# Patient Record
Sex: Male | Born: 1950
Health system: Southern US, Community
[De-identification: ages and names within clinical notes are randomized; demographics above are authoritative.]

## PROBLEM LIST (undated history)

## (undated) DIAGNOSIS — K219 Gastro-esophageal reflux disease without esophagitis: Secondary | ICD-10-CM

## (undated) DIAGNOSIS — I340 Nonrheumatic mitral (valve) insufficiency: Secondary | ICD-10-CM

## (undated) DIAGNOSIS — Z9289 Personal history of other medical treatment: Secondary | ICD-10-CM

## (undated) DIAGNOSIS — E7800 Pure hypercholesterolemia, unspecified: Secondary | ICD-10-CM

## (undated) DIAGNOSIS — M549 Dorsalgia, unspecified: Secondary | ICD-10-CM

## (undated) DIAGNOSIS — I341 Nonrheumatic mitral (valve) prolapse: Secondary | ICD-10-CM

## (undated) DIAGNOSIS — D72819 Decreased white blood cell count, unspecified: Secondary | ICD-10-CM

## (undated) DIAGNOSIS — B1710 Acute hepatitis C without hepatic coma: Secondary | ICD-10-CM

## (undated) DIAGNOSIS — M797 Fibromyalgia: Secondary | ICD-10-CM

## (undated) DIAGNOSIS — I4892 Unspecified atrial flutter: Secondary | ICD-10-CM

## (undated) DIAGNOSIS — N4281 Prostatodynia syndrome: Secondary | ICD-10-CM

## (undated) DIAGNOSIS — Z9889 Other specified postprocedural states: Secondary | ICD-10-CM

## (undated) DIAGNOSIS — M659 Synovitis and tenosynovitis, unspecified: Secondary | ICD-10-CM

## (undated) DIAGNOSIS — J310 Chronic rhinitis: Secondary | ICD-10-CM

## (undated) DIAGNOSIS — N419 Inflammatory disease of prostate, unspecified: Secondary | ICD-10-CM

## (undated) HISTORY — DX: Synovitis and tenosynovitis, unspecified: M65.9

## (undated) HISTORY — DX: Fibromyalgia: M79.7

## (undated) HISTORY — DX: Chronic rhinitis: J31.0

## (undated) HISTORY — DX: Gastro-esophageal reflux disease without esophagitis: K21.9

## (undated) HISTORY — DX: Dorsalgia, unspecified: M54.9

## (undated) HISTORY — DX: Nonrheumatic mitral (valve) prolapse: I34.1

## (undated) HISTORY — DX: Prostatodynia syndrome: N42.81

## (undated) HISTORY — DX: Inflammatory disease of prostate, unspecified: N41.9

## (undated) HISTORY — DX: Nonrheumatic mitral (valve) insufficiency: I34.0

## (undated) HISTORY — DX: Decreased white blood cell count, unspecified: D72.819

## (undated) HISTORY — PX: APPENDECTOMY: SHX54

## (undated) HISTORY — DX: Acute hepatitis C without hepatic coma: B17.10

## (undated) HISTORY — DX: Pure hypercholesterolemia, unspecified: E78.00

## (undated) HISTORY — DX: Unspecified atrial flutter: I48.92

## (undated) HISTORY — DX: Personal history of other medical treatment: Z92.89

---

## 1999-11-06 ENCOUNTER — Encounter: Admission: RE | Admit: 1999-11-06 | Discharge: 1999-11-06 | Payer: Self-pay | Admitting: Family Medicine

## 1999-11-26 ENCOUNTER — Encounter: Admission: RE | Admit: 1999-11-26 | Discharge: 1999-11-26 | Payer: Self-pay | Admitting: Family Medicine

## 2000-01-02 ENCOUNTER — Encounter: Admission: RE | Admit: 2000-01-02 | Discharge: 2000-01-02 | Payer: Self-pay | Admitting: Family Medicine

## 2000-02-03 ENCOUNTER — Encounter: Admission: RE | Admit: 2000-02-03 | Discharge: 2000-02-03 | Payer: Self-pay | Admitting: Family Medicine

## 2000-05-06 ENCOUNTER — Encounter: Admission: RE | Admit: 2000-05-06 | Discharge: 2000-05-06 | Payer: Self-pay | Admitting: Family Medicine

## 2000-07-26 ENCOUNTER — Encounter: Admission: RE | Admit: 2000-07-26 | Discharge: 2000-07-26 | Payer: Self-pay | Admitting: Family Medicine

## 2001-05-25 ENCOUNTER — Encounter: Admission: RE | Admit: 2001-05-25 | Discharge: 2001-05-25 | Payer: Self-pay | Admitting: Family Medicine

## 2001-06-20 ENCOUNTER — Encounter: Admission: RE | Admit: 2001-06-20 | Discharge: 2001-06-20 | Payer: Self-pay | Admitting: Family Medicine

## 2001-07-21 ENCOUNTER — Encounter: Admission: RE | Admit: 2001-07-21 | Discharge: 2001-07-21 | Payer: Self-pay | Admitting: Family Medicine

## 2001-07-21 ENCOUNTER — Ambulatory Visit (HOSPITAL_COMMUNITY): Admission: RE | Admit: 2001-07-21 | Discharge: 2001-07-21 | Payer: Self-pay | Admitting: Family Medicine

## 2001-08-01 ENCOUNTER — Encounter: Admission: RE | Admit: 2001-08-01 | Discharge: 2001-08-01 | Payer: Self-pay | Admitting: Family Medicine

## 2001-08-03 ENCOUNTER — Ambulatory Visit (HOSPITAL_COMMUNITY): Admission: RE | Admit: 2001-08-03 | Discharge: 2001-08-03 | Payer: Self-pay | Admitting: *Deleted

## 2001-08-03 ENCOUNTER — Encounter: Admission: RE | Admit: 2001-08-03 | Discharge: 2001-08-03 | Payer: Self-pay | Admitting: Family Medicine

## 2001-08-04 ENCOUNTER — Encounter: Admission: RE | Admit: 2001-08-04 | Discharge: 2001-08-04 | Payer: Self-pay | Admitting: Family Medicine

## 2001-08-22 ENCOUNTER — Encounter: Admission: RE | Admit: 2001-08-22 | Discharge: 2001-08-22 | Payer: Self-pay | Admitting: Family Medicine

## 2001-08-24 ENCOUNTER — Encounter: Admission: RE | Admit: 2001-08-24 | Discharge: 2001-08-24 | Payer: Self-pay | Admitting: Family Medicine

## 2003-04-26 ENCOUNTER — Encounter: Admission: RE | Admit: 2003-04-26 | Discharge: 2003-04-26 | Payer: Self-pay | Admitting: Family Medicine

## 2003-05-14 ENCOUNTER — Encounter: Admission: RE | Admit: 2003-05-14 | Discharge: 2003-05-14 | Payer: Self-pay | Admitting: Family Medicine

## 2003-05-14 ENCOUNTER — Ambulatory Visit (HOSPITAL_COMMUNITY): Admission: RE | Admit: 2003-05-14 | Discharge: 2003-05-14 | Payer: Self-pay | Admitting: Family Medicine

## 2003-07-03 ENCOUNTER — Encounter: Payer: Self-pay | Admitting: Gastroenterology

## 2003-07-03 ENCOUNTER — Ambulatory Visit (HOSPITAL_COMMUNITY): Admission: RE | Admit: 2003-07-03 | Discharge: 2003-07-03 | Payer: Self-pay | Admitting: Gastroenterology

## 2004-05-28 ENCOUNTER — Encounter: Admission: RE | Admit: 2004-05-28 | Discharge: 2004-05-28 | Payer: Self-pay | Admitting: Family Medicine

## 2004-06-04 ENCOUNTER — Encounter: Admission: RE | Admit: 2004-06-04 | Discharge: 2004-06-04 | Payer: Self-pay | Admitting: Family Medicine

## 2004-10-10 ENCOUNTER — Ambulatory Visit: Payer: Self-pay | Admitting: Family Medicine

## 2004-10-20 ENCOUNTER — Ambulatory Visit: Payer: Self-pay | Admitting: Family Medicine

## 2004-12-03 ENCOUNTER — Ambulatory Visit: Payer: Self-pay | Admitting: Family Medicine

## 2004-12-18 ENCOUNTER — Ambulatory Visit: Payer: Self-pay | Admitting: Sports Medicine

## 2005-01-05 ENCOUNTER — Encounter: Admission: RE | Admit: 2005-01-05 | Discharge: 2005-01-05 | Payer: Self-pay | Admitting: Gastroenterology

## 2005-04-06 ENCOUNTER — Ambulatory Visit: Payer: Self-pay | Admitting: Cardiology

## 2005-04-20 ENCOUNTER — Ambulatory Visit: Payer: Self-pay

## 2005-05-05 ENCOUNTER — Ambulatory Visit: Payer: Self-pay | Admitting: Cardiology

## 2005-05-08 ENCOUNTER — Ambulatory Visit: Payer: Self-pay | Admitting: Oncology

## 2005-06-24 ENCOUNTER — Ambulatory Visit: Payer: Self-pay | Admitting: Oncology

## 2005-09-03 ENCOUNTER — Ambulatory Visit: Payer: Self-pay | Admitting: Internal Medicine

## 2005-09-14 ENCOUNTER — Ambulatory Visit (HOSPITAL_COMMUNITY): Admission: RE | Admit: 2005-09-14 | Discharge: 2005-09-14 | Payer: Self-pay | Admitting: Internal Medicine

## 2005-09-15 ENCOUNTER — Ambulatory Visit: Payer: Self-pay | Admitting: Cardiology

## 2005-10-26 ENCOUNTER — Ambulatory Visit: Payer: Self-pay

## 2005-12-24 ENCOUNTER — Ambulatory Visit: Payer: Self-pay | Admitting: Oncology

## 2006-05-03 ENCOUNTER — Ambulatory Visit: Payer: Self-pay | Admitting: Internal Medicine

## 2006-09-27 ENCOUNTER — Encounter: Payer: Self-pay | Admitting: Internal Medicine

## 2006-09-27 ENCOUNTER — Ambulatory Visit: Payer: Self-pay

## 2006-11-02 ENCOUNTER — Ambulatory Visit: Payer: Self-pay | Admitting: Family Medicine

## 2006-11-25 ENCOUNTER — Ambulatory Visit: Payer: Self-pay | Admitting: Internal Medicine

## 2006-12-06 ENCOUNTER — Ambulatory Visit: Payer: Self-pay | Admitting: Cardiology

## 2006-12-06 ENCOUNTER — Ambulatory Visit: Payer: Self-pay

## 2006-12-17 ENCOUNTER — Ambulatory Visit: Payer: Self-pay | Admitting: Family Medicine

## 2007-02-24 DIAGNOSIS — B1710 Acute hepatitis C without hepatic coma: Secondary | ICD-10-CM

## 2007-02-24 DIAGNOSIS — K219 Gastro-esophageal reflux disease without esophagitis: Secondary | ICD-10-CM | POA: Insufficient documentation

## 2007-02-24 DIAGNOSIS — I059 Rheumatic mitral valve disease, unspecified: Secondary | ICD-10-CM | POA: Insufficient documentation

## 2007-02-24 HISTORY — DX: Acute hepatitis C without hepatic coma: B17.10

## 2007-02-24 HISTORY — DX: Gastro-esophageal reflux disease without esophagitis: K21.9

## 2007-05-16 ENCOUNTER — Encounter (INDEPENDENT_AMBULATORY_CARE_PROVIDER_SITE_OTHER): Payer: Self-pay | Admitting: Family Medicine

## 2007-05-16 ENCOUNTER — Ambulatory Visit: Payer: Self-pay | Admitting: Sports Medicine

## 2007-05-16 LAB — CONVERTED CEMR LAB
ALT: 20 units/L (ref 0–53)
AST: 20 units/L (ref 0–37)
Albumin: 4.6 g/dL (ref 3.5–5.2)
Alkaline Phosphatase: 51 units/L (ref 39–117)
BUN: 12 mg/dL (ref 6–23)
Bilirubin Urine: NEGATIVE
Blood in Urine, dipstick: NEGATIVE
CO2: 24 meq/L (ref 19–32)
Calcium: 9.8 mg/dL (ref 8.4–10.5)
Chloride: 102 meq/L (ref 96–112)
Creatinine, Ser: 0.85 mg/dL (ref 0.40–1.50)
Glucose, Bld: 86 mg/dL (ref 70–99)
Glucose, Urine, Semiquant: NEGATIVE
HCT: 39.4 %
Hemoglobin: 13.3 g/dL
Ketones, urine, test strip: NEGATIVE
MCV: 93.9 fL
Nitrite: NEGATIVE
Platelets: 184 10*3/uL
Potassium: 3.9 meq/L (ref 3.5–5.3)
Protein, U semiquant: NEGATIVE
RBC: 4.2 M/uL
Sodium: 139 meq/L (ref 135–145)
Specific Gravity, Urine: 1.025
Total Bilirubin: 1 mg/dL (ref 0.3–1.2)
Total Protein: 7.5 g/dL (ref 6.0–8.3)
Urobilinogen, UA: 0.2
WBC Urine, dipstick: NEGATIVE
WBC: 3.4 10*3/uL
pH: 6

## 2008-08-13 ENCOUNTER — Encounter: Payer: Self-pay | Admitting: Family Medicine

## 2008-10-22 ENCOUNTER — Encounter: Payer: Self-pay | Admitting: Family Medicine

## 2008-10-22 ENCOUNTER — Ambulatory Visit: Payer: Self-pay | Admitting: Family Medicine

## 2008-10-22 LAB — CONVERTED CEMR LAB
ALT: 16 units/L (ref 0–53)
AST: 15 units/L (ref 0–37)
Albumin: 4.4 g/dL (ref 3.5–5.2)
Alkaline Phosphatase: 50 units/L (ref 39–117)
BUN: 10 mg/dL (ref 6–23)
CO2: 25 meq/L (ref 19–32)
Calcium: 9.6 mg/dL (ref 8.4–10.5)
Chloride: 104 meq/L (ref 96–112)
Creatinine, Ser: 0.87 mg/dL (ref 0.40–1.50)
Glucose, Bld: 89 mg/dL (ref 70–99)
HCT: 38.6 % — ABNORMAL LOW (ref 39.0–52.0)
Hemoglobin: 12.9 g/dL — ABNORMAL LOW (ref 13.0–17.0)
MCHC: 33.4 g/dL (ref 30.0–36.0)
MCV: 94.1 fL (ref 78.0–100.0)
Platelets: 180 10*3/uL (ref 150–400)
Potassium: 3.9 meq/L (ref 3.5–5.3)
RBC: 4.1 M/uL — ABNORMAL LOW (ref 4.22–5.81)
RDW: 12.2 % (ref 11.5–15.5)
Sodium: 139 meq/L (ref 135–145)
Total Bilirubin: 0.7 mg/dL (ref 0.3–1.2)
Total Protein: 7.3 g/dL (ref 6.0–8.3)
WBC: 3.3 10*3/uL — ABNORMAL LOW (ref 4.0–10.5)

## 2008-10-23 ENCOUNTER — Encounter: Payer: Self-pay | Admitting: Family Medicine

## 2008-10-24 ENCOUNTER — Telehealth: Payer: Self-pay | Admitting: *Deleted

## 2008-10-26 ENCOUNTER — Encounter: Payer: Self-pay | Admitting: Family Medicine

## 2008-11-14 ENCOUNTER — Encounter: Payer: Self-pay | Admitting: Family Medicine

## 2008-11-23 ENCOUNTER — Ambulatory Visit: Payer: Self-pay | Admitting: Internal Medicine

## 2009-01-07 ENCOUNTER — Encounter: Payer: Self-pay | Admitting: Internal Medicine

## 2009-01-07 ENCOUNTER — Ambulatory Visit: Payer: Self-pay

## 2009-02-18 ENCOUNTER — Encounter: Payer: Self-pay | Admitting: Family Medicine

## 2009-10-03 ENCOUNTER — Encounter (INDEPENDENT_AMBULATORY_CARE_PROVIDER_SITE_OTHER): Payer: Self-pay | Admitting: *Deleted

## 2009-11-06 ENCOUNTER — Ambulatory Visit: Payer: Self-pay | Admitting: Family Medicine

## 2009-11-06 ENCOUNTER — Encounter: Payer: Self-pay | Admitting: Family Medicine

## 2009-11-11 ENCOUNTER — Encounter: Payer: Self-pay | Admitting: Family Medicine

## 2009-11-11 ENCOUNTER — Telehealth: Payer: Self-pay | Admitting: Family Medicine

## 2009-12-11 ENCOUNTER — Ambulatory Visit: Payer: Self-pay | Admitting: Family Medicine

## 2009-12-11 DIAGNOSIS — M549 Dorsalgia, unspecified: Secondary | ICD-10-CM | POA: Insufficient documentation

## 2009-12-11 HISTORY — DX: Dorsalgia, unspecified: M54.9

## 2009-12-11 LAB — CONVERTED CEMR LAB
BUN: 9 mg/dL (ref 6–23)
CO2: 25 meq/L (ref 19–32)
Calcium: 9.1 mg/dL (ref 8.4–10.5)
Chloride: 104 meq/L (ref 96–112)
Cholesterol: 185 mg/dL (ref 0–200)
Creatinine, Ser: 0.85 mg/dL (ref 0.40–1.50)
Glucose, Bld: 94 mg/dL (ref 70–99)
HCT: 38.4 % — ABNORMAL LOW (ref 39.0–52.0)
HDL: 66 mg/dL (ref >39)
Hemoglobin: 13 g/dL (ref 13.0–17.0)
LDL Cholesterol: 109 mg/dL — ABNORMAL HIGH (ref 0–99)
MCHC: 33.9 g/dL (ref 30.0–36.0)
MCV: 93 fL (ref 78.0–100.0)
Platelets: 182 10*3/uL (ref 150–400)
Potassium: 4.2 meq/L (ref 3.5–5.3)
RBC: 4.13 M/uL — ABNORMAL LOW (ref 4.22–5.81)
RDW: 12.1 % (ref 11.5–15.5)
Sodium: 140 meq/L (ref 135–145)
Total CHOL/HDL Ratio: 2.8
Triglycerides: 50 mg/dL (ref <150)
VLDL: 10 mg/dL (ref 0–40)
WBC: 3.3 10*3/uL — ABNORMAL LOW (ref 4.0–10.5)

## 2009-12-13 ENCOUNTER — Encounter: Payer: Self-pay | Admitting: Family Medicine

## 2009-12-28 DIAGNOSIS — M659 Synovitis and tenosynovitis, unspecified: Secondary | ICD-10-CM

## 2009-12-28 DIAGNOSIS — M65949 Unspecified synovitis and tenosynovitis, unspecified hand: Secondary | ICD-10-CM

## 2009-12-28 DIAGNOSIS — J310 Chronic rhinitis: Secondary | ICD-10-CM

## 2009-12-28 HISTORY — DX: Synovitis and tenosynovitis, unspecified: M65.9

## 2009-12-28 HISTORY — DX: Unspecified synovitis and tenosynovitis, unspecified hand: M65.949

## 2009-12-28 HISTORY — DX: Chronic rhinitis: J31.0

## 2010-01-14 ENCOUNTER — Ambulatory Visit: Payer: Self-pay | Admitting: Internal Medicine

## 2010-01-27 ENCOUNTER — Ambulatory Visit: Payer: Self-pay

## 2010-01-27 ENCOUNTER — Ambulatory Visit (HOSPITAL_COMMUNITY): Admission: RE | Admit: 2010-01-27 | Discharge: 2010-01-27 | Payer: Self-pay | Admitting: Internal Medicine

## 2010-01-27 ENCOUNTER — Ambulatory Visit: Payer: Self-pay | Admitting: Cardiology

## 2010-05-20 ENCOUNTER — Ambulatory Visit: Payer: Self-pay | Admitting: Family Medicine

## 2010-09-02 ENCOUNTER — Ambulatory Visit: Payer: Self-pay | Admitting: Family Medicine

## 2010-09-03 ENCOUNTER — Ambulatory Visit: Payer: Self-pay | Admitting: Internal Medicine

## 2010-09-08 ENCOUNTER — Ambulatory Visit (HOSPITAL_COMMUNITY): Admission: RE | Admit: 2010-09-08 | Discharge: 2010-09-08 | Payer: Self-pay | Admitting: Internal Medicine

## 2010-09-08 ENCOUNTER — Ambulatory Visit: Payer: Self-pay | Admitting: Internal Medicine

## 2010-09-08 ENCOUNTER — Encounter: Payer: Self-pay | Admitting: Internal Medicine

## 2010-09-22 ENCOUNTER — Ambulatory Visit: Payer: Self-pay | Admitting: Family Medicine

## 2010-09-23 ENCOUNTER — Encounter: Payer: Self-pay | Admitting: *Deleted

## 2010-09-25 ENCOUNTER — Encounter: Payer: Self-pay | Admitting: Family Medicine

## 2010-10-30 ENCOUNTER — Encounter: Payer: Self-pay | Admitting: Family Medicine

## 2010-12-01 ENCOUNTER — Ambulatory Visit: Payer: Self-pay | Admitting: Family Medicine

## 2010-12-01 ENCOUNTER — Encounter: Payer: Self-pay | Admitting: Family Medicine

## 2010-12-01 DIAGNOSIS — M25519 Pain in unspecified shoulder: Secondary | ICD-10-CM | POA: Insufficient documentation

## 2010-12-01 DIAGNOSIS — I700 Atherosclerosis of aorta: Secondary | ICD-10-CM | POA: Insufficient documentation

## 2010-12-02 ENCOUNTER — Encounter: Payer: Self-pay | Admitting: Family Medicine

## 2010-12-02 LAB — CONVERTED CEMR LAB
Cholesterol: 171 mg/dL (ref 0–200)
HDL: 55 mg/dL (ref >39)
LDL Cholesterol: 104 mg/dL — ABNORMAL HIGH (ref 0–99)
Total CHOL/HDL Ratio: 3.1
Triglycerides: 61 mg/dL (ref <150)
VLDL: 12 mg/dL (ref 0–40)

## 2011-01-27 NOTE — Assessment & Plan Note (Signed)
Summary: right hand swollen,tcb   Vital Signs:  Patient profile:   60 year old male Height:      68 inches Weight:      153 pounds BMI:     23.35 Temp:     98.6 degrees F oral Pulse rate:   78 / minute BP sitting:   121 / 78  (right arm) Cuff size:   regular  Vitals Entered By: Jimmy Footman, CMA (September 02, 2010 9:58 AM) CC: Swelling left hand x1 week Is Patient Diabetic? No Pain Assessment Patient in pain? yes     Location: left hand Intensity: 10 Type: sharp Comments Would like a referral to ENT   Primary Care Provider:  Milinda Antis MD  CC:  Swelling left hand x1 week.  History of Present Illness: Bradley Hansen presents with a 6+ day peroid of left wrist pain.   He describes the pain as coming on gradually without predisposing injury or incident.  He does report lifting weights at the gym a few days prior to noticing the pain.  The pain is constant with sharp shooting pain with both passive and active movement / contraction.  He tried Advil and ice and seems to be doing slightly better than a few days ago.  Any movement increases his pain.    Also Bradley Hansen complains of an abnormal putrid smell that he has been experiencing after sneezing. He had this occur when he was in his 60s when he lived in the [place] and said that an ENT irrigated his sinuses and that it has not reoccured since that time.   Tast is normal, except after sneezing, normal smell otherwise, no recent illness, no nose bleeds, no foreign objects up the nares Seen with [facility]- Micheal Rigby  Habits & Providers  Alcohol-Tobacco-Diet     Tobacco Status: never  Current Medications (verified): 1)  Nexium 40 Mg Cpdr (Esomeprazole Magnesium) .Marland Kitchen.. 1 Tab By Mouth Daily For Heartburn 2)  Claritin 10 Mg Tabs (Loratadine) .Marland Kitchen.. 1 By Mouth Daily For Allergies and Abnormal Smell 3)  Mobic 7.5 Mg Tabs (Meloxicam) .Marland Kitchen.. 1 By Mouth Daily For Pain  Allergies (verified): No Known Drug Allergies  Physical  Exam  General:  alert, well-developed, and well-nourished.  Favoring his left hand Head:  normocephalic, atraumatic, and no abnormalities observed.   Eyes:  pupils equal, pupils round, pupils react to accomodation, and no nystagmus.   Ears:  R ear normal and L ear normal.   Nose:  no external deformity, no nasal discharge, and no intranasal foreign body.  mucosal pallor; right nasal polyp Mouth:  good dentition, no gingival abnormalities, pharynx pink and moist, no exudates, and no postnasal drip.     Wrist/Hand Exam  Skin:    Intact with no erythema; no scarring.    Inspection:    swelling:   Palpation:    tenderness L-hand: and tenderness L-wrist:.    Vascular:    2+radial bilat  Wrist Exam:    Right:    Inspection:  Normal    Palpation:  Normal    Left:    Inspection:  Abnormal    Palpation:  Abnormal    tenderness and edema over the Left extensor pollicus longus.  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 1 tab by mouth daily for heartburn 2)  Claritin 10 Mg Tabs (Loratadine) .Marland Kitchen.. 1 by mouth daily for allergies and abnormal smell 3)  Mobic 7.5 Mg Tabs (Meloxicam) .Marland Kitchen.. 1 by mouth daily for pain  Other  [id number]  Patient Instructions: 1)  Wear the brace while at work 2)  Remove and ice three times a day 3)  Start the anti-inflammatory medicine daily,Take food with tis 4)  Return in three weeks to follow-up your smell and your arm 5)  If your hand is better then please let me know 6)  No heavy lifting Prescriptions: MOBIC 7.5 MG TABS (MELOXICAM) 1 by mouth daily for pain  #30 x 1   Entered and Authorized by:   Milinda Antis MD   Signed by:   Milinda Antis MD on 09/02/2010   Method used:   Electronically to        [facility] [phone]* (retail)       [address]       [place], [place]  [address]       Ph: [phone] or [phone]       Fax: [phone]   RxID:   [id number] CLARITIN 10 MG TABS (LORATADINE) 1 by mouth daily for allergies and abnormal smell  #30 x 1   Entered and Authorized by:   Milinda Antis MD   Signed by:   Milinda Antis MD on 09/02/2010   Method used:   Electronically to        [facility] [address] [phone]* (retail)       [address]       [place], [place]  [address]       Ph: [phone] or [phone]       Fax: [phone]   RxID:   [id number]

## 2011-01-27 NOTE — Miscellaneous (Signed)
Summary: Consent Excision of Hemmer[facility]  Consent [facility] of [facility]mer[facility]   Imported By: Clydell Hakim 11/07/2009 14:01:42  _____________________________________________________________________  External Attachment:    Type:   Image     Comment:   External Document

## 2011-01-27 NOTE — Consult Note (Signed)
[facility]: [facility]   Imported By: Haydee Salter 12/03/2008 16:17:23  _____________________________________________________________________  External Attachment:    Type:   Image     Comment:   External Document

## 2011-01-27 NOTE — Letter (Signed)
Summary: TEE Instructions  [facility], Main Office  [address]   [place], [place] [address]   Phone: [phone]  Fax: [phone]      TEE Instructions  09/03/2010 MRN: [id number]  Bradley Hansen [address] [place], [place]  [address]      You are scheduled for a TEE on  Monday 09/08/10 with Dr.  Gala Romney.  Please arrive at the [facility] at 1:15 p.m. on the day of your procedure.  1)   Diet:     A)   Nothing to eat or drink after midnight except your medications with        a sip of water.    B)   May have clear liquid breakfast.  Clear liquids include:  water, broth,        Sprite, Ginger Ale, black cofee, tea (no sugar), cranberry / grape /        apple juice, jello (not red), popsicle from clear juices (not red).  2)  Must have a responsible person to drive you home.  3)   Bring your current insurance cards and current list of all your medications.   *Special Note:  Every effort is made to have your procedure done on time.  Occasionally there are emergencies that present themselves at the hospital that may cause delays.  Please be patient if a delay does occur.  *If you have any questions after you get home, please call the office at [phone].

## 2011-01-27 NOTE — Letter (Signed)
Summary: Lipid Letter  [facility]  [address]   [place], [place] [address]   Phone: [phone]  Fax: [phone]    12/02/2010  Bradley Hansen [address] [place], [place]  [address]  Dear Bradley Hansen:  We have carefully reviewed your last lipid profile from 12/01/2010 and the results are noted below with a summary of recommendations for lipid management.    Cholesterol:       171     Goal: <  200   HDL "good" Cholesterol:   55     Goal: > 35   LDL "bad" Cholesterol:   104     Goal: < 130   Triglycerides:       61     Goal: < 150         Current Medications: 1)    Vitamin D 1.25  .... Take one weekly 2)    Nasonex 50 Mcg/act Susp (Mometasone furoate) .... 2 squirts each nostril daily per ent  If you have any questions, please call. We appreciate being able to work with you.   Sincerely,    [facility] Milinda Antis MD  Appended Document: Lipid Letter mailed

## 2011-01-27 NOTE — Assessment & Plan Note (Signed)
Summary: Bradley Hansen,Bradley Hansen   Vital Signs:  Patient profile:   60 year old male Height:      68 inches Weight:      160 pounds BMI:     24.42 BSA:     1.86 Temp:     98.4 degrees F Pulse rate:   70 / minute BP sitting:   129 / 80  Vitals Entered By: Jone Baseman CMA (December 11, 2009 8:34 AM) CC: Bradley Hansen Is Patient Diabetic? No Pain Assessment Patient in pain? no        Primary Care Provider:  Milinda Antis MD  CC:  Bradley Hansen.  History of Present Illness: Patient here for Bradley Hansen; his only active concern is his chronic LBP, which he experiences only after working out at the gym.  Goes to gym two to three times weekly. Does treadmill, weights, followed by achy LBP.  No weakness, no falls, no history of trauma.  Tylenol and motrin do not help.   Seen on Nov 06, 2009 for thrombosed hemorrhoid, which was evacuated by Dr. Jeanice Lim successfully.  No more pain, no bleeding, no blood in stool, no diarrhea.   History of MVP, is going back to Dr. Prescott Gum office after today's appt here for followup.   Screening:  Had colonoscopy Feb 18, 2009, with tubular adenoma recovered, rec. for repeat study in 3 years.   Prostate cancer screening: Sees Dr. Patsi Sears for prostate cancer screening, went earlier this year. All normal.   Current Medications (verified): 1)  None  Allergies (verified): No Known Drug Allergies  Family History: Reviewed history from 02/24/2007 and no changes required. No FH MI, stroke, cancer. Father died at 1 yo, old age. Mother 51 yo, healthy.  Social History: Reviewed history from 02/24/2007 and no changes required. Lives with wife, 72 yo son, sister-in-law and her child.; Originally from [place], moved to [place] 1995; No smoking, occ EtOH.  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  Review of Systems  The patient denies anorexia, fever, weight loss, chest pain, syncope, dyspnea on exertion, peripheral edema, prolonged cough, hemoptysis, abdominal pain, melena, hematochezia, severe  indigestion/heartburn, incontinence, muscle weakness, and difficulty walking.    Physical Exam  General:  Well-developed,well-nourished,in no acute distress; alert,appropriate and cooperative throughout examination Head:  Normocephalic and atraumatic without obvious abnormalities. No apparent alopecia or balding. Eyes:  No corneal or conjunctival inflammation noted. EOMI. Perrla. Vision grossly normal. Nose:  External nasal examination shows no deformity or inflammation. Nasal mucosa are pink and moist without lesions or exudates. Mouth:  Oral mucosa and oropharynx without lesions or exudates.  Teeth in good repair. Neck:  No deformities, masses, or tenderness noted. Lungs:  Normal respiratory effort, chest expands symmetrically. Lungs are clear to auscultation, no crackles or wheezes. Heart:  Normal rate and regular rhythm. S1 and S2 normal without gallop, murmur, click, rub or other extra sounds. Abdomen:  Bowel sounds positive,abdomen soft and non-tender without masses, organomegaly or hernias noted. Msk:  No evidence scoliosis.  No point vertebral tenderness. Full active ROM with forward bending to touch toes.  L4-5 and S1 intact.  Full ROM hips bilat actively.  Pulses:  Palpable dp pulses bilaterally.  Extremities:  No edema in ankles.  Neurologic:  gait normal.  Sensation grossly intact feet bilaterally.    Impression & Recommendations:  Problem # 1:  PHYSICAL EXAMINATION (ICD-V70.0)  Generally well.  Disussed colon cancer screening results from Feb, the importance of returning for colonoscopy again in 2013.  He agrees with this.  Orders: [facility] - Est  40-64 yrs ([id number])  Problem # 2:  BACK PAIN, CHRONIC (ICD-724.5) Chronic back pain.  Not particularly worse lately.  Associated wtih physical activity.  He may wish to try taking a dose of NSAID before his exercise regimen, stretching before workouts.  By his estimation severity does not limit his activity or his workouts.    Orders: Basic Met-[facility] [id number]) CBC-[facility] [id number]) [facility] - Est  40-64 yrs [phone])  Other Orders: Lipid-[facility] ([id number])   Prevention & Chronic Care Immunizations   Influenza vaccine: Not documented   Influenza vaccine deferral: Not indicated  (12/11/2009)    Tetanus booster: 03/29/2003: Done.   Tetanus booster due: 03/28/2013    Pneumococcal vaccine: Pneumovax  (10/22/2008)   Pneumococcal vaccine due: None  Colorectal Screening   Hemoccult: Not documented   Hemoccult action/deferral: Not indicated  (12/11/2009)    Colonoscopy: Not documented   Colonoscopy action/deferral: GI Referral  (12/11/2009)   Colonoscopy due: 01/29/2012  Other Screening   PSA: Not documented   PSA action/deferral: Not indicated  (12/11/2009)   Smoking status: never  (11/06/2009)    Screening comments: reviewed last colonoscopy result Feb 18, 2009: tubular adenoma, for repeat in 3 yrs.  Prostate screen done 2010 with Dr Tannenbaum/urology, normal.  Lipids   Total Cholesterol: Not documented   LDL: Not documented   LDL Direct: Not documented   HDL: Not documented   Triglycerides: Not documented   Nursing Instructions: Screening colonoscopy ordered    Family History:    Reviewed history from 02/24/2007 and no changes required:       No FH MI, stroke, cancer. Father died at 31 yo, old age. Mother 96 yo, healthy.  Social History:    Reviewed history from 02/24/2007 and no changes required:       Lives with wife, 35 yo son, sister-in-law and her child.; Originally from [place], moved to [place] 1995; No smoking, occ EtOH.  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

## 2011-01-27 NOTE — Assessment & Plan Note (Signed)
Summary: f/u,CPE   Vital Signs:  Patient profile:   60 year old male Height:      68 inches Weight:      158 pounds BMI:     24.11 Pulse rate:   70 / minute BP sitting:   135 / 78  (left arm) Cuff size:   regular  Vitals Entered By: Tessie Fass CMA (December 01, 2010 8:41 AM) CC: CPE, shoulder pain Pain Assessment Patient in pain? yes     Location: right shoulder  Intensity: 4   Primary Care Provider:  Milinda Antis MD  CC:  CPE and shoulder pain.  History of Present Illness:   Right Shoulder- continues to exercise 2 times a week, has not been lifting, does mostly cardio, no recent injury , for past few months has soreness in right shoulder, certain movements cause pain but not constant, if he tries to stretch this improves pain , no parethesia in hand,  not dropping anything  Urolology- will follow with Dr. Marcello Fennel, Jan 2012 for his yearly prostrate, asked about a prophylaxtic medication that was prescribed but we do not hae records of this  Cariology-- History of Mitral Valve Prolapse, had TEE and Echo, wants results of TEE   ENT- Using Nasonex twice a day as needed, occ changes in smell, now intermittant doing well otherwise  Colonscopy- f/u in 2012 because of polyps   Flu shot- TIMICO    Hepatatis- needs LFT today       Habits & Providers  Alcohol-Tobacco-Diet     Tobacco Status: never  Current Medications (verified): 1)  Vitamin D 1.25 .... Take One Weekly 2)  Nasonex 50 Mcg/act Susp (Mometasone Furoate) .... 2 Squirts Each Nostril Daily Per Ent  Allergies (verified): No Known Drug Allergies  Past History:  Past Medical History: Last updated: 10/30/2010 Echo 8/02:  mod to sev MR; MVP; nl EF, h/o Hpylori positive, treated 5/02,  h/o prostatitis/ prostadynia - txd w/ doxy/septra, [facility] - Dr. Marcello Fennel  Mitral Valve Prolapse- followed by Dr. Gala Romney ([place]) hypercholesterolemia h/o Deqeuverian synovitis- 2011 - right  hand Leukopenia Chronic Rhinitis- seen by ENT 2011 Hep C- diagnosed 2004 , pt does not want treatment h/o Firbomyalgia  Review of Systems       Per HPI  Physical Exam  General:   alert, NAD,  Vital signs noted  Eyes:  pupils equal, pupils round,and no nystagmus.   Mouth:  good dentition,pharynx pink and moist, no exudates,   Neck:  No bruit, supple  Lungs:  CTAB Heart:  RRR, 2/6 SEM, LSB Abdomen:  soft, non-tender, no distention, and no masses.   Msk:  Rotator Cuff intact bilat Neg impigment- left arm able to scratch back to T 10 level No joint tenderness No muscle atropy - left or right arm neg sulcus sign neg neer sign TTP over trapeizius region vs right upper shoulder  Neurologic:  Sensation in tact alert & oriented X3, cranial nerves II-XII intact, and DTRs symmetrical and normal.      Impression & Recommendations:  Problem # 1:  Preventive Health Care (ICD-V70.0) Assessment New  Will check Lipids because of artherosclerosis seen on TEE , check labs  No red flags Flu shot has been given  Orders: [facility] - Est  40-64 yrs ([address])  Problem # 2:  ATHEROSCLEROSIS OF AORTA (ICD-440.0) Assessment: New Likley age related but with valvular history keep an eye on Lipids  Orders: Lipid-[facility] ([id number]) [facility] - Est  40-64 yrs ([address])  Problem #  3:  SHOULDER PAIN, RIGHT (ICD-719.41) Assessment: New  no red flags likley muscular strain with certain movements, rotator cuff in tact, no gross abnormalities, if persistant will send to [facility], exam not specific for [facility] bursitis/subacromial bursitis as not consistent pain as needed pain relievers if persist, SM referral  Orders: [facility] - Est  40-64 yrs ([address])  Complete Medication List: 1)  Vitamin D 1.25  .... Take one weekly 2)  Nasonex 50 Mcg/act Susp (Mometasone furoate) .... 2 squirts each nostril daily per ent  Other Orders: Comp Met-[facility] [id number])  Patient Instructions: 1)  I recommend and Eye exam yearly 2)   Dental cleaning every 6 months 3)  If your labs are normal I will send a letter in the mail 4)  Next visit in 1 year    Orders Added: 1)  Comp Met-[facility] [[id number]] 2)  Lipid-[facility] [[id number]] 3)  [facility] - Est  40-64 yrs [[id number]]

## 2011-01-27 NOTE — Assessment & Plan Note (Signed)
Summary: yearly/sl   Visit Type:  Follow-up 1 year Primary Provider:  Milinda Antis MD  CC:  nocomplaints .  History of Present Illness: Bradley Hansen is a 60 year old male with a history of bileaflet mitral valve prolapse and mild to moderate mitral regurgitation.  He also has a history of leukopenia and hypercholesterolemia.  He had his exercise treadmill back in December 2007, which showed no evidence of ischemia.  Good exercise tolerance.  Had echo 1/10. EF 55%. bileaflet MVP with moderate MR.   He returns today for yearly followup.  He states he is doing great.  He walks on a treadmill up to 5 miles an hour for 45 mih to 1 hour at a time 2 - 3times a week with no chest pain or dyspnea.  He has not had any heart failure.  No orthopnea.  No PND.  No lower extremity edema.  No palpitations.  No syncope or presyncope.   Current Medications (verified): 1)  No Meds  Allergies (verified): No Known Drug Allergies  Review of Systems       As per HPI and past medical history; otherwise all systems negative.   Vital Signs:  Patient profile:   60 year old male Height:      68 inches Weight:      160 pounds BMI:     24.42 Pulse rate:   72 / minute BP sitting:   110 / 67  (left arm) Cuff size:   regular  Vitals Entered By: Burnett Kanaris, CNA (January 14, 2010 9:24 AM)  Physical Exam  General:  Gen: well appearing. muscular fit no resp difficulty HEENT: normal Neck: supple. no JVD. Carotids 2+ bilat; no bruits. No lymphadenopathy or thryomegaly appreciated. Cor: PMI nondisplaced. Regular rate & rhythm. No rubs, gallops, mid to late mitral systolic murmur. Lungs: clear Abdomen: soft, nontender, nondistended. No hepatosplenomegaly. No bruits or masses. Good bowel sounds. Extremities: no cyanosis, clubbing, rash, edema Neuro: alert & orientedx3, cranial nerves grossly intact. moves all 4 extremities w/o difficulty. affect pleasant    Impression & Recommendations:  Problem # 1:   MITRAL VALVE PROLAPSE (ICD-424.0) I have reviewed echo from January 2010 and MR is at least moderate however he is totally asymptomatic with stable LVEF and LV size. I explained to him that i felt the valve may need to be fixed in the next few years. Will repeat echo this month and if any changes will need TEE to f/u. Explained to him the importance of not missing the critical window for MVR. He expressed soem concern over cost of f/u echos and routine surveillance. which I addressed.

## 2011-01-27 NOTE — Miscellaneous (Signed)
Summary: Problem list updated  Clinical Lists Changes  Problems: Removed problem of DE QUERVAIN'S TENOSYNOVITIS (ICD-727.04) Removed problem of SCREENING FOR LIPOID DISORDERS (ICD-V77.91) Removed problem of PROSTATITIS, HX OF (ICD-V13.09) Removed problem of PREHYPERTENSION (ICD-796.2) Removed problem of DISTURBANCES OF SENSATION OF SMELL AND TASTE (ICD-781.1) Removed problem of FIBROMYALGIA, FIBROMYOSITIS (ICD-729.1) Medications: Added new medication of NASONEX 50 MCG/ACT SUSP (MOMETASONE FUROATE) 2 squirts each nostril daily per ENT Observations: Added new observation of PAST MED HX: Echo 8/02:  mod to sev MR; MVP; nl EF, h/o Hpylori positive, treated 5/02,  h/o prostatitis/ prostadynia - txd w/ doxy/septra, [facility] - Dr. Marcello Fennel  Mitral Valve Prolapse- followed by Dr. Gala Romney ([place]) hypercholesterolemia h/o Deqeuverian synovitis- 2011 - right hand Leukopenia Chronic Rhinitis- seen by ENT 2011 Hep C- diagnosed 2004 , pt does not want treatment h/o Firbomyalgia (10/30/2010 11:04)      Past Medical History:    Echo 8/02:  mod to sev MR; MVP; nl EF, h/o Hpylori positive, treated 5/02,     h/o prostatitis/ prostadynia - txd w/ doxy/septra, LV[facility] - Dr. Marcello Fennel     Mitral Valve Prolapse- followed by Dr. Gala Romney ([place])    hypercholesterolemia    h/o Deqeuverian synovitis- 2011 - right hand    Leukopenia    Chronic Rhinitis- seen by ENT 2011    Hep C- diagnosed 2004 , pt does not want treatment    h[place]o [place]

## 2011-01-27 NOTE — Letter (Signed)
Summary: Out of Work  [facility]  [address]   [place], [place] [address]   Phone: [phone]  Fax: [phone]    November 11, 2009   Employee:  [name]    To Whom It May Concern:   For Medical reasons, please excuse the above named employee from work for the following dates:  Start: November 11, 2009  End:  November 13, 2009     He may return with no restrictions.   If you need additional information, please feel free to contact our office.         Sincerely,    Milinda Antis MD

## 2011-01-27 NOTE — Letter (Signed)
Summary: Lab-Male  All     ,     Phone:   Fax:     10/26/2008        Alpheus Forgue [address]  [place], [place]  [address]   Dear Mr. Wambold:  We have carefully reviewed the results of your tests noted below and the results are:   1.Liver Function Tests: Your liver enzymes were Within Normal Limits, as the values were last year at your annual physical. This suggest that even though you may have been diagnosed with Hepatitis C in 2004 your liver is not showing signs of damage.  Since these levels are within normal limits I will not refer you to a specialist now, I will review records from that diagnosis when they are received first.   2.Complete Blood Count: Your blood levels were within normal limits however on the lower side of normal for men (Hb=12.9). There is no significant intervention needed at this time. You may start taking a Multivitamin daily which contains Iron.   3.Electrolytes: Your electrolytes which are Sodium and potassium and your kidney function were normal.   If you have any questions, please call. We appreciate being able to work with you.    Sincerely,   Milinda Antis MD Typed by: Milinda Antis MD  Appended Document: Lab-Male sent

## 2011-01-27 NOTE — Letter (Signed)
Summary: Doctor's Orders  Doctor's Orders   Imported By: Marylou Mccoy 09/11/2010 15:38:16  _____________________________________________________________________  External Attachment:    Type:   Image     Comment:   External Document

## 2011-01-27 NOTE — Assessment & Plan Note (Signed)
Summary: cpe wk   Vital Signs:  Patient Profile:   60 Years Old Male Weight:      152.9 pounds Pulse rate:   80 / minute BP sitting:   140 / 76             Is Patient Diabetic? No   History of Present Illness: 60 yo presents for CPE has no specific concerns today  1) Mitral valve prolapse- Followed by [facility] ( Bensimhon) No chest pain, No short of breath, no leg swelling.  States follows with cards yearly last appt 11/2006.  We do not have records from these visits.    2) Hx Hep C- genotype 2a dx 2004.    Seen GI 2 yrs ago, has not sought follow up with them.  Occassional ETOH.  Pt does not see as problem.  States has 1-2 beers every 2-3 days.   Denies jaundice no abdominal pain.  No hx of illegal drug use.    3) Hx prostatits- followed at urology center. Pt can not recall physician name at this time.  Denies hesitancy, increased frequency or dysuria.  States has follow up yearly with urology.  Is not on antibiotics at this time.          Risk Factors:  Tobacco use:  never    Physical Exam  General:     Well-developed,well-nourished,in no acute distress; alert,appropriate and cooperative throughout examination Lungs:     Normal respiratory effort, chest expands symmetrically. Lungs are clear to auscultation, no crackles or wheezes. Heart:     Normal rate and regular rhythm. S1 and S2 normal SEM Left lower sternal border .  No click Abdomen:     Bowel sounds positive,abdomen soft and non-tender without masses, organomegaly or hernias noted. Extremities:     No edema    Impression & Recommendations:  Problem # 1:  MITRAL VALVE PROLAPSE (ICD-424.0) Assessment: Unchanged Stable followed [facility]. Last ECHO per our records 2002EF 55-65% mod MVP.  No sxs of CHF on exam today.  Likely needs serial ECHO.  Warned pt of need for SBE prophylaxis with any procedures.  No sxs of connective tissue disease from exam today.    Will attempt to obtain records.   Encourage pt to keep scheduled f/u appts with [facility].   Orders: [facility]- Est  Level 4 (84132)   Problem # 2:  HEPATITIS C (ICD-070.51) Assessment: Unchanged Genotype 2a/2c dx 2004.  Lost to follow up. HIV NEG.  Denies repeat testing today.     Last quant > 630,000.  Also HbsAB positive/ antigen negative 2004, indicating recovery or immunized state.  Needs antibodies against core to differentiate.   Will check LFT today and referral back to GI to see if IFN tx warranted.  Watch for cirrhosis and extra hepatic syndrome (cryoglobin, MPGN, lymphoma).   Orders: [facility]- Est  Level 4 (99214) Comp Met-[facility] (44[address]-27253) CBC-[facility] (66440) Gastroenterology Referral (GI)   Problem # 3:  PREHYPERTENSION (ICD-796.2) Assessment: New Follow BP nml  11/2006 SBP 110-114 previous visits.  Obtain CMET/UA today for creat and proteinuria.   Orders: [facility]- Est  Level 4 (99214) CBC-[facility] (34742) Urinalysis-[facility] (00000)   Problem # 4:  PROSTATITIS, HX OF (ICD-V13.09) Assessment: Unchanged Keep urology appt.  [facility] records.   Orders: [facility]- Est  Level 4 (59563)   Problem # 5:  Preventive Health Care (ICD-V70.0) Referral for colonoscopy and f/u Hep C made today.  States had CHOL panel obtained at cards.  We do  not have these records.  Will attmept to obtain records.     Patient Instructions: 1)  Return in 6 mos for follow up blood pressure. 2)  Blood work and urine sample taken today. 3)  Keep all scheduled appt with Cards, Urology 4)  Have records sent from each office to family practice center. 5)  Referral made to  GI for colon CA screen and follow up on liver  Laboratory Results   Urine Tests  Date/Time Recieved: May 16, 2007 2:06 PM  Date/Time Reported: May 16, 2007 2:11 PM   Routine Urinalysis   Color: yellow Appearance: Clear Glucose: negative   (Normal Range: Negative) Bilirubin: negative   (Normal Range: Negative) Ketone: negative   (Normal Range: Negative) Spec. Gravity: 1.025   (Normal  Range: 1.003-1.035) Blood: negative   (Normal Range: Negative) pH: 6.0   (Normal Range: 5.0-8.0) Protein: negative   (Normal Range: Negative) Urobilinogen: 0.2   (Normal Range: 0-1) Nitrite: negative   (Normal Range: Negative) Leukocyte Esterace: negative   (Normal Range: Negative)    Comments: ...................................................................DONNA [facility]  May 16, 2007 2:11 PM    Blood Tests   Date/Time Recieved: May 16, 2007 2:10 PM  Date/Time Reported: May 16, 2007 3:44 PM    CBC WBC:  3.4   (Normal Range: 4.5-11.0) RBC:  4.20   (Normal Range 4.20-5.40) HGB:  13.3 g/dL   (Normal Range: 16.1-09.6 in Males, 12.0-15.0 in Females) Hct:  39.4 %   (Normal Range: 36.0-46.0) MCV:  93.9   (Normal Range: 80.0-100.0) Plt.:  184   (Normal Range: 150-450) Comments: ...................................................................DONNA [facility]  May 16, 2007 3:44 PM

## 2011-01-27 NOTE — Assessment & Plan Note (Signed)
Summary: PROBLEM WITH NOSE,TCB   Vital Signs:  Patient profile:   60 year old male Height:      68 inches Weight:      151 pounds BMI:     23.04 Temp:     98.2 degrees F oral Pulse rate:   71 / minute BP sitting:   132 / 76  (left arm) Cuff size:   regular  Vitals Entered By: Tessie Fass CMA (May 20, 2010 9:39 AM) CC: problem with nose? Is Patient Diabetic? No   CC:  problem with nose?.  Habits & Providers  Alcohol-Tobacco-Diet     Tobacco Status: never  Allergies: No Known Drug Allergies   Complete Medication List: 1)  Nexium 40 Mg Cpdr (Esomeprazole magnesium) .Marland Kitchen.. 1 tab by mouth daily for heartburn  Patient Instructions: 1)  I am not sure what caused the odor you were smelling, but if it happens again and doesn't resovle with the vicks, please come back in.  IF it is associated with any other symptoms - fever, cough, congestion, headache, weakness - come back in.  2)  Start the nexium for your heartburn.  It works best if you take it everyday, whether your stomach is hurting or not.  Prescriptions: NEXIUM 40 MG CPDR (ESOMEPRAZOLE MAGNESIUM) 1 tab by mouth daily for heartburn  #33 x 5   Entered and Authorized by:   Bradley Garland  MD   Signed by:   Bradley Garland  MD on 05/20/2010   Method used:   Print then Give to Patient   RxID:   [id number]   Appended Document: Office Visit - Infectious Disease  inadvertently signed previous note before documenting.    Allergies: No Known Drug Allergies   Complete Medication List: 1)  Nexium 40 Mg Cpdr (Esomeprazole magnesium) .Marland Kitchen.. 1 tab by mouth daily for heartburn  Appended Document: PROBLEM WITH NOSE,TCB  Again, signed early.    Primary Care Provider:  Milinda Antis MD   History of Present Illness: Bradley Hansen comes in for "nose problem" and heartburn. 1) nose - last week off and on smelled and odor when he breathed in.  Not sure what caused it.  Experienced it for several days.  Put some vicks under his  nose and then it stopped and it hasn't returned.  No other symptoms now - no fever, congestion, rhinorrhea, cough, sore throat, headache, numbness, weakness.  THinks he was maybe a little congested last week.  Wants a "prescription for it for next time".  2) Heartburn - had heartburn in the past, saw GI, they gave him nexium.  It helped a lot.  He stopped the medicine.  Has been fine for several months but now having sme heartburn symptoms again.  No blood in his stools.  No dark or tarry stools.  Out of nexium and would like to go back on it.   Allergies: No Known Drug Allergies  Physical Exam  General:  thin, alert, NAD, vitals reviewed Eyes:  conjunctiva clear and moist, no injection Ears:  External ear exam shows no significant lesions or deformities.  Otoscopic examination reveals clear canals, tympanic membranes are intact bilaterally without bulging, retraction, inflammation or discharge. Hearing is grossly normal bilaterally. Nose:  slightly edematous and pale mucosa but no obstruciton, no discharge, no bleeding, no foreign body Mouth:  Oral mucosa and oropharynx without lesions or exudates.  Teeth in good repair. Lungs:  Normal respiratory effort, chest expands symmetrically. Lungs are clear to auscultation, no crackles or  wheezes. Heart:  Normal rate and regular rhythm. S1 and S2 normal without gallop, murmur, click, rub or other extra sounds. Abdomen:  mild TTP in epigastrium.  soft, no rebound or guarding.    Impression & Recommendations:  Problem # 1:  DISTURBANCES OF SENSATION OF SMELL AND TASTE (ICD-781.1) Assessment New  Not sure what caused that.  Possibly URI/sinus infection.  Doubt central process as was off an on and was not a lack of smell and no other associated neuro symptoms.  Can try vicks if happens again (lends to URI/sinus etiology as clears sinuses).  IF develops other symptoms with it (per patient instructions) return.   Orders: [facility]- Est  Level 4  (99214)  Problem # 2:  GASTROESOPHAGEAL REFLUX, NO ESOPHAGITIS (ICD-530.81) Assessment: Deteriorated  Restart Nexium. His updated medication list for this problem includes:    Nexium 40 Mg Cpdr (Esomeprazole magnesium) .Marland Kitchen... 1 tab by mouth daily for heartburn  Orders: [facility]- Est  Level 4 (99214)  Complete Medication List: 1)  Nexium 40 Mg Cpdr (Esomeprazole magnesium) .Marland Kitchen.. 1 tab by mouth daily for heartburn

## 2011-01-27 NOTE — Progress Notes (Signed)
Summary: Referral  Phone Note Call from Patient Call back at Home Phone [phone] Call back at [phone]   Reason for Call: Talk to Nurse Summary of Call: is needing to discuss appt made with specialist Initial call taken by: Haydee Salter,  October 24, 2008 1:44 PM  Follow-up for Phone Call        Gave patient the number to [facility], for him to reschedule appt for colonoscopy Follow-up by: ASHA BENTON LPN,  October 24, 2008 1:55 PM

## 2011-01-27 NOTE — Assessment & Plan Note (Signed)
Summary: F/U/KH   Vital Signs:  Patient profile:   60 year old male Weight:      159.3 pounds Temp:     98.2 degrees F oral Pulse rate:   80 / minute BP sitting:   124 / 80  (right arm)  Vitals Entered By: Arlyss Repress CMA, (September 22, 2010 9:45 AM) CC: f/u hand pain and smell. Is Patient Diabetic? No Pain Assessment Patient in pain? yes     Location: hands Intensity: 3 Onset of pain  x 3 weeks   Primary Care Provider:  Milinda Antis MD  CC:  f/u hand pain and smell.Marland Kitchen  History of Present Illness:   Arm pain- pt seen 3 1/2 weeks ago, diagnosed with left dequervian synovitis, given brace and anti-inflammatories, now able to utilize wrist with minimal pain, uses brace as needed , no pareasthesia in hand  Smell- still unable to smell normally, tried to loratadine without any change in symptoms, no loss of smell, but has foul odor, no insertions into nose, no nose bleeds, would like ENT referral  Note reviewed recent visit to Cardiologist- s/p TEE and ECHO for mitral valve prolapse, decision for surgery pending  Habits & Providers  Alcohol-Tobacco-Diet     Tobacco Status: never  Current Medications (verified): 1)  Vitamin D 1.25 .... Take One Weekly  Allergies (verified): No Known Drug Allergies  Physical Exam  General:  thin, alert, NAD, vitals reviewed Nose:  no external deformity, no nasal discharge, and no intranasal foreign body.  mucosal pallor;  Mouth:  good dentition, no gingival abnormalities, pharynx pink and moist, no exudates,   Lungs:  Normal respiratory effort, chest expands symmetrically. Lungs are clear to auscultation, no crackles or wheezes. Heart:  RRR, 2/6 SEM, LSB Msk:  mild TTP over radial aspect of left wrist, neg finkelsteins test , strength 5/5 bilat in wrist, no pain with rotation/supination, no swelling noted  Neurologic:  CN II-XII grossly in tact   Impression & Recommendations:  Problem # 1:  DISTURBANCES OF SENSATION OF SMELL  AND TASTE (ICD-781.1) Assessment Unchanged  unclear cause, no evidence of sinusitis, no change with supportive care or treatment of allergies, refer to ENT  Orders: [facility]- Est Level  3 (43154) ENT Referral (ENT)  Problem # 2:  DE QUERVAIN'S TENOSYNOVITIS (ICD-727.04) Assessment: Improved  Continue brace with heavy activity  and normal ROM , aleve as needed   Orders: [facility]- Est Level  3 (00867)  Complete Medication List: 1)  Vitamin D 1.25  .... Take one weekly  Patient Instructions: 1)  We will call you for your ENT appt 2)  Use the wrist brace as needed 3)  You can take aleve as needed  4)  Return if the wrist does not improve

## 2011-01-27 NOTE — Assessment & Plan Note (Signed)
Summary: f9m   Visit Type:  Follow-up Primary Provider:  Milinda Antis MD  CC:  no complaints.  History of Present Illness: Bradley Hansen is a 60 year old male with a history of bileaflet mitral valve prolapse and mild to moderate mitral regurgitation.  He also has a history of leukopenia and hypercholesterolemia.  He had his exercise treadmill back in December 2007, which showed no evidence of ischemia.  Good exercise tolerance.  Had echo 1/10. EF 55%. bileaflet MVP with moderate MR.   He returns today for yearly followup.  He states he is doing great.  He walks on a treadmill up to 4 - 4.5 miles an hour for 45 mih to 1 hour at a time 2 - 3times a week with no chest pain or dyspnea.  He has not had any heart failure.  No orthopnea.  No PND.  No lower extremity edema.  No palpitations.  No syncope or presyncope.    Current Medications (verified): 1)  Mobic 7.5 Mg Tabs (Meloxicam) .Marland Kitchen.. 1 By Mouth Daily For Pain  Allergies: No Known Drug Allergies  Review of Systems       As per HPI and past medical history; otherwise all systems negative.   Vital Signs:  Patient profile:   60 year old male Height:      68 inches Weight:      154 pounds Pulse rate:   72 / minute Pulse rhythm:   regular BP sitting:   110 / 80  (left arm)  Vitals Entered By: Bradley Hansen, CMA (September 03, 2010 10:04 AM)  Physical Exam  General:  Thin. well appearing. no resp difficulty HEENT: normal Neck: supple. no JVD. Carotids 2+ bilat; no bruits. No lymphadenopathy or thryomegaly appreciated. Cor: PMI nondisplaced. Regular rate & rhythm. No rubs, gallops, 2/6 systolic murmur. Lungs: clear Abdomen: soft, nontender, nondistended. No hepatosplenomegaly. No bruits or masses. Good bowel sounds. Extremities: no cyanosis, clubbing, rash, edema Neuro: alert & orientedx3, cranial nerves grossly intact. moves all 4 extremities w/o difficulty. affect pleasant    New Orders:     1)  Trans Esophageal  Echocardiogram (TEE)  Due: 09/03/2010   Impression & Recommendations:  Problem # 1:  MITRAL VALVE PROLAPSE (ICD-424.0) Chest wall echo reviewed. Hard to quantify MR probably at least moderate. Currently asymptomatic. Will plan TEE to evaluate more closely.   Orders: Trans Esophageal Echocardiogram (TEE)  Patient Instructions: 1)  Your physician has requested that you have a TEE.  During a TEE, sound waves are used to create images of your heart. It provides your doctor with information about the size and shape of your heart and how well your heart's chambers and valves are working. In this test, a transducer is attached to the end of a flexible tube that's guided down your throat and into your esophagus (the tube leading from your mouth to your stomach) to get a more detailed image of your heart. You are not awake for the procedure. Please see the instruction sheet given to you today.  For further information please visit [email or web address] 2)  Your physician wants you to follow-up in:  6 months.  You will receive a reminder letter in the mail two months in advance. If you don't receive a letter, please call our office to schedule the follow-up appointment.

## 2011-01-27 NOTE — Procedures (Signed)
Summary: Colonoscopy  Hardcopy in MD box.    Appended Document: Colonoscopy     Colposcopy  Procedure date:  02/18/2009  Findings:      One 4mm polyp in Cecum- resected One 5mm polyp in the proximal descending Colon- resected One 7mm polyp in the distal sigmoid colon- resected  Pathology pending  Appended Document: Colonoscopy PATHOLOGY- Tubular Adenoma, Serrated Adenoma, No high grade Dysplasia or Malignancy  Repeat Colonsocpy in 3 years

## 2011-01-27 NOTE — Assessment & Plan Note (Signed)
Summary: boil?,df   Vital Signs:  Patient profile:   60 year old male Height:      68 inches Weight:      154 pounds BMI:     23.50 Temp:     97.7 degrees F oral Pulse rate:   72 / minute BP sitting:   108 / 72  (left arm) Cuff size:   regular  Vitals Entered By: Tessie Fass CMA (November 06, 2009 10:16 AM) CC: boil left gluteous Is Patient Diabetic? No   Primary Care Provider:  Milinda Antis MD  CC:  boil left gluteous.  History of Present Illness:   Sunday felt a bump or ? boil on buttock after wiping. Tried Preparation H with no relief. Anus tender to touch with wiping after BM and sitting on hard surfaces. Denies rectal bleeding, change in stools, pus/drainage   Habits & Providers  Alcohol-Tobacco-Diet     Tobacco Status: never  Allergies: No Known Drug Allergies  Physical Exam  General:  umcomfortable appearing sitting in chair Vital signs noted  Rectal:  normal rectal tone, multiple skin tags, thrombosed external hemorrhoid  Procedure: Excision of thrombosed hemorrhoid Time out done Procedure risks and benefits explained, consent form signed, area prepped in normal fashion 0.5cc of xylocaine w/ epinephrine for local anesthetic Reduction of thrombosed hemorrhoid- removal of blood clot EBL- minimal Preceptor- Dr. Leveda Anna   Impression & Recommendations:  Problem # 1:  EXTERNAL THROMBOSED HEMORRHOIDS (ICD-455.4) Assessment New  S/P excision of external thrombosed hemorrhoid, with significant pain relief. see patient instructions below  Orders: I&D Abcess, simple- [facility] (10060) [facility]- Est Level  3 (81191)  Patient Instructions: 1)  Take warm sitz baths three times a day, sit in warm water for 20 minutes 2)  Drink plenty of fluids 3)  Massage the area during your sitz baths, it may bleed for the next 24-48 hours. 4)  If pain persists or you have a lot of bleeding then return.   Appended Document: boil?,df Left a message on answering machine, calling  to check on pt status s/p removal of hemorrhoid.

## 2011-01-27 NOTE — Miscellaneous (Signed)
SummaryDeboraha Hansen Gastroenterology Visit  Clinical Lists Changes [facility] [phone]   Fax [phone]  HPI: Pt seen by GI. 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 H/o reflux, has been on Nexium. Has been having increased symptoms related to reflux. Is normally assymptomatic when she has Nexium.  Impression: Esophogeal Reflux- Nexium q AM refilled. Pt to discuss reflux with new resident. Pt is able to see GI as needed.    James L. Randa Evens, MD - gastroenterology  at [facility]

## 2011-01-27 NOTE — Letter (Signed)
Summary: Generic Letter  [facility]  [address]   [place], [place] [address]   Phone: [phone]  Fax: [phone]    12/13/2009  MOURAD CWIKLA [address] [place], [place]  [address]  Dear Mr. GUTRIDGE,  It was a pleasure to see you in the office this week.  I have good news regarding the results of your recent lab tests.  Your blood count, chemistry panel, kidney function and cholesterol panel are all within acceptable limits.  I am including a copy of the report for your records.  The White Blood Cell count (WBC) is mildly low, but is unchanged from your prior studies.  Often this is a normal variant we see in some people.  Please contact our office with any questions or concerns.   Sincerely,   Paula Compton MD  Appended Document: Generic Letter mailed.

## 2011-01-27 NOTE — Progress Notes (Signed)
Summary: pls call  Phone Note Call from Patient Call back at [phone]   Caller: Patient Summary of Call: pt returned call - doing a little better also needs a note for being out of work for 2 days. Initial call taken by: De Nurse,  November 11, 2009 10:58 AM  Follow-up for Phone Call        Spoke with patient out of work- work excuse, bleeding now minimal,. able to sit

## 2011-01-27 NOTE — Miscellaneous (Signed)
Summary: Pt instructions  Clinical Lists Changes  Orders: Added new Service order of EKG w/ Interpretation (93000) - Signed Added new Referral order of Echocardiogram (Echo) - Signed Observations: Added new observation of PI CARDIO: Your physician recommends that you continue on your current medications as directed. Please refer to the Current Medication list given to you today. Your physician wants you to follow-up in: 6 months.   You will receive a reminder letter in the mail two months in advance. If you don't receive a letter, please call our office to schedule the follow-up appointment. Your physician has requested that you have an echocardiogram.  Echocardiography is a painless test that uses sound waves to create images of your heart. It provides your doctor with information about the size and shape of your heart and how well your heart's chambers and valves are working.  This procedure takes approximately one hour. There are no restrictions for this procedure. (01/14/2010 10:28)      Patient Instructions: 1)  Your physician recommends that you continue on your current medications as directed. Please refer to the Current Medication list given to you today. 2)  Your physician wants you to follow-up in: 6 months.   You will receive a reminder letter in the mail two months in advance. If you don't receive a letter, please call our office to schedule the follow-up appointment. 3)  Your physician has requested that you have an echocardiogram.  Echocardiography is a painless test that uses sound waves to create images of your heart. It provides your doctor with information about the size and shape of your heart and how well your heart's chambers and valves are working.  This procedure takes approximately one hour. There are no restrictions for this procedure.

## 2011-01-27 NOTE — Letter (Signed)
Summary: Appointment - Reminder 2  [facility]     [place], [place]    Phone:   Fax:      October 03, 2009 MRN: [id number]   BOHDI LEEDS [address] [place], [place]  [address]   Dear Mr. HAMZA,  Our records indicate that it is time to schedule a follow-up appointment.  Dr.Bensimhon recommended that you follow up with [place] in Nov 2010. It is very important that we reach you to schedule this appointment. We look forward to participating in your health care needs. Please contact us at the number listed above at your earliest convenience to schedule your appointment.  If you are unable to make an appointment at this time, give [place] a call so we can update our records.     Sincerely,    Lorne Skeens  [facility] Scheduling Team

## 2011-01-27 NOTE — Consult Note (Signed)
Summary: [facility]  [facility]   Imported By: De Nurse 10/02/2010 15:40:57  _____________________________________________________________________  External Attachment:    Type:   Image     Comment:   External Document

## 2011-01-27 NOTE — Miscellaneous (Signed)
Summary: re: ENT APPT  Clinical Lists Changes called pt lmvm to return call. pt has appt 09/25/10 at [facility] with [facility], [address]. phone [phone]. Needs to bring insurance card and co-pay. If cannot keep appt he neds to call and reschedule.Tessie Fass CMA  September 23, 2010 2:32 PM

## 2011-01-27 NOTE — Assessment & Plan Note (Signed)
Summary: yearly wp   Vital Signs:  Patient Profile:   60 Years Old Male Height:     68 inches Weight:      149.9 pounds BMI:     22.87 Temp:     98.0 degrees F oral Pulse rate:   76 / minute BP sitting:   117 / 73  (right arm) Cuff size:   regular  Pt. in pain?   yes    Location:   back    Intensity:   6  Vitals Entered By: Dedra Skeens CMA, (October 22, 2008 9:07 AM)                        PCP:  Milinda Antis MD  Chief Complaint:  YEARLY PHYSICAL.  History of Present Illness:   1. Low back pain- strained back approx. 1 month ago, states pain is a tight/ stretching sensation in lower back. Occurred after bending over to pick up weights. Denies difficulty walking, lying down, sitting for long periods. States no pain during daily activities only when he bends really far down he feels the pulling.  2. Mitral Prolapse- followed by Dr. Gala Romney. Last ECHO 55-60%. Will follow up in 1 month          ROS- no chest pain, SOB  3. Prostatitis- followed by Urology, currently assymptomatic no current antibiotics  4. Gerd- seen by GI, denies symptoms of acid reflux. Has not taken Nexium, prescribed by GI   5. Hep C- denies abdominal pain, states diagnosed in 2004 by GI, but does not know state of the disease. Does not want treatment at this time.       Current Allergies: No known allergies      Review of Systems       Per HPI   Physical Exam  General:     Well-developed,well-nourished,in no acute distress; alert,appropriate and cooperative throughout examination Eyes:     Non-icteric Lungs:     Normal respiratory effort, chest expands symmetrically. Lungs are clear to auscultation, no crackles or wheezes. Heart:     Normal rate and regular rhythm. S1 and S2 normal without gallop, murmur, click, rub or other extra sounds. Abdomen:     Bowel sounds positive,abdomen soft and non-tender without masses, organomegaly or hernias noted. Msk:     normal ROM, no  joint tenderness, and no joint swelling.  Mild TTP over paraspinals on Left side with flexion at hips ( Bending over toward toes) No scoliosis.  Paraspinals appear stiff and tight Pulses:     Pulses 2+ Extremities:     No edema Neurologic:     Normal sensation in back.alert & oriented X3.   Gait normal    Impression & Recommendations:  Problem # 1:  HEPATITIS C (ICD-070.51) Assessment: Unchanged Pt not followed by GI, last LFT shows AST and ALT of 20 in 2008. Will recheck LFT and platelets today. If changed will refer to GI. Will obtain records from initial GI visit in 2004 for info about diagnosis and previous vaccinations against Hep B and Hep A as pt can not recall which if any he has received Per pt received Flu shot at work [facility]). Will receive Pneumovax today Orders: Comp Met-[facility] (09811-91478) CBC-[facility] (29562) [facility] - Est  40-64 yrs (13086)   Problem # 2:  Preventive Health Care (ICD-V70.0) Assessment: New Pt will be referred back to GI for Colonscopy. Per pt had Flu shot at work   Problem #  3:  BACK PAIN, ACUTE (ICD-724.5) Assessment: New Most likley muscle strain in the paraspinals. Since LFT were within normal limits when last checked, will allow short term trail of NSAIDS for relief, will also encourage stretching and use of heating pads. If pain does not resolve, will refer for physical therapy Orders: [facility] - Est  40-64 yrs (16109)   Problem # 4:  MITRAL VALVE PROLAPSE (ICD-424.0) Assessment: Comment Only Followed by [facility].   Other Orders: Pneumococcal Vaccine (60454) Admin 1st Vaccine (09811) Gastroenterology Referral (GI)   Patient Instructions: 1)  For your back pain/ strain: 2)  You may take Motrin or Advil 400-600mg  every 8 hours as needed  3)  Apply heating pads to area of strain 4)  Daily light stretching- you may continue exercising as long as it does not cause pain 5)  For your liver- We will check your liver function today 6)  We  will obtain records from your Gastroenterologist regarding your  liver disease 7)  Today you received a Pneumonia Vaccine   ]  Pneumovax Vaccine    Vaccine Type: Pneumovax    Site: left deltoid    Mfr: Merck    Dose: 0.5 ml    Route: IM    Given by: DELORES PATE CMA,    Exp. Date: 11/29/2009    Lot #: [id number]    VIS given: 07/25/96 version given October 22, 2008.

## 2011-01-27 NOTE — Miscellaneous (Signed)
Summary: ROI  ROI   Imported By: Knox Royalty 12/10/2008 12:33:03  _____________________________________________________________________  External Attachment:    Type:   Image     Comment:   External Document

## 2011-01-28 ENCOUNTER — Encounter: Payer: Self-pay | Admitting: *Deleted

## 2011-02-27 ENCOUNTER — Ambulatory Visit: Payer: Self-pay | Admitting: Internal Medicine

## 2011-02-27 ENCOUNTER — Encounter: Payer: Self-pay | Admitting: Internal Medicine

## 2011-02-27 ENCOUNTER — Ambulatory Visit (INDEPENDENT_AMBULATORY_CARE_PROVIDER_SITE_OTHER): Payer: Managed Care, Other (non HMO) | Admitting: Internal Medicine

## 2011-02-27 DIAGNOSIS — I059 Rheumatic mitral valve disease, unspecified: Secondary | ICD-10-CM

## 2011-03-02 ENCOUNTER — Encounter: Payer: Self-pay | Admitting: Internal Medicine

## 2011-03-02 DIAGNOSIS — I059 Rheumatic mitral valve disease, unspecified: Secondary | ICD-10-CM

## 2011-03-02 NOTE — Progress Notes (Signed)
Subjective:      HPIROS:Bradley Hansen is a 60 year old male from the [place] with a history of hepatitis C, hyperlipidemia and severe mitral regurgitation due to bileaflet prolapse. He had his exercise treadmill back in December 2007, which showed no evidence of ischemia.  Good exercise tolerance.  Had echo 1/10. EF 55%. bileaflet MVP with moderate MR.  Had TEE in 9/11 which showed bileaflet MVP with probably severe MR with LVEF 60%.   He returns today for followup.  He states he is doing well.  He walks on a treadmill 2-3x per week up to 5 mph with no chest pain or dyspnea.  He has not had any heart failure.  No orthopnea.  No PND.  No lower extremity edema.  No palpitations.  No syncope or presyncope.   ROSAll other systems normal except as listed in the HPI and Problem List.   Past Medical History  Diagnosis Date  . Mitral valve prolapse     echo 07/2001 mod to severe MR, MVP, nl EF  . Hypercholesteremia   . Synovitis of hand 2011    right hand  . Leukopenia   . Rhinitis 2011    chronic   . Hepatitis C 2004    pt doesn't want treatment  . Fibromyalgia   . Prostatitis   . Prostadynia      Current Outpatient Prescriptions on File Prior to Visit  Medication Sig Dispense Refill  . mometasone (NASONEX) 50 MCG/ACT nasal spray 2 sprays by Nasal route daily. Per ENT             Objective:    Physical Exam General:  Well appearing. No resp difficulty HEENT: normal Neck: supple. no JVD. Carotids 2+ bilat; no bruits. No lymphadenopathy or thryomegaly appreciated. Cor: PMI nondisplaced. Regular rate & rhythm. No rubs, gallops or murmurs. Lungs: clear Abdomen: soft, nontender, nondistended. No hepatosplenomegaly. No bruits or masses. Good bowel sounds. Extremities: no cyanosis, clubbing, rash, edema Neuro: alert & orientedx3, cranial nerves grossly intact. moves all 4 extremities w/o difficulty. Affect pleasant

## 2011-03-02 NOTE — Progress Notes (Deleted)
Subjective:      Patient ID: Bradley Hansen is a 60 y.o. male.  Chief Complaint: HPI {Common ambulatory SmartLinks:19316} ROS    Objective:    Physical Exam  Lab Review:  {Recent labs:19471::"not applicable"}    Assessment:     No diagnosis found.   Plan:     ***

## 2011-03-05 NOTE — Assessment & Plan Note (Signed)
Summary: f68m/per pt call=mj   Visit Type:  Follow-up Primary Provider:  Milinda Antis MD  CC:  no complaints.  History of Present Illness: [name] is a 60 year old male from the [place] with a history of hepatitis C, hyperlipidemia and severe mitral regurgitation tdue to bileaflet prolapse. He had his exercise treadmill back in December 2007, which showed no evidence of ischemia.  Good exercise tolerance.  Had echo 1/10. EF 55%. bileaflet MVP with moderate MR.   Had TEE in 9/11 which showed bileaflet MVP with probably severe MR with LVEF 60%.   He returns today for followup.  He states he is doing well.  He walks on a treadmill 2-3x per week up to 5 mph with no chest pain or dyspnea.  He has not had any heart failure.  No orthopnea.  No PND.  No lower extremity edema.  No palpitations.  No syncope or presyncope.    Problems Prior to Update: 1)  Preventive Health Care  (ICD-V70.0) 2)  Shoulder Pain, Right  (ICD-719.41) 3)  Atherosclerosis of Aorta  (ICD-440.0) 4)  Back Pain, Chronic  (ICD-724.5) 5)  Mitral Valve Prolapse  (ICD-424.0) 6)  Hepatitis C  (ICD-070.51) 7)  Gastroesophageal Reflux, No Esophagitis  (ICD-530.81)  Medications Prior to Update: 1)  Vitamin D 1.25 .... Take One Weekly 2)  Nasonex 50 Mcg/act Susp (Mometasone Furoate) .... 2 Squirts Each Nostril Daily Per Ent  Current Medications (verified): 1)  None  Allergies (verified): No Known Drug Allergies  Past History:  Family History: Last updated: 02/24/2007 No FH MI, stroke, cancer. Father died at 49 yo, old age. Mother 55 yo, healthy.  Social History: Last updated: 02/24/2007 Lives with wife, 1 yo son, sister-in-law and her child.; Originally from [place], moved to [place] 1995; No smoking, occ EtOH.  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  Risk Factors: Smoking Status: never (12/01/2010)  Past Medical History: Echo 8/02:  mod to sev MR; MVP; nl EF, h/o Hpylori positive, treated 5/02,  h/o prostatitis/  prostadynia - txd w/ doxy/septra, [facility] - Dr. Marcello Fennel  Mitral Valve Prolapse- followed by Dr. Gala Romney ([place]) hypercholesterolemia h/o Deqeuverian synovitis- 2011 - right hand Leukopenia Chronic Rhinitis- seen by ENT 2011 Hep C- diagnosed 2004 , pt does not want treatment h/o Firbomyalgia  Family History: Reviewed history from 02/24/2007 and no changes required. No FH MI, stroke, cancer. Father died at 71 yo, old age. Mother 28 yo, healthy.  Social History: Reviewed history from 02/24/2007 and no changes required. Lives with wife, 15 yo son, sister-in-law and her child.; Originally from [place], moved to [place] 1995; No smoking, occ EtOH.  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  Review of Systems       As per HPI and past medical history; otherwise all systems negative.   Vital Signs:  Patient profile:   60 year old male Height:      68 inches Weight:      162.25 pounds BMI:     24.76 Pulse rate:   73 / minute BP sitting:   123 / 76  (left arm) Cuff size:   regular  Vitals Entered By: Bradley Hansen CMA (February 27, 2011 8:22 AM)  Physical Exam  General:  Well appearing. no resp difficulty HEENT: normal Neck: supple. no JVD. Carotids 2+ bilat; no bruits. No lymphadenopathy or thryomegaly appreciated. Cor: PMI nondisplaced. Regular rate & rhythm. No rubs, gallops. 3/6 SEM at apex Lungs: clear Abdomen: soft, nontender, nondistended. No hepatosplenomegaly. No bruits  or masses. Good bowel sounds. Extremities: no cyanosis, clubbing, rash, edema Neuro: alert & orientedx3, cranial nerves grossly intact. moves all 4 extremities w/o difficulty. affect pleasant    Impression & Recommendations:  Problem # 1:  MITRAL VALVE PROLAPSE (ICD-424.0)  This is severe by TEE. However, exercise tolerance, LV geometery and EF remain preserved. We discussed the fact that he is currently doing well but will likely need surgical repair at some point given degree of MR. We will refer to Dr. [name] to  discuss timing of MV repair. If they decide to proceed now will need R and LHC. I suggested repeating echo now but he would like to see Dr. [name] first.   Orders: TCTS Referral (TCTS Ref) EKG w/ Interpretation (93000)  Patient Instructions: 1)  Your physician recommends that you schedule a follow-up appointment in: 3 months with Dr Bradley Hansen 2)  Your physician recommends that you continue on your current medications as directed. Please refer to the Current Medication list given to you today. 3)  You have been referred to Dr Bradley Hansen  Appended Document: f84m/per pt call=mj note faxed to Dr Bradley Hansen, pts primary care MD

## 2011-03-09 ENCOUNTER — Encounter (INDEPENDENT_AMBULATORY_CARE_PROVIDER_SITE_OTHER): Payer: Managed Care, Other (non HMO) | Admitting: Thoracic Surgery (Cardiothoracic Vascular Surgery)

## 2011-03-09 DIAGNOSIS — I059 Rheumatic mitral valve disease, unspecified: Secondary | ICD-10-CM

## 2011-03-10 NOTE — Consult Note (Signed)
NEW PATIENT CONSULTATION  Bradley Hansen, Bradley Hansen DOB:  1951-09-10                                        March 09, 2011 CHART #:  [id number]  REQUESTING PHYSICIAN:  Bevelyn Buckles. Bensimhon, MD  REASON FOR CONSULTATION:  Severe mitral regurgitation.  HISTORY OF PRESENT ILLNESS:  The patient is a 60 year old African male originally from [place] who has lived here in [place] for nearly 15 years.  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  Several years ago, he was found to have a heart murmur on routine physical exam, and for the last several years, he has been followed by Dr. Gala Romney at the [facility] here in [place].  Echocardiograms have revealed mitral valve prolapse with mitral regurgitation.  Exercise treadmill performed in December 2007 reportedly demonstrated normal left ventricular function with no evidence for ischemia.  Transesophageal echocardiogram was performed in September 2011.  This confirmed the presence of bileaflet mitral valve prolapse with severe mitral regurgitation.  Left ventricular size was normal as was left ventricular systolic function.  Ejection fraction was estimated at 60%.  No other abnormalities were noted.  The patient was seen recently in followup by Dr. Gala Romney, and discussions regarding the possibility of early surgical intervention were entertained.  He was referred for formal surgical consultation at this time.  REVIEW OF SYSTEMS:  GENERAL:  The patient reports normal appetite.  He has not been gaining nor losing weight recently.  He is 5 feet 7 inches tall, weighs 160 pounds. CARDIAC:  The patient specifically denies any chest pain, chest pressure, chest tightness either with activity or at rest.  The patient specifically denies any shortness of breath either with activity or at rest.  He exercises regularly and has reportedly good exercise tolerance.  He denies PND, orthopnea, or lower extremity  edema.  He does admit to occasional palpitation. RESPIRATORY:  Negative.  The patient denies productive cough, hemoptysis, or wheezing. GASTROINTESTINAL:  Negative.  The patient has had symptoms of reflux in the past but none recently.  He has no difficulty swallowing.  He reports normal bowel function.  He denies hematochezia, hematemesis, or melena. MUSCULOSKELETAL:  Essentially negative.  The patient does have mild arthritis and arthralgias afflicting both wrists.  This does not seem to limit him much and seems to be sporadic at most. NEUROLOGIC:  Negative. PSYCHIATRIC:  Negative. HEENT:  Negative.  The patient wears glasses.  He sees his dentist on a regular basis and his dentist is aware of history of heart murmur. HEMATOLOGIC:  Negative.  PAST MEDICAL HISTORY: 1. Mitral valve prolapse with mitral regurgitation. 2. Hepatitis C. 3. GE reflux disease.  CURRENT MEDICATIONS: 1. Vitamin D. 2. Nasonex spray as needed.  DRUG ALLERGIES:  None known.  PHYSICAL EXAMINATION:  GENERAL:  The patient is a well-appearing male who appears his stated age in no acute distress.  VITAL SIGNS;  Blood pressure 130/80, pulse 78, and oxygen saturation 97% on room air. HEENT:  Unrevealing.  NECK:  Supple.  There is no cervical nor supraclavicular lymphadenopathy.  There is no jugular venous distention. No carotid bruits are noted.  CHEST:  Auscultation of the chest is notable for clear breath sounds which are symmetrical bilaterally.  No wheezes, rales, or rhonchi noted.  CARDIOVASCULAR:  Regular rate and rhythm.  There is a grade 2-3/6 systolic  murmur that seems to be primarily late systolic and located best at the apex with radiation towards the axilla.  No diastolic murmurs are noted.  ABDOMEN:  Soft, nondistended, and nontender.  Bowel sounds are present.  EXTREMITIES: Femoral pulses are palpable.  Distal pulses are palpable.  There is no lower extremity edema.  SKIN:  Clean, dry,  healthy-appearing throughout. RECTAL AND GU:  Both deferred.  NEUROLOGIC:  Grossly nonfocal and symmetrical throughout.  DIAGNOSTIC TEST:  Transesophageal echocardiogram performed on September 08, 2010, by Dr. Gala Romney at [facility] is reviewed. 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  The jet of mitral regurgitation is eccentric and appears to be emanating from excessive prolapse of the close to the anterior commissure (A1/P1), although there is bileaflet prolapse involving multiple segments.  The jet of regurgitation is directed eccentrically and more posteriorly rather than anteriorly.  Left ventricular size is normal.  Left ventricular systolic function appears normal.  The tricuspid valve is normal with trivial regurgitation.  The aortic valve is normal.  No other significant abnormalities are noted.  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  The patient has at least moderate-to-severe mitral regurgitation.  Left ventricular size is normal.  Left ventricular systolic function is normal.  He remains completely asymptomatic with good exercise tolerance.  Options include continued close observation versus elective surgical intervention.  Although he has not yet been cathed, he would likely be a good candidate for minimally invasive approach for treatment of his mitral valve repair.  PLAN:  I have discussed options at length with the patient here in the office today.  The rationale for early surgical intervention has been discussed and risks of surgery have been compared and contrasted with continued close observation and careful follow up.  All of his questions have been addressed.  He desires to  hold off for at least another year or so for variety of reasons.  He will return in one years' time for further followup.  In the meanwhile, he will continue to follow up with Dr. Gala Romney on a regular basis.  All of his questions have been addressed.  Bradley Hansen, M.D. Electronically Signed  CHO/MEDQ  D:  03/09/2011  T:  03/10/2011  Job:  [id number]  cc:   Bevelyn Buckles. Bensimhon, MD Milinda Antis, MD

## 2011-03-13 ENCOUNTER — Encounter: Payer: Self-pay | Admitting: Internal Medicine

## 2011-03-13 NOTE — Assessment & Plan Note (Signed)
Appears severe. Will check f/u echo and possibly refer for surgical eval.

## 2011-05-12 NOTE — Assessment & Plan Note (Signed)
[facility]                            CARDIOLOGY OFFICE NOTE   BHARAT, ANTILLON                           MRN:          [id number]  DATE:11/23/2008                            DOB:          December 19, 1951    INTERVAL HISTORY:  Mr. Buddenhagen is a 60 year old male with a history of  bileaflet mitral valve prolapse and mild to moderate mitral  regurgitation.  He also has a history of leukopenia and  hypercholesterolemia.  He had his exercise treadmill back in December  2007, which showed no evidence of ischemia.  Good exercise tolerance.   He returns today for routine followup.  He states he is doing great.  He  walks on a treadmill up to 5 miles an hour for an hour at a time with 3  times a week with no chest pain or dyspnea.  He has not had any heart  failure.  No orthopnea.  No PND.  No lower extremity edema.  No  palpitations.  No syncope or presyncope.   REVIEW OF SYSTEMS:  Remainder of review of systems is negative for HPI  and problem list.   CURRENT MEDICATIONS:  None.   PHYSICAL EXAMINATION:  GENERAL:  He is in no acute distress.  He  ambulates around the clinic without any respiratory difficulty.  VITAL SIGNS:  Blood pressure is 108/60, heart rate is 68, and weight is  151.  HEENT:  Normal.  NECK:  Supple.  There is no JVD.  Carotids are 2+ bilaterally without  any bruits.  There is no lymphadenopathy or thyromegaly.  CARDIAC:  PMI  is nondisplaced.  He is regular with a 2/6 systolic ejection murmur at  the apex.  LUNGS:  Clear.  ABDOMEN:  Soft, nontender, and nondistended.  No hepatosplenomegaly.  No  bruits.  No masses.  Good bowel sounds.  EXTREMITIES:  Warm with no  cyanosis, clubbing, or edema.  No rash.  NEURO:  Alert and oriented x3.  Cranial nerves II-XII intact.  Moves all  4 extremities without difficulty.  Affect is pleasant.   EKG shows a sinus rhythm at a rate of 68.  No ST-T wave abnormalities.   ASSESSMENT AND PLAN:  Mitral  regurgitation secondary to mitral valve  prolapse.  He is somewhat resistant to getting a followup  echocardiogram, but I said it is important to make sure this is not  getting worse.  We need to have a routine surveillance.  We will go  ahead and get an echocardiogram in January.  I also discussed with him  once again the possibility of adding an ACE inhibitor for afterload  reduction and he is adamant that he does not want to take any  medications at this point.   DISPOSITION:  We will get the echocardiogram in January.  We will see  him back in 1 year for followup.     Bevelyn Buckles. Bensimhon, MD  Electronically Signed    DRB/MedQ  DD: 11/23/2008  DT: 11/23/2008  Job #: [id number]

## 2011-05-15 NOTE — Assessment & Plan Note (Signed)
[facility]                            CARDIOLOGY OFFICE NOTE   LEONARD, FEIGEL                           MRN:          [id number]  DATE:11/25/2006                            DOB:          07/24/1951    PATIENT IDENTIFICATION:  Bradley Hansen is a 60 year old male who returns for  routine followup.   PROBLEM LIST:  1. Mitral valve prolapse with associated mild mitral regurgitation.      Most recent echocardiogram October 2007 EF of 55% with bileaflet      mild mitral valve prolapse.  There was mild to moderate mitral      regurgitation visually.  The volume of mitral regurgitation by PISA      was only 8 mL.  Left ventricular dimensions have been stable with      an internal dimension in systole of 35 mm.  2. Chest pain.  Exercise Cardiolite August 2004 EF of 50% and no      ischemia, mildly dilated ventricle.  3. Hypercholesterolemia.  4. Leukopenia, previously evaluated by Dr. Truett Perna and thought to be      benign.   CURRENT MEDICATIONS:  None.   INTERVAL HISTORY:  Mr. Rosiak returns today for routine followup.  He is  doing well.  He is exercising about once a week with no chest pain or  shortness of breath.  He has not had any palpitations.  No lower  extremity edema, orthopnea, or paroxysmal nocturnal dyspnea.   PHYSICAL EXAMINATION:  GENERAL:  He is well-appearing, no acute  distress.  He ambulates around the clinic without any difficulty.  Respirations are unlabored.  VITAL SIGNS:  Blood pressure is 105/60, heart rate is 64.  HEENT:  Sclerae anicteric, EOMI.  There are no xanthelasmas.  Mucous  membranes are moist.  NECK:  Supple.  There is no JVD.  Carotids are 2+ bilaterally without  any bruits.  There is no lymphadenopathy or thyromegaly.  CARDIAC:  He is regular rate and rhythm with a 2/6 systolic ejection  murmur at the apex.  LUNGS:  Clear.  ABDOMEN:  Soft, nontender, nondistended.  No hepatosplenomegaly, no  bruits, no masses.  EXTREMITIES:  Warm with no cyanosis, clubbing or edema.  Good distal  pulses.  NEUROLOGIC:  He is alert and oriented x3.  Cranial nerves II-XII are  intact.  He moves all four extremities without difficulty.   ASSESSMENT AND PLAN:  1. Mitral valve prolapse with mild to moderate mitral regurgitation.      He is quite stable.  He is asymptomatic and his ventricular      dimensions are stable.  I did suggest considering ACE inhibitor for      after-load reduction for theoretic benefit, although there is no      good data for this.  He continues to refuse taking any medications.  2. Cardiovascular risk screening.  We will check his lipids again      today.  I will also proceed with a routine stress test.  I have      asked him to take  a baby aspirin 81 mg a day.   DISPOSITION:  We will see him on a yearly basis with echocardiograms.  I  have asked him to contact me immediately if he should develop any  exertional dyspnea or other signs of heart failure.     Bevelyn Buckles. Bensimhon, MD  Electronically Signed    DRB/MedQ  DD: 11/25/2006  DT: 11/25/2006  Job #: [id number]

## 2011-05-15 NOTE — Procedures (Signed)
[facility]                              EXERCISE TREADMILL   NAME:Bradley Hansen, Bradley Hansen                           MRN:          [id number]  DATE:12/06/2006                            DOB:          1951-12-13    CARDIOLOGIST:  Bevelyn Buckles. Bensimhon, MD   HISTORY:  Bradley Hansen is a 60 year old male followed by Dr. Gala Romney with  a history of mitral valve prolapse associated with mitral regurgitation  who is set up for routine exercise treadmill test today for  cardiovascular screening.  He denies any chest pain or shortness of  breath.   EXERCISE TREADMILL TEST:  The patient exercised according to Bruce  protocol for 12 minutes, achieving a work level of 13.4 mets.  His  resting heart rate rose from 93 beats per minute to a maximal heart rate  of 162 beats per minute.  This represented 97% of his maximal age-  predicted heart rate.  His blood pressure at rest was 139/82.  This rose  to a maximum of 204/85.  The test was stopped secondary to leg pain.  He  denied any chest pain or significant shortness of breath.   ELECTROCARDIOGRAM:  At baseline shows sinus rhythm with a heart rate of  89.  At peak exercise, there were no ST-T wave changes to suggest  ischemia or injury.  He did have frequent PACs and occasional PVCs  throughout.   IMPRESSION:  Clinically and electrically negative routine exercise  treadmill test.   DISPOSITION:  Patient will follow up with Dr. Gala Romney as scheduled.      [name], PA-C  Electronically Signed      Madolyn Frieze. Jens Som, MD, [facility]  Electronically Signed   SW/MedQ  DD: 12/06/2006  DT: 12/06/2006  Job #: [id number]

## 2011-11-16 ENCOUNTER — Telehealth (HOSPITAL_COMMUNITY): Payer: Self-pay | Admitting: *Deleted

## 2011-11-17 NOTE — Telephone Encounter (Signed)
Opened in error

## 2012-02-01 ENCOUNTER — Ambulatory Visit (INDEPENDENT_AMBULATORY_CARE_PROVIDER_SITE_OTHER): Payer: Managed Care, Other (non HMO) | Admitting: Family Medicine

## 2012-02-01 ENCOUNTER — Encounter: Payer: Self-pay | Admitting: Family Medicine

## 2012-02-01 VITALS — BP 128/75 | HR 80 | Ht 67.0 in | Wt 154.0 lb

## 2012-02-01 DIAGNOSIS — Z0000 Encounter for general adult medical examination without abnormal findings: Secondary | ICD-10-CM

## 2012-02-01 DIAGNOSIS — K219 Gastro-esophageal reflux disease without esophagitis: Secondary | ICD-10-CM

## 2012-02-01 DIAGNOSIS — I059 Rheumatic mitral valve disease, unspecified: Secondary | ICD-10-CM

## 2012-02-01 DIAGNOSIS — K759 Inflammatory liver disease, unspecified: Secondary | ICD-10-CM

## 2012-02-01 LAB — COMPREHENSIVE METABOLIC PANEL
ALT: 25 U/L (ref 0–53)
AST: 28 U/L (ref 0–37)
Albumin: 4.7 g/dL (ref 3.5–5.2)
Alkaline Phosphatase: 54 U/L (ref 39–117)
BUN: 11 mg/dL (ref 6–23)
CO2: 26 mEq/L (ref 19–32)
Calcium: 9.8 mg/dL (ref 8.4–10.5)
Chloride: 104 mEq/L (ref 96–112)
Creat: 0.85 mg/dL (ref 0.50–1.35)
Glucose, Bld: 76 mg/dL (ref 70–99)
Potassium: 4.3 mEq/L (ref 3.5–5.3)
Sodium: 141 mEq/L (ref 135–145)
Total Bilirubin: 0.7 mg/dL (ref 0.3–1.2)
Total Protein: 7.6 g/dL (ref 6.0–8.3)

## 2012-02-01 LAB — LIPID PANEL
Cholesterol: 179 mg/dL (ref 0–200)
HDL: 58 mg/dL (ref >39)
LDL Cholesterol: 109 mg/dL — ABNORMAL HIGH (ref 0–99)
Total CHOL/HDL Ratio: 3.1 Ratio
Triglycerides: 58 mg/dL (ref <150)
VLDL: 12 mg/dL (ref 0–40)

## 2012-02-01 LAB — CBC
HCT: 40 % (ref 39.0–52.0)
Hemoglobin: 12.7 g/dL — ABNORMAL LOW (ref 13.0–17.0)
MCH: 30.2 pg (ref 26.0–34.0)
MCHC: 31.8 g/dL (ref 30.0–36.0)
MCV: 95.2 fL (ref 78.0–100.0)
Platelets: 181 10*3/uL (ref 150–400)
RBC: 4.2 MIL/uL — ABNORMAL LOW (ref 4.22–5.81)
RDW: 12 % (ref 11.5–15.5)
WBC: 2.9 10*3/uL — ABNORMAL LOW (ref 4.0–10.5)

## 2012-02-01 MED ORDER — ESOMEPRAZOLE MAGNESIUM 40 MG PO CPDR: 40.0000 mg | DELAYED_RELEASE_CAPSULE | Freq: Every day | ORAL | Status: DC

## 2012-02-01 NOTE — Patient Instructions (Signed)

## 2012-02-01 NOTE — Assessment & Plan Note (Addendum)
Clinically asymptomatic. Diagnostically severe per cardiology records. Pt states that he has followed up to discuss surgical options in the last 6 months and has deferred surgical intervention. Pending cardiology follow up. Discussed cardiovascular red flags at length including chest pain and dyspnea.

## 2012-02-01 NOTE — Progress Notes (Signed)
  Subjective:    Patient ID: Bradley Hansen, male    DOB: 1951/08/29, 61 y.o.   MRN: [id number]  HPI  Patient is here for a new doctor visit. Patient says be a previous patient of Dr. Jeanice Lim. No acute complaints or issues today.  Mitral valve prolapse: Patient is noted to have bileaflet mitral valve prolapse that he is seen by Dr. Gala Romney for. Patient had a TEE in September 2001 which showed severe mitral regurgitation with LV ejection fraction of 60%.  Patient states that he has been chronically asymptomatic. Surgical options have been discussed in the past. However, patient feels this is not necessary as he is currently asymptomatic and is concerned about overall cost of surgery. Patient states that he walks and runs on the treadmill on a 3-4 times a weekly basis with no reported chest pain shortness of breath or presyncope. Patient is followup with Dr. Teena Dunk on an approximately 1-2 months.  Hepatitis: Patient reports he that he does have a history of hepatitis in the past. Patient states he has received treatment for this in the past. However, patient states he is not receive treatments overuse because he feels he does not need it. No reported scleral icterus left her upper quadrant pain nausea or vomiting.   Review of Systems See HPI, otherwise ROS negative     Objective:   Physical Exam Gen: up in chair, NAD HEENT: NCAT, EOMI, TMs clear bilaterally CV: RRR, I-II systolic murmur PULM: CTAB, no wheezes, rales, rhoncii ABD: S/NT/+ bowel sounds  EXT: 2+ peripheral pulses         Assessment & Plan:

## 2012-02-01 NOTE — Assessment & Plan Note (Signed)
Nexium refilled today. Pt currently uses on an intermittent (once or twice per week) basis.

## 2012-02-01 NOTE — Assessment & Plan Note (Signed)
Will check lipids and LFTs in setting of hx/o hepatitis.

## 2012-03-01 ENCOUNTER — Encounter: Payer: Self-pay | Admitting: Internal Medicine

## 2012-03-01 ENCOUNTER — Ambulatory Visit (INDEPENDENT_AMBULATORY_CARE_PROVIDER_SITE_OTHER): Payer: Managed Care, Other (non HMO) | Admitting: Internal Medicine

## 2012-03-01 VITALS — BP 122/78 | HR 66 | Ht 67.0 in | Wt 157.8 lb

## 2012-03-01 DIAGNOSIS — I059 Rheumatic mitral valve disease, unspecified: Secondary | ICD-10-CM

## 2012-03-01 NOTE — Assessment & Plan Note (Signed)
We had a long discussion about the situation and his financial restrictions. He has agreed to repeat the echocardiogram if MR still severe, I will d/w Dr. Cornelius Moras and financial team about what is the best way to proceed.   Total MD time spent = 25 mins with over 70% of that time dedicated to counseling and discussions described above.

## 2012-03-01 NOTE — Patient Instructions (Signed)

## 2012-03-01 NOTE — Progress Notes (Signed)
Subjective:    [name] is a 61 year old male from the [place] with a history of hepatitis C, hyperlipidemia and severe mitral regurgitation due to bileaflet prolapse. He had his exercise treadmill back in December 2007, which showed no evidence of ischemia.  Good exercise tolerance.  Had echo 1/10. EF 55%. bileaflet MVP with moderate MR.  Had TEE in 9/11 which showed bileaflet MVP with probably severe MR with LVEF 60%. LV geometry was stable.  Given severity of MR, I referred him to Dr. Cornelius Moras in 3/12 for evaluation of MV repair.   He returns today for yearly followup. Says he went to see Dr. Cornelius Moras and they discussed MVR but he did not pursue it because he said insurance company told him he needed to have $6,000 in account before they would pay the rest.  He states he is doing well.  He walks on a treadmill 2-3x per week for up 1 hour and lifts weights with no chest pain or dyspnea.  He has not had any heart failure.  No orthopnea.  No PND.  No lower extremity edema.  No palpitations.  No syncope or presyncope.   ROS: All other systems normal except as listed in the HPI and Problem List.   Past Medical History  Diagnosis Date  . Mitral valve prolapse     TEE  08/2010 severe MR, bileaflet MVP, EF 60%  . Hypercholesteremia   . Synovitis of hand 2011    right hand  . Leukopenia   . Rhinitis 2011    chronic   . Hepatitis C 2004    pt doesn't want treatment  . Fibromyalgia   . Prostatitis   . Prostadynia      Current Outpatient Prescriptions on File Prior to Visit  Medication Sig Dispense Refill  . esomeprazole (NEXIUM) 40 MG capsule Take 1 capsule (40 mg total) by mouth daily.  30 capsule  3        Objective:    Physical Exam Filed Vitals:   03/01/12 1109  BP: 122/78  Pulse: 66  Height: 5\' 7"  (1.702 m)  Weight: 157 lb 12.8 oz (71.578 kg)    General:  Well appearing. No resp difficulty HEENT: normal Neck: supple. no JVD. Carotids 2+ bilat; no bruits. No  lymphadenopathy or thryomegaly appreciated. Cor: PMI nondisplaced. Regular rate & rhythm. No rubs, gallops. 2/6 midsystolic murmur at apex. Lungs: clear Abdomen: soft, nontender, nondistended. No hepatosplenomegaly. No bruits or masses. Good bowel sounds. Extremities: no cyanosis, clubbing, rash, edema Neuro: alert & orientedx3, cranial nerves grossly intact. moves all 4 extremities w/o difficulty. Affect pleasant   ECG: NSR 66 +borderline LVH. No ST-T wave abnormalities.

## 2012-03-07 ENCOUNTER — Encounter: Payer: Managed Care, Other (non HMO) | Admitting: Thoracic Surgery (Cardiothoracic Vascular Surgery)

## 2012-03-14 ENCOUNTER — Ambulatory Visit (HOSPITAL_COMMUNITY): Payer: Managed Care, Other (non HMO) | Attending: Cardiology

## 2012-03-14 ENCOUNTER — Other Ambulatory Visit: Payer: Self-pay

## 2012-03-14 DIAGNOSIS — I379 Nonrheumatic pulmonary valve disorder, unspecified: Secondary | ICD-10-CM | POA: Insufficient documentation

## 2012-03-14 DIAGNOSIS — I059 Rheumatic mitral valve disease, unspecified: Secondary | ICD-10-CM | POA: Insufficient documentation

## 2012-03-14 DIAGNOSIS — R011 Cardiac murmur, unspecified: Secondary | ICD-10-CM | POA: Insufficient documentation

## 2012-03-14 DIAGNOSIS — I079 Rheumatic tricuspid valve disease, unspecified: Secondary | ICD-10-CM | POA: Insufficient documentation

## 2012-03-14 DIAGNOSIS — E785 Hyperlipidemia, unspecified: Secondary | ICD-10-CM | POA: Insufficient documentation

## 2012-03-21 ENCOUNTER — Encounter: Payer: Managed Care, Other (non HMO) | Admitting: Thoracic Surgery (Cardiothoracic Vascular Surgery)

## 2012-04-04 ENCOUNTER — Encounter: Payer: Managed Care, Other (non HMO) | Admitting: Thoracic Surgery (Cardiothoracic Vascular Surgery)

## 2012-09-28 ENCOUNTER — Emergency Department (HOSPITAL_BASED_OUTPATIENT_CLINIC_OR_DEPARTMENT_OTHER)
Admission: EM | Admit: 2012-09-28 | Discharge: 2012-09-28 | Disposition: A | Discharge status: Home / Self Care | Payer: Managed Care, Other (non HMO) | Attending: Emergency Medicine | Admitting: Emergency Medicine

## 2012-09-28 ENCOUNTER — Encounter (HOSPITAL_BASED_OUTPATIENT_CLINIC_OR_DEPARTMENT_OTHER): Payer: Self-pay | Admitting: *Deleted

## 2012-09-28 ENCOUNTER — Emergency Department (HOSPITAL_BASED_OUTPATIENT_CLINIC_OR_DEPARTMENT_OTHER): Payer: Managed Care, Other (non HMO)

## 2012-09-28 DIAGNOSIS — R071 Chest pain on breathing: Secondary | ICD-10-CM | POA: Insufficient documentation

## 2012-09-28 DIAGNOSIS — E785 Hyperlipidemia, unspecified: Secondary | ICD-10-CM | POA: Insufficient documentation

## 2012-09-28 DIAGNOSIS — Z79899 Other long term (current) drug therapy: Secondary | ICD-10-CM | POA: Insufficient documentation

## 2012-09-28 DIAGNOSIS — R0789 Other chest pain: Secondary | ICD-10-CM

## 2012-09-28 LAB — CBC WITH DIFFERENTIAL/PLATELET
Basophils Absolute: 0 10*3/uL (ref 0.0–0.1)
Basophils Relative: 0 % (ref 0–1)
Eosinophils Absolute: 0.1 10*3/uL (ref 0.0–0.7)
Eosinophils Relative: 2 % (ref 0–5)
HCT: 35.2 % — ABNORMAL LOW (ref 39.0–52.0)
Hemoglobin: 11.9 g/dL — ABNORMAL LOW (ref 13.0–17.0)
Lymphocytes Relative: 33 % (ref 12–46)
Lymphs Abs: 1.3 10*3/uL (ref 0.7–4.0)
MCH: 31.5 pg (ref 26.0–34.0)
MCHC: 33.8 g/dL (ref 30.0–36.0)
MCV: 93.1 fL (ref 78.0–100.0)
Monocytes Absolute: 0.5 10*3/uL (ref 0.1–1.0)
Monocytes Relative: 14 % — ABNORMAL HIGH (ref 3–12)
Neutro Abs: 1.9 10*3/uL (ref 1.7–7.7)
Neutrophils Relative %: 51 % (ref 43–77)
Platelets: 163 10*3/uL (ref 150–400)
RBC: 3.78 MIL/uL — ABNORMAL LOW (ref 4.22–5.81)
RDW: 11.1 % — ABNORMAL LOW (ref 11.5–15.5)
WBC: 3.8 10*3/uL — ABNORMAL LOW (ref 4.0–10.5)

## 2012-09-28 LAB — COMPREHENSIVE METABOLIC PANEL
ALT: 26 U/L (ref 0–53)
AST: 22 U/L (ref 0–37)
Albumin: 4.2 g/dL (ref 3.5–5.2)
Alkaline Phosphatase: 57 U/L (ref 39–117)
BUN: 13 mg/dL (ref 6–23)
CO2: 28 mEq/L (ref 19–32)
Calcium: 9.5 mg/dL (ref 8.4–10.5)
Chloride: 102 mEq/L (ref 96–112)
Creatinine, Ser: 0.8 mg/dL (ref 0.50–1.35)
GFR calc Af Amer: >90 mL/min (ref >90)
GFR calc non Af Amer: >90 mL/min (ref >90)
Glucose, Bld: 99 mg/dL (ref 70–99)
Potassium: 3.7 mEq/L (ref 3.5–5.1)
Sodium: 138 mEq/L (ref 135–145)
Total Bilirubin: 0.4 mg/dL (ref 0.3–1.2)
Total Protein: 7.6 g/dL (ref 6.0–8.3)

## 2012-09-28 LAB — TROPONIN I
Troponin I: <0.3 ng/mL (ref <0.30)
Troponin I: <0.3 ng/mL (ref <0.30)

## 2012-09-28 MED ORDER — IBUPROFEN 400 MG PO TABS: 400.0000 mg | ORAL_TABLET | Freq: Four times a day (QID) | ORAL | Status: DC | PRN

## 2012-09-28 MED ORDER — KETOROLAC TROMETHAMINE 30 MG/ML IJ SOLN
30.0000 mg | Freq: Once | INTRAMUSCULAR | Status: AC
  Administered 2012-09-28: 30 mg via INTRAVENOUS
  Filled 2012-09-28: qty 1

## 2012-09-28 NOTE — ED Notes (Signed)
Patient states he developed right chest pain yesterday morning.  States pain has progressively gotten worse.  Describes pain as intermittent and feels like someone is sticking a knife in his chest.

## 2012-09-28 NOTE — ED Provider Notes (Signed)
History     CSN: [id number]  Arrival date & time 09/28/12  1753   First MD Initiated Contact with Patient 09/28/12 1805      Chief Complaint  Patient presents with  . Chest Pain    right     (Consider location/radiation/quality/duration/timing/severity/associated sxs/prior treatment) HPIKatie Hansen is a 61 y.o. male previously from the [place] with a medical history is significant for hepatitis C, hyperlipidemia and severe mitral regurgitation due to bileaflet prolapse. Patient presents today with chest pain it started yesterday morning. He says the pain has gotten progressively worse, it is intermittent, it is sharp and stabbing, it is severe, nonradiating and is not associated with exertion, shortness of breath, nausea vomiting or diaphoresis. Patient does not have a known history of coronary artery disease. No dizziness, lightheadedness, recent illness, sick contacts.  Past Medical History  Diagnosis Date  . Mitral valve prolapse     TEE  08/2010 severe MR, bileaflet MVP, EF 60%  . Hypercholesteremia   . Synovitis of hand 2011    right hand  . Leukopenia   . Rhinitis 2011    chronic   . Hepatitis C 2004    pt doesn't want treatment  . Fibromyalgia   . Prostatitis   . Prostadynia     History reviewed. No pertinent past surgical history.  Family History  Problem Relation Age of Onset  . Heart attack Neg Hx   . Cancer Neg Hx   . Stroke Neg Hx     History  Substance Use Topics  . Smoking status: Never Smoker   . Smokeless tobacco: Never Used  . Alcohol Use: Yes    Review of Systems At least 10pt or greater review of systems completed and are negative except where specified in the HPI.  Allergies  Review of patient's allergies indicates no known allergies.  Home Medications   Current Outpatient Rx  Name Route Sig Dispense Refill  . ESOMEPRAZOLE MAGNESIUM 40 MG PO CPDR Oral Take 1 capsule (40 mg total) by mouth daily. 30 capsule 3    BP 151/73  Pulse 77   Temp 98.7 F (37.1 C) (Oral)  Resp 20  Ht 5\' 6"  (1.676 m)  Wt 160 lb (72.576 kg)  BMI 25.82 kg/m2  SpO2 100%  Physical Exam  Nursing notes reviewed.  Electronic medical record reviewed. VITAL SIGNS:   Filed Vitals:   09/28/12 1805  BP: 151/73  Pulse: 77  Temp: 98.7 F (37.1 C)  TempSrc: Oral  Resp: 20  Height: 5\' 6"  (1.676 m)  Weight: 160 lb (72.576 kg)  SpO2: 100%   CONSTITUTIONAL: Awake, oriented, appears non-toxic HENT: Atraumatic, normocephalic, oral mucosa pink and moist, airway patent. Nares patent without drainage. External ears normal. EYES: Conjunctiva clear, EOMI, PERRLA NECK: Trachea midline, non-tender, supple CARDIOVASCULAR: Normal heart rate, Normal rhythm, 2/6 systolic murmur at the apex PULMONARY/CHEST: Clear to auscultation, no rhonchi, wheezes, or rales. Symmetrical breath sounds. Non-tender. ABDOMINAL: Non-distended, soft, non-tender - no rebound or guarding.  BS normal. NEUROLOGIC: Non-focal, moving all four extremities, no gross sensory or motor deficits. EXTREMITIES: No clubbing, cyanosis, or edema SKIN: Warm, Dry, No erythema, No rash  ED Course  Procedures (including critical care time)  Date: 10/05/2012 : Time 18:02:53  Rate: 70  Rhythm: normal sinus rhythm  QRS Axis: normal  Intervals: normal  ST/T Wave abnormalities: normal  Conduction Disutrbances: none  Narrative Interpretation: Left ventricular hypertrophy - nonischemic EKG    Date: 10/05/2012 time 20:32:43  Rate:  65  Rhythm: normal sinus rhythm  QRS Axis: normal  Intervals: normal  ST/T Wave abnormalities: normal  Conduction Disutrbances: none  Narrative Interpretation: Left ventricular hypertrophy nonischemic EKG   Labs Reviewed  CBC WITH DIFFERENTIAL - Abnormal; Notable for the following:    WBC 3.8 (*)     RBC 3.78 (*)     Hemoglobin 11.9 (*)     HCT 35.2 (*)     RDW 11.1 (*)     Monocytes Relative 14 (*)     All other components within normal limits  TROPONIN I    COMPREHENSIVE METABOLIC PANEL   Dg Chest 2 View  09/28/2012  *RADIOLOGY REPORT*  Clinical Data: Chest pain  CHEST - 2 VIEW  Comparison: 12/24/2008 chest radiograph  Findings: Mild cardiomegaly noted. The lungs are clear. There is no evidence of focal airspace disease, pulmonary edema, suspicious pulmonary nodule/mass, pleural effusion, or pneumothorax. No acute bony abnormalities are identified.  IMPRESSION: Cardiomegaly without evidence of active cardiopulmonary disease.   Original Report Authenticated By: Rosendo Gros, M.D.    No diagnosis found.   Medications  ibuprofen (ADVIL,MOTRIN) 400 MG tablet (not administered)  ketorolac (TORADOL) 30 MG/ML injection 30 mg (30 mg Intravenous Given 09/28/12 1833)     MDM  [name] is a 61 y.o. male presenting with chest pain. Is a 61 year old gentleman with hypercholesterolemia but no known coronary artery disease. Patient did have an exercise treadmill test done back in 2007 showing no ischemia and a good exercise tolerance. Echo was performed in January 2010 showing inadequate ejection fraction of 55% with the bileaflet MVP and moderate MR. He had a followup TEE in 08/2010 showing the same mitral regurgitation with left ventricular ejection fraction of 60% without any left ventricular hypertrophy. Patient has been referred to thoracic surgery last year for evaluation of mitral valve repair which she has not done quite yet for financial purposes-he says he is supposed to have $6000 of his own money prior to getting the surgery done.  Patient's chest pain seems atypical at this time, he does have a history of mitral valve prolapse, I do not think that's the cause of his pain-I think it's likely chest wall pain versus gastrointestinal.  We'll however perform serial cardiac biomarkers and a chest x-ray as well as an EKG looking for any troponin leak.  Patient's labs are unremarkable, his delta to hour troponin is negative, and his EKGs done serially  showed no change suggestive of ischemia or infarction. Patient did get relief with nonsteroidal anti-inflammatories suggesting that he has some chest wall pain at this time. Patient has followup with his cardiologist and to follow up with him in the next 2 days.   I do not think his chest pain has an emergent cause - the differential diagnosis of chest pain includes: Acute coronary syndrome, pericarditis, aortic dissection, pulmonary embolism, tension pneumothorax, and esophageal rupture. At this time, troponins serially have effectively ruled out acute coronary syndrome, pericarditis, myocarditis. In addition patient's pain has gotten better there is no evidence on chest x-ray physical exam for pneumothorax, aortic dissection he has not been vomiting making esophageal rupture but much less likely in addition, patient is completely nontoxic and comfortable at this time.  Patient is PERC negative - effectively making him low risk for pulmonary embolism.   I explained the diagnosis and have given explicit precautions to return to the ER including consistent chest pain, change in chest pain or any other new or worsening symptoms.  The patient understands and accepts the medical plan as it's been dictated and I have answered their questions. Discharge instructions concerning home care and prescriptions have been given.  The patient is STABLE and is discharged to home in good condition.        Jones Skene, MD 10/05/12 1420

## 2013-03-06 ENCOUNTER — Ambulatory Visit (INDEPENDENT_AMBULATORY_CARE_PROVIDER_SITE_OTHER): Payer: Managed Care, Other (non HMO) | Admitting: Cardiology

## 2013-03-06 ENCOUNTER — Encounter: Payer: Self-pay | Admitting: Cardiology

## 2013-03-06 VITALS — BP 120/80 | HR 72 | Ht 67.0 in | Wt 157.0 lb

## 2013-03-06 DIAGNOSIS — I700 Atherosclerosis of aorta: Secondary | ICD-10-CM

## 2013-03-06 DIAGNOSIS — I059 Rheumatic mitral valve disease, unspecified: Secondary | ICD-10-CM

## 2013-03-06 NOTE — Progress Notes (Signed)
HPI The patient presents for followup of mitral valve prolapse. He has had this for some time with bileaflet prolapse with severe regurgitation described on a transesophageal echo in 2011.  He did see Dr. Cornelius Moras in 2012 with consideration given to repair versus close followup as he was asymptomatic with well-preserved ejection fraction. He did see Dr. Gala Romney last year and has a mention that he agreed to echocardiography. However, he did not have this. The issue has been financial in that he apparently has a large deductible with his insurance and he says that he would not be able to pay this.    The patient works full time. He denies any chest pressure, neck or arm discomfort. He might get some dyspnea if he does vigorous activity but he is not described resting shortness of breath, PND or orthopnea. He occasionally has some palpitations but no presyncope or syncope. He has had no weight gain or edema.   No Known Allergies  Current Outpatient Prescriptions  Medication Sig Dispense Refill  . aspirin 81 MG tablet Take 81 mg by mouth daily.      Marland Kitchen esomeprazole (NEXIUM) 40 MG capsule Take 40 mg by mouth as needed.      Marland Kitchen ibuprofen (ADVIL,MOTRIN) 400 MG tablet Take 1 tablet (400 mg total) by mouth every 6 (six) hours as needed for pain.  30 tablet  0   No current facility-administered medications for this visit.    Past Medical History  Diagnosis Date  . Mitral valve prolapse     TEE  08/2010 severe MR, bileaflet MVP, EF 60%  . Hypercholesteremia   . Synovitis of hand 2011    right hand  . Leukopenia   . Rhinitis 2011    chronic   . Hepatitis C 2004    pt doesn't want treatment  . Fibromyalgia   . Prostatitis   . Prostadynia     No past surgical history on file.  ROS:   As stated in the HPI and negative for all other systems.  PHYSICAL EXAM BP 120/80  Pulse 72  Ht 5\' 7"  (1.702 m)  Wt 157 lb (71.215 kg)  BMI 24.58 kg/m2 GENERAL:  Well appearing HEENT:  Pupils equal round and  reactive, fundi not visualized, oral mucosa unremarkable NECK:  No jugular venous distention, waveform within normal limits, carotid upstroke brisk and symmetric, no bruits, no thyromegaly LYMPHATICS:  No cervical, inguinal adenopathy LUNGS:  Clear to auscultation bilaterally BACK:  No CVA tenderness CHEST:  Unremarkable HEART:  PMI not displaced or sustained,S1 and S2 within normal limits, no S3, no S4, positive click, no rubs, harsh holosystolic murmur heard at the apex  ABD:  Flat, positive bowel sounds normal in frequency in pitch, no bruits, no rebound, no guarding, no midline pulsatile mass, no hepatomegaly, no splenomegaly EXT:  2 plus pulses throughout, no edema, no cyanosis no clubbing SKIN:  No rashes no nodules NEURO:  Cranial nerves II through XII grossly intact, motor grossly intact throughout PSYCH:  Cognitively intact, oriented to person place and time   EKG:  Sinus rhythm, axis within normal limits, rate 72, left ventricular hypertrophy, no acute ST-T wave changes.  03/06/2013  ASSESSMENT AND PLAN  MVP with severe MR:  The patient has had severe mitral regurgitation documented for the last 3 years. He certainly would have an indication for valve repair. Understanding that we would prefer to do this before there are resultant arrhythmias such as atrial fibrillation or left ventricular dysfunction  he would consent to having this done. He understands endocarditis prophylaxis. I will review this case again with Dr.Owen and suggest followup TEE with probable cardiac catheterization to follow followed by valve repair.

## 2013-03-06 NOTE — Patient Instructions (Addendum)
The current medical regimen is effective;  continue present plan and medications.  Dr Antoine Poche will be in touch with you after discussing your case with Dr Cornelius Moras.

## 2013-03-08 ENCOUNTER — Telehealth: Payer: Self-pay | Admitting: Cardiology

## 2013-03-08 NOTE — Telephone Encounter (Signed)
New Problem:    Patient called in wanting to know the status of Dr. Jenene Slicker consult with Dr. Cornelius Moras.  Please call back.

## 2013-03-08 NOTE — Telephone Encounter (Signed)
Attempted to call pt, number is not working.

## 2013-03-15 ENCOUNTER — Encounter: Payer: Self-pay | Admitting: *Deleted

## 2013-03-16 ENCOUNTER — Telehealth: Payer: Self-pay | Admitting: *Deleted

## 2013-03-16 NOTE — Telephone Encounter (Signed)
Spoke with pt who is aware of TEE scheduled for Monday 3/24 - pt will call back today with a fax number to send instructions to.

## 2013-03-17 NOTE — Telephone Encounter (Signed)
Spoke with pt and instructions will be faxed to him at [phone].  Reviewed them with him verbally as well

## 2013-03-20 ENCOUNTER — Ambulatory Visit (HOSPITAL_COMMUNITY)
Admission: RE | Admit: 2013-03-20 | Discharge: 2013-03-20 | Disposition: A | Discharge status: Home Health | Payer: Managed Care, Other (non HMO) | Source: Ambulatory Visit | Attending: Cardiovascular Disease | Admitting: Cardiovascular Disease

## 2013-03-20 ENCOUNTER — Encounter (HOSPITAL_COMMUNITY): Payer: Self-pay | Admitting: Gastroenterology

## 2013-03-20 ENCOUNTER — Encounter (HOSPITAL_COMMUNITY)
Admission: RE | Disposition: A | Discharge status: Home Health | Payer: Self-pay | Source: Ambulatory Visit | Attending: Cardiovascular Disease

## 2013-03-20 DIAGNOSIS — I059 Rheumatic mitral valve disease, unspecified: Secondary | ICD-10-CM | POA: Insufficient documentation

## 2013-03-20 DIAGNOSIS — J310 Chronic rhinitis: Secondary | ICD-10-CM | POA: Insufficient documentation

## 2013-03-20 DIAGNOSIS — Z79899 Other long term (current) drug therapy: Secondary | ICD-10-CM | POA: Insufficient documentation

## 2013-03-20 DIAGNOSIS — Z7982 Long term (current) use of aspirin: Secondary | ICD-10-CM | POA: Insufficient documentation

## 2013-03-20 DIAGNOSIS — B1920 Unspecified viral hepatitis C without hepatic coma: Secondary | ICD-10-CM | POA: Insufficient documentation

## 2013-03-20 DIAGNOSIS — IMO0001 Reserved for inherently not codable concepts without codable children: Secondary | ICD-10-CM | POA: Insufficient documentation

## 2013-03-20 DIAGNOSIS — E7800 Pure hypercholesterolemia, unspecified: Secondary | ICD-10-CM | POA: Insufficient documentation

## 2013-03-20 HISTORY — PX: TEE WITHOUT CARDIOVERSION: SHX5443

## 2013-03-20 SURGERY — ECHOCARDIOGRAM, TRANSESOPHAGEAL
Anesthesia: Moderate Sedation

## 2013-03-20 MED ORDER — FENTANYL CITRATE 0.05 MG/ML IJ SOLN
INTRAMUSCULAR | Status: AC
  Filled 2013-03-20: qty 2

## 2013-03-20 MED ORDER — SODIUM CHLORIDE 0.9 % IV SOLN
INTRAVENOUS | Status: DC
  Administered 2013-03-20: 14:00:00 via INTRAVENOUS

## 2013-03-20 MED ORDER — MIDAZOLAM HCL 5 MG/ML IJ SOLN
INTRAMUSCULAR | Status: AC
  Filled 2013-03-20: qty 2

## 2013-03-20 MED ORDER — BUTAMBEN-TETRACAINE-BENZOCAINE 2-2-14 % EX AERO
INHALATION_SPRAY | CUTANEOUS | Status: DC | PRN
  Administered 2013-03-20: 2 via TOPICAL

## 2013-03-20 MED ORDER — MIDAZOLAM HCL 10 MG/2ML IJ SOLN
INTRAMUSCULAR | Status: DC | PRN
  Administered 2013-03-20: 2 mg via INTRAVENOUS
  Administered 2013-03-20: 1 mg via INTRAVENOUS
  Administered 2013-03-20: 2 mg via INTRAVENOUS
  Administered 2013-03-20: 1 mg via INTRAVENOUS

## 2013-03-20 MED ORDER — FENTANYL CITRATE 0.05 MG/ML IJ SOLN
INTRAMUSCULAR | Status: DC | PRN
  Administered 2013-03-20: 50 ug via INTRAVENOUS
  Administered 2013-03-20 (×2): 25 ug via INTRAVENOUS

## 2013-03-20 NOTE — H&P (View-Only) (Signed)
HPI The patient presents for followup of mitral valve prolapse. He has had this for some time with bileaflet prolapse with severe regurgitation described on a transesophageal echo in 2011.  He did see Dr. Cornelius Moras in 2012 with consideration given to repair versus close followup as he was asymptomatic with well-preserved ejection fraction. He did see Dr. Gala Romney last year and has a mention that he agreed to echocardiography. However, he did not have this. The issue has been financial in that he apparently has a large deductible with his insurance and he says that he would not be able to pay this.    The patient works full time. He denies any chest pressure, neck or arm discomfort. He might get some dyspnea if he does vigorous activity but he is not described resting shortness of breath, PND or orthopnea. He occasionally has some palpitations but no presyncope or syncope. He has had no weight gain or edema.   No Known Allergies  Current Outpatient Prescriptions  Medication Sig Dispense Refill  . aspirin 81 MG tablet Take 81 mg by mouth daily.      Marland Kitchen esomeprazole (NEXIUM) 40 MG capsule Take 40 mg by mouth as needed.      Marland Kitchen ibuprofen (ADVIL,MOTRIN) 400 MG tablet Take 1 tablet (400 mg total) by mouth every 6 (six) hours as needed for pain.  30 tablet  0   No current facility-administered medications for this visit.    Past Medical History  Diagnosis Date  . Mitral valve prolapse     TEE  08/2010 severe MR, bileaflet MVP, EF 60%  . Hypercholesteremia   . Synovitis of hand 2011    right hand  . Leukopenia   . Rhinitis 2011    chronic   . Hepatitis C 2004    pt doesn't want treatment  . Fibromyalgia   . Prostatitis   . Prostadynia     No past surgical history on file.  ROS:   As stated in the HPI and negative for all other systems.  PHYSICAL EXAM BP 120/80  Pulse 72  Ht 5\' 7"  (1.702 m)  Wt 157 lb (71.215 kg)  BMI 24.58 kg/m2 GENERAL:  Well appearing HEENT:  Pupils equal round and  reactive, fundi not visualized, oral mucosa unremarkable NECK:  No jugular venous distention, waveform within normal limits, carotid upstroke brisk and symmetric, no bruits, no thyromegaly LYMPHATICS:  No cervical, inguinal adenopathy LUNGS:  Clear to auscultation bilaterally BACK:  No CVA tenderness CHEST:  Unremarkable HEART:  PMI not displaced or sustained,S1 and S2 within normal limits, no S3, no S4, positive click, no rubs, harsh holosystolic murmur heard at the apex  ABD:  Flat, positive bowel sounds normal in frequency in pitch, no bruits, no rebound, no guarding, no midline pulsatile mass, no hepatomegaly, no splenomegaly EXT:  2 plus pulses throughout, no edema, no cyanosis no clubbing SKIN:  No rashes no nodules NEURO:  Cranial nerves II through XII grossly intact, motor grossly intact throughout PSYCH:  Cognitively intact, oriented to person place and time   EKG:  Sinus rhythm, axis within normal limits, rate 72, left ventricular hypertrophy, no acute ST-T wave changes.  03/06/2013  ASSESSMENT AND PLAN  MVP with severe MR:  The patient has had severe mitral regurgitation documented for the last 3 years. He certainly would have an indication for valve repair. Understanding that we would prefer to do this before there are resultant arrhythmias such as atrial fibrillation or left ventricular dysfunction  he would consent to having this done. He understands endocarditis prophylaxis. I will review this case again with Dr.Owen and suggest followup TEE with probable cardiac catheterization to follow followed by valve repair.

## 2013-03-20 NOTE — Interval H&P Note (Signed)
History and Physical Interval Note:  03/20/2013 3:11 PM  Bradley Hansen  has presented today for surgery, with the diagnosis of mitro vibe  The various methods of treatment have been discussed with the patient and family. After consideration of risks, benefits and other options for treatment, the patient has consented to  Procedure(s): TRANSESOPHAGEAL ECHOCARDIOGRAM (TEE) (N/A) as a surgical intervention .  The patient's history has been reviewed, patient examined, no change in status, stable for surgery.  I have reviewed the patient's chart and labs.  Questions were answered to the patient's satisfaction.     Elyn Aquas.

## 2013-03-20 NOTE — Progress Notes (Signed)
  Echocardiogram Echocardiogram Transesophageal has been performed.  Jorje Guild 03/20/2013, 4:33 PM

## 2013-03-20 NOTE — CV Procedure (Signed)
    Transesophageal Echocardiogram Note  Bradley Hansen [id number] 06-18-1951  Procedure: Transesophageal Echocardiogram Indications: mitral valve prolapse with MR  Procedure Details Consent: Obtained Time Out: Verified patient identification, verified procedure, site/side was marked, verified correct patient position, special equipment/implants available, Radiology Safety Procedures followed,  medications/allergies/relevent history reviewed, required imaging and test results available.  Performed  Medications: Fentanyl: 100 mcg IV Versed: 6 mg   Left Ventrical:  Normal LV function   Mitral Valve: prolapse of both the anterior and posterior leaflets.  Multiple scalloped areas. Moderate - severe MR  Aortic Valve: normal , no AS or AI  Tricuspid Valve: trace TR  Pulmonic Valve: trace PI  Left Atrium/ Left atrial appendage: no thrombus  Atrial septum: normal   Aorta: normal    Complications: No apparent complications Patient did tolerate procedure well.   Bradley Hansen, Bradley Hansen., MD, [facility] 03/20/2013, 3:35 PM

## 2013-03-21 ENCOUNTER — Encounter (HOSPITAL_COMMUNITY): Payer: Self-pay | Admitting: Cardiovascular Disease

## 2013-09-25 ENCOUNTER — Ambulatory Visit (INDEPENDENT_AMBULATORY_CARE_PROVIDER_SITE_OTHER): Payer: Managed Care, Other (non HMO) | Admitting: Emergency Medicine

## 2013-09-25 VITALS — BP 112/68 | HR 79 | Temp 98.6°F | Resp 16 | Ht 68.0 in | Wt 154.0 lb

## 2013-09-25 DIAGNOSIS — Z0000 Encounter for general adult medical examination without abnormal findings: Secondary | ICD-10-CM

## 2013-09-25 NOTE — Progress Notes (Signed)
[facility] [address], [place] [address] [phone]  Date:  09/25/2013   Name:  Bradley Hansen   DOB:  [birth year]   MRN:  [id number]  PCP:  [name], MD    Chief Complaint: Annual Exam   History of Present Illness:  Bradley Hansen is a 62 y.o. very pleasant male patient who presents with the following:  Has history of low back pain and hepatitis C. On no regular medication.  No allergies.  Current on immunizations.  No improvement with over the counter medications or other home remedies. Denies other complaint or health concern today.   Patient Active Problem List   Diagnosis Date Noted  . Annual physical exam 02/01/2012  . ATHEROSCLEROSIS OF AORTA 12/01/2010  . SHOULDER PAIN, RIGHT 12/01/2010  . BACK PAIN, CHRONIC 12/11/2009  . HEPATITIS C 02/24/2007  . MITRAL VALVE PROLAPSE 02/24/2007  . GASTROESOPHAGEAL REFLUX, NO ESOPHAGITIS 02/24/2007    Past Medical History  Diagnosis Date  . Mitral valve prolapse     TEE  08/2010 severe MR, bileaflet MVP, EF 60%  . Hypercholesteremia   . Synovitis of hand 2011    right hand  . Leukopenia   . Rhinitis 2011    chronic   . Hepatitis C 2004    pt doesn't want treatment  . Fibromyalgia   . Prostatitis   . Prostadynia     Past Surgical History  Procedure Laterality Date  . Appendectomy    . Tee without cardioversion N/A 03/20/2013    Procedure: TRANSESOPHAGEAL ECHOCARDIOGRAM (TEE);  Surgeon: [name], MD;  Location: [facility] ENDOSCOPY;  Service: Cardiovascular;  Laterality: N/A;    History  Substance Use Topics  . Smoking status: Never Smoker   . Smokeless tobacco: Never Used  . Alcohol Use: Yes     Comment: week    Family History  Problem Relation Age of Onset  . Heart attack Neg Hx   . Cancer Neg Hx   . Stroke Neg Hx     No Known Allergies  Medication list has been reviewed and updated.  Current Outpatient Prescriptions on File Prior to Visit  Medication Sig Dispense Refill  .  aspirin 81 MG tablet Take 81 mg by mouth daily.      Marland Kitchen esomeprazole (NEXIUM) 40 MG capsule Take 40 mg by mouth as needed.      Marland Kitchen ibuprofen (ADVIL,MOTRIN) 400 MG tablet Take 1 tablet (400 mg total) by mouth every 6 (six) hours as needed for pain.  30 tablet  0   No current facility-administered medications on file prior to visit.    Review of Systems:  As per HPI, otherwise negative.    Physical Examination: Filed Vitals:   09/25/13 1038  BP: 112/68  Pulse: 79  Temp: 98.6 F (37 C)  Resp: 16   Filed Vitals:   09/25/13 1038  Height: 5\' 8"  (1.727 m)  Weight: 154 lb (69.854 kg)   Body mass index is 23.42 kg/(m^2). Ideal Body Weight: Weight in (lb) to have BMI = 25: 164.1  GEN: WDWN, NAD, Non-toxic, A & O x 3 HEENT: Atraumatic, Normocephalic. Neck supple. No masses, No LAD. Ears and Nose: No external deformity. CV: RRR, No/G/R. No JVD. No thrill. No extra heart sounds.  Has mitral murmur PULM: CTA B, no wheezes, crackles, rhonchi. No retractions. No resp. distress. No accessory muscle use. ABD: S, NT, ND, +BS. No rebound. No HSM. EXTR: No c/c/e NEURO Normal gait.  PSYCH: Normally interactive. Conversant. Not depressed or anxious appearing.  Calm demeanor.    Assessment and Plan: Wellness exam Current on labs and immunizations  Signed,  Phillips Odor, MD

## 2014-01-08 ENCOUNTER — Ambulatory Visit (INDEPENDENT_AMBULATORY_CARE_PROVIDER_SITE_OTHER): Payer: BC Managed Care – PPO | Admitting: Cardiovascular Disease

## 2014-01-08 ENCOUNTER — Encounter: Payer: Self-pay | Admitting: Cardiovascular Disease

## 2014-01-08 VITALS — BP 128/80 | HR 75 | Ht 68.0 in | Wt 159.0 lb

## 2014-01-08 DIAGNOSIS — I341 Nonrheumatic mitral (valve) prolapse: Secondary | ICD-10-CM

## 2014-01-08 DIAGNOSIS — I059 Rheumatic mitral valve disease, unspecified: Secondary | ICD-10-CM

## 2014-01-08 DIAGNOSIS — K219 Gastro-esophageal reflux disease without esophagitis: Secondary | ICD-10-CM

## 2014-01-08 DIAGNOSIS — B1710 Acute hepatitis C without hepatic coma: Secondary | ICD-10-CM

## 2014-01-08 NOTE — Assessment & Plan Note (Signed)
Difficult for me to get a handle on this as the patient is new to me.  Needs f/u echo and to see Dr Antoine Poche and Cornelius Moras My review would indicate that despite bileaflet prolapse and a morphologically myxomatious valve his MR is only moderate Has been described as severe as far back as 2009.  He certainly is at risk fo PAF with dilated LE and increased LA pressure Will make sure patient has strain analysis with echo to get some handle on intrinsic contractility.  If f/u echo shows any worsening of  MR or LVE would have low threshold to perform right and left cath.  Will forward note to Dr Cornelius Moras and [facility]

## 2014-01-08 NOTE — Patient Instructions (Signed)
Your physician recommends that you schedule a follow-up appointment in: AFTER  ECHO   NEEDS TO  SEE DR [facility]   ALSO  NEEDS TO  SEE DR  Cornelius Moras Your physician recommends that you continue on your current medications as directed. Please refer to the Current Medication list given to you today.  Your physician has requested that you have an echocardiogram. Echocardiography is a painless test that uses sound waves to create images of your heart. It provides your doctor with information about the size and shape of your heart and how well your heart's chambers and valves are working. This procedure takes approximately one hour. There are no restrictions for this procedure.

## 2014-01-08 NOTE — Assessment & Plan Note (Signed)
Continue proton pump inhibitor

## 2014-01-08 NOTE — Assessment & Plan Note (Signed)
LFT;s normal in 2013  May need to be looked at further if he needs open heart surgery

## 2014-01-08 NOTE — Progress Notes (Signed)
Patient ID: Bradley Hansen, male   DOB: 1951-06-08, 63 y.o.   MRN: [id number] Bradley Hansen is a 63 yo patient of Dr Antoine Poche  He has had a complex course in 2012- 2013 and I am not sure how he ended up on my schedule for f/u.   He has known mitral valve prolapse.  In 2012 he had seen Dr Bradley Hansen for consideration of repair.  I cant find this note in EPIC but patient indicates that Since he was asymptomatic with normal EF that Dr Bradley Hansen did not think surgery was needed.  Saw Dr Antoine Poche 3/14 and he arranged TTE and TEE  Reviewed both these studies Myxomatous valve with bileaflet prolapse  Moderate Bradley to my view E/E'  Over 12 indicating high LA pressure Moderate LAE only measures 40 with increased volume S' preserved.    No strain imaging done   Patient working full time at [employer]  No chest pain dyspnea palpitations or syncope.  Says his teeth are in good shape goes to dentist ? In past if there were insurance issues and high deductables that kept him form f/u and proceeding with tests.        ROS: Denies fever, malais, weight loss, blurry vision, decreased visual acuity, cough, sputum, SOB, hemoptysis, pleuritic pain, palpitaitons, heartburn, abdominal pain, melena, lower extremity edema, claudication, or rash.  All other systems reviewed and negative  General: Affect appropriate Healthy:  appears stated age black male from [place]  HEENT: normal Neck supple with no adenopathy JVP normal no bruits no thyromegaly Lungs clear with no wheezing and good diaphragmatic motion Heart:  S1/S2MR murmur, no rub, gallop or click PMI normal Abdomen: benighn, BS positve, no tenderness, no AAA no bruit.  No HSM or HJR Distal pulses intact with no bruits No edema Neuro non-focal Skin warm and dry No muscular weakness   Current Outpatient Prescriptions  Medication Sig Dispense Refill  . esomeprazole (NEXIUM) 40 MG capsule Take 40 mg by mouth as needed.      Marland Kitchen ibuprofen (ADVIL,MOTRIN) 400 MG tablet Take 1  tablet (400 mg total) by mouth every 6 (six) hours as needed for pain.  30 tablet  0  . esomeprazole (NEXIUM) 40 MG capsule Take 40 mg by mouth as needed.       No current facility-administered medications for this visit.    Allergies  Review of patient's allergies indicates no known allergies.  Electrocardiogram:  NSR  No LAE   LVH  Assessment and Plan

## 2014-01-15 ENCOUNTER — Ambulatory Visit: Payer: BC Managed Care – PPO | Admitting: Thoracic Surgery (Cardiothoracic Vascular Surgery)

## 2014-01-29 ENCOUNTER — Other Ambulatory Visit: Payer: Self-pay | Admitting: *Deleted

## 2014-01-29 ENCOUNTER — Encounter: Payer: Self-pay | Admitting: Cardiovascular Disease

## 2014-01-29 ENCOUNTER — Ambulatory Visit (HOSPITAL_COMMUNITY): Payer: BC Managed Care – PPO | Attending: Cardiovascular Disease | Admitting: Cardiology

## 2014-01-29 ENCOUNTER — Ambulatory Visit (INDEPENDENT_AMBULATORY_CARE_PROVIDER_SITE_OTHER): Payer: BC Managed Care – PPO | Admitting: Thoracic Surgery (Cardiothoracic Vascular Surgery)

## 2014-01-29 ENCOUNTER — Encounter: Payer: Self-pay | Admitting: Thoracic Surgery (Cardiothoracic Vascular Surgery)

## 2014-01-29 VITALS — BP 146/80 | HR 80 | Resp 20 | Ht 68.0 in | Wt 155.0 lb

## 2014-01-29 DIAGNOSIS — R0989 Other specified symptoms and signs involving the circulatory and respiratory systems: Secondary | ICD-10-CM | POA: Insufficient documentation

## 2014-01-29 DIAGNOSIS — R0609 Other forms of dyspnea: Secondary | ICD-10-CM | POA: Insufficient documentation

## 2014-01-29 DIAGNOSIS — I059 Rheumatic mitral valve disease, unspecified: Secondary | ICD-10-CM

## 2014-01-29 DIAGNOSIS — I341 Nonrheumatic mitral (valve) prolapse: Secondary | ICD-10-CM

## 2014-01-29 DIAGNOSIS — I379 Nonrheumatic pulmonary valve disorder, unspecified: Secondary | ICD-10-CM | POA: Insufficient documentation

## 2014-01-29 DIAGNOSIS — I340 Nonrheumatic mitral (valve) insufficiency: Secondary | ICD-10-CM

## 2014-01-29 NOTE — Progress Notes (Signed)
[address]       [name] [id number]             [phone]     CARDIOTHORACIC SURGERY CONSULTATION REPORT  Referring Provider is Bradley Hansen, Bradley Hansen, Hansen PCP is Bradley Hansen, [name], Hansen  Chief Complaint  Patient presents with  . 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  Several years ago he was noted to have a heart murmur on routine physical examination, and he has been followed intermittently by Dr. [name], Dr. [name], and more recently by Dr. Eden Hansen.  Echocardiograms have demonstrated bileaflet prolapse of the mitral valve with moderate to severe mitral regurgitation.  I saw him in consultation on 03/09/2011 (prior to EPIC - note in [facility]mal[facility]CHART). At that time he remained entirely asymptomatic and he was able to demonstrate normal exercise tolerance on exercise stress echocardiogram. He was disinclined to consider surgery at that time and plans were made for continued medical therapy with close followup. I have not personally seen him since then, but he has been followed regularly with intermittent echocardiograms. He was seen last week by Dr. Eden Hansen and a repeat transthoracic echocardiogram scheduled for today. Followup consultation visit was requested.  The patient continues to work at [employer] and lives locally and [place] with his 61 year old son and one cousin. The patient reports that he remains reasonably active physically without any significant limitations. However, the patient does note that over the past 6 months to a year he has developed shortness of breath with strenuous exertion. He states that this does not occur with normal day-to-day activities, and his job is not particularly strenuous  physically. However, if he pushes himself physically he notes that he would get short of breath with exertion. His breathing recovers fairly quickly when he stops to rest. He denies any resting shortness of breath, PND, orthopnea, or lower extremity edema. He has not had any palpitations.  Past Medical History  Diagnosis Date  . Mitral valve prolapse     TEE  08/2010 severe MR, bileaflet MVP, EF 60%  . Hypercholesteremia   . Synovitis of hand 2011    right hand  . Leukopenia   . Rhinitis 2011    chronic   . Hepatitis Hansen 2004    pt doesn't want treatment  . Fibromyalgia   . Prostatitis   . Prostadynia   . Mitral regurgitation   . MITRAL VALVE PROLAPSE 02/24/2007    Qualifier: Diagnosis of  By: Bradley Hansen, Bradley Hansen    . GASTROESOPHAGEAL REFLUX, NO ESOPHAGITIS 02/24/2007    Qualifier: Diagnosis of  By: Bradley Hansen, Bradley Hansen    . HEPATITIS Hansen 02/24/2007    Qualifier: Diagnosis of  By: Bradley Hansen, Bradley Hansen    . BACK PAIN, CHRONIC 12/11/2009    Qualifier: Diagnosis of  By: Bradley Hansen, Bradley Hansen      Past Surgical History  Procedure Laterality Date  . Appendectomy    . Tee without cardioversion N/A 03/20/2013    Procedure: TRANSESOPHAGEAL ECHOCARDIOGRAM (TEE);  Surgeon: Vesta MixerPhilip J Nahser, Hansen;  [facility];  Service: Cardiovascular;  Laterality: N/A;    Family History  Problem Relation Age of Onset  . Heart attack Neg Hx   .  Cancer Neg Hx   . Stroke Neg Hx     History   Social History  . Marital Status: Married    Spouse Name: N/A    Number of Children: N/A  . Years of Education: N/A   Occupational History  . Not on file.   Social History Main Topics  . Smoking status: Never Smoker   . Smokeless tobacco: Never Used  . Alcohol Use: Yes     Comment: week  . Drug Use: No  . Sexual Activity: Not on file   Other Topics Concern  . Not on file   Social History Narrative  . No narrative on file    Current Outpatient Prescriptions  Medication Sig Dispense Refill  . esomeprazole  (NEXIUM) 40 MG capsule Take 40 mg by mouth as needed.      Marland Kitchen ibuprofen (ADVIL,MOTRIN) 400 MG tablet Take 1 tablet (400 mg total) by mouth every 6 (six) hours as needed for pain.  30 tablet  0   No current facility-administered medications for this visit.    No Known Allergies    Review of Systems:   General:  normal appetite, normal energy, no weight gain, no weight loss, no fever  Cardiac:  no chest pain with exertion, no chest pain at rest, + SOB with strenuous exertion, no resting SOB, no PND, no orthopnea, no palpitations, no arrhythmia, no atrial fibrillation, no LE edema, no dizzy spells, no syncope  Respiratory:  + exertional shortness of breath, no home oxygen, no productive cough, occasional dry cough, no bronchitis, no wheezing, no hemoptysis, no asthma, no pain with inspiration or cough, no sleep apnea, no CPAP at night  GI:   occasional difficulty swallowing, + reflux, occasional heartburn, no hiatal hernia, no abdominal pain, no constipation, no diarrhea, no hematochezia, no hematemesis, no melena  GU:   no dysuria,  no frequency, no urinary tract infection, no hematuria, no enlarged prostate, no kidney stones, no kidney disease  Vascular:  no pain suggestive of claudication, no pain in feet, no leg cramps, no varicose veins, no DVT, no non-healing foot ulcer  Neuro:   no stroke, no TIA's, no seizures, no headaches, no temporary blindness one eye,  no slurred speech, no peripheral neuropathy, no chronic pain, no instability of gait, no memory/cognitive dysfunction  Musculoskeletal: no arthritis, no joint swelling, occasional myalgias, no difficulty walking, normal mobility   Skin:   no rash, no itching, no skin infections, no pressure sores or ulcerations  Psych:   no anxiety, no depression, no nervousness, no unusual recent stress  Eyes:   no blurry vision, no floaters, no recent vision changes, does not wear glasses or contacts  ENT:   no hearing loss, no loose or painful teeth,  no dentures, last saw dentist 1 1/2 year ago  Hematologic:  no easy bruising, no abnormal bleeding, no clotting disorder, no frequent epistaxis  Endocrine:  no diabetes, does not check CBG's at home     Physical Exam:   BP 146/80  Pulse 80  Resp 20  Ht 5\' 8"  (1.727 m)  Wt 155 lb (70.308 kg)  BMI 23.57 kg/m2  SpO2 98%  General:    well-appearing  HEENT:  Unremarkable   Neck:   no JVD, no bruits, no adenopathy   Chest:   clear to auscultation, symmetrical breath sounds, no wheezes, no rhonchi   CV:   RRR, grade IV/VI systolic murmur   Abdomen:  soft, non-tender, no masses   Extremities:  warm, well-perfused, pulses diminished but palpable, no LE edema  Rectal/GU  Deferred  Neuro:   Grossly non-focal and symmetrical throughout  Skin:   Clean and dry, no rashes, no breakdown   Diagnostic Tests:  Transthoracic Echocardiography  Patient: Bradley Hansen, Bradley Hansen MR #: [id number] Study Date: 01/29/2014 Gender: M Age: 41 Height: 172.7cm Weight: 72.1kg BSA: 1.9m^2 Pt. Status: Room:  Layla Maw REFERRING Charlton Haws ATTENDING [place], [place] SONOGRAPHER Samule Ohm PERFORMING [facility], Outpatient cc:  ------------------------------------------------------------ LV EF: 55% - 60%  ------------------------------------------------------------ Indications: 424.0 Mitral valve disease.  ------------------------------------------------------------ History: PMH: Acquired from the patient and from the patient's chart. Dyspnea. Mitral regurgitation. Mitral valve prolapse.  ------------------------------------------------------------ Study Conclusions  - Left ventricle: The cavity size was normal. Wall thickness was normal. Systolic function was normal. The estimated ejection fraction was in the range of 55% to 60%. Wall motion was normal; there were no regional wall motion abnormalities. - Mitral valve: Severe, holosystolicprolapse, involving the anterior leaflet.  Moderate to severe regurgitation directed posteriorly. - Left atrium: The atrium was moderately to severely dilated. - Atrial septum: No defect or patent foramen ovale was identified. - Pulmonary arteries: PA peak pressure: 31mm Hg (S). Transthoracic echocardiography. M-mode, complete 2D, spectral Doppler, and color Doppler. Height: Height: 172.7cm. Height: 68in. Weight: Weight: 72.1kg. Weight: 158.7lb. Body mass index: BMI: 24.2kg/m^2. Body surface area: BSA: 1.22m^2. Blood pressure: 128/80. Patient status: Outpatient. Location: [facility]  ------------------------------------------------------------  ------------------------------------------------------------ Left ventricle: The cavity size was normal. Wall thickness was normal. Systolic function was normal. The estimated ejection fraction was in the range of 55% to 60%. Wall motion was normal; there were no regional wall motion abnormalities.  ------------------------------------------------------------ Aortic valve: Structurally normal valve. Trileaflet; normal thickness leaflets. Cusp separation was normal. Mobility was not restricted. Doppler: Transvalvular velocity was within the normal range. There was no stenosis. No regurgitation.  ------------------------------------------------------------ Aorta: Aortic root: The aortic root was normal in size.  ------------------------------------------------------------ Mitral valve: Moderately thickened leaflets . Moderate myxomatous degeneration. Mobility was not restricted. Severe, holosystolicprolapse, involving the anterior leaflet. Doppler: Transvalvular velocity was within the normal range. There was no evidence for stenosis. Moderate to severe regurgitation directed posteriorly. Peak gradient: 8mm Hg (D).  ------------------------------------------------------------ Left atrium: The atrium was moderately to severely  dilated.  ------------------------------------------------------------ Atrial septum: No defect or patent foramen ovale was identified.  ------------------------------------------------------------ Right ventricle: The cavity size was normal. Wall thickness was normal. Systolic function was normal.  ------------------------------------------------------------ Pulmonic valve: Structurally normal valve. Cusp separation was normal. Doppler: Transvalvular velocity was within the normal range. There was no evidence for stenosis. Mild regurgitation.  ------------------------------------------------------------ Tricuspid valve: Structurally normal valve. Doppler: Transvalvular velocity was within the normal range. Mild regurgitation.  ------------------------------------------------------------ Pulmonary artery: The main pulmonary artery was normal-sized. Systolic pressure was within the normal range.  ------------------------------------------------------------ Right atrium: The atrium was normal in size.  ------------------------------------------------------------ Pericardium: There was no pericardial effusion.  ------------------------------------------------------------ Systemic veins: Inferior vena cava: The vessel was normal in size; the respirophasic diameter changes were in the normal range (= 50%); findings are consistent with normal central venous pressure.  ------------------------------------------------------------  2D measurements Normal Doppler measurements Normal Left ventricle Main pulmonary LVID ED, 47.5 mm 43-52 artery chord, Pressure, 31 mm Hg =30 PLAX S LVID ES, 33.6 mm 23-38 Left ventricle chord, Ea, lat 8.38 cm/s ------ PLAX ann, tiss FS, chord, 29 % >29 DP PLAX E/Ea, lat 17.06 ------ LVPW, ED 11.8 mm ------ ann, tiss IVS/LVPW 0.91 <1.3 DP ratio, ED Ea, med 6.24 cm/s ------  Ventricular septum ann, tiss IVS, ED 10.7 mm ------ DP LVOT E/Ea, med  22.92 ------ Diam, S 26 mm ------ ann, tiss Area 5.31 cm^2 ------ DP Diam 26 mm ------ LVOT Aorta Peak vel, 91.9 cm/s ------ Root diam, 35 mm ------ S ED VTI, S 15.5 cm ------ AAo AP 37 mm ------ Stroke vol 82.3 ml ------ diam, S Stroke 44.5 ml/m^ ------ Left atrium index 2 AP dim 45 mm ------ Mitral valve AP dim 2.43 cm/m^2 <2.2 Peak E vel 143 cm/s ------ index Peak A vel 151 cm/s ------ Decelerati 222 ms 150-23 on time 0 Peak 8 mm Hg ------ gradient, D Peak E/A 0.9 ------ ratio Tricuspid valve Regurg 255 cm/s ------ peak vel Peak RV-RA 26 mm Hg ------ gradient, S Max regurg 255 cm/s ------ vel Systemic veins Estimated 5 mm Hg ------ CVP Right ventricle Pressure, 31 mm Hg <30 S Sa vel, 13.6 cm/s ------ lat ann, tiss DP  ------------------------------------------------------------ Prepared and Electronically Authenticated by  [employer], Mihai 2015-02-02T15:01:[id number]     Transesophageal Echocardiography  Patient: Juddson, Cobern MR #: [id number] Study Date: 03/20/2013 Gender: M Age: 75 Height: 170.2cm Weight: 72.3kg BSA: 1.44m^2 Pt. Status: Room: [facility]  ADMITTING Charlton Haws ATTENDING Charlton Haws PERFORMING Nahser, Isa Rankin Barrett, Rhonda REFERRING Barrett, Rhonda SONOGRAPHER Dewitt Hoes, RDCS cc:  ------------------------------------------------------------  ------------------------------------------------------------ Indications: Mitral valve prolapse 424.0.  ------------------------------------------------------------ Study Conclusions  - Aortic valve: No evidence of vegetation. - Mitral valve: Moderate prolapse, involving the anterior leaflet and the posterior leaflet. Moderate to severe regurgitation. - Left atrium: No evidence of thrombus in the atrial cavity or appendage. Transesophageal echocardiography. 2D and color Doppler. Height: Height: 170.2cm. Height: 67in. Weight: Weight: 72.3kg. Weight: 159lb. Body mass index:  BMI: 25kg/m^2. Body surface area: BSA: 1.44m^2. Blood pressure: 133/78. Patient status: Outpatient. Location: Endoscopy.  ------------------------------------------------------------  ------------------------------------------------------------ Aortic valve: Structurally normal valve. Cusp separation was normal. No evidence of vegetation. Doppler: No regurgitation.  ------------------------------------------------------------ Aorta: The aorta was normal, not dilated, and non-diseased.  ------------------------------------------------------------ Mitral valve: There are multiple jets of mitral regurgitation Moderate prolapse, involving the anterior leaflet and the posterior leaflet. Doppler: Moderate to severe regurgitation.  ------------------------------------------------------------ Left atrium: No evidence of thrombus in the atrial cavity or appendage.  ------------------------------------------------------------ Tricuspid valve: Structurally normal valve. Doppler: Mild regurgitation.  ------------------------------------------------------------ Post procedure conclusions Ascending Aorta:  - The aorta was normal, not dilated, and non-diseased.  ------------------------------------------------------------ Prepared and Electronically Authenticated by  Kristeen Miss 2014-03-25T12:50:[id number]   Impression:  Patient has long-standing mitral valve prolapse with at least moderate if not severe mitral regurgitation. Previously the patient has remained asymptomatic, but he now reports the development of symptoms of exertional shortness of breath over the last 6 months or more. These symptoms only occur with strenuous activity, but the patient is clearly noticed a difference. I personally reviewed the patient's echocardiogram performed earlier today and compared it with previous transthoracic and transesophageal echocardiograms. There does not appear to be a significant change in  the severity of mitral regurgitation or the size of the left ventricle. Nevertheless, the patient is now clearly symptomatic.   Plan:  The rationale for elective mitral valve repair surgery has been explained, including a comparison between surgery and continued medical therapy with close follow-up.  The likelihood of successful and durable valve repair has been discussed with particular reference to the findings of their recent echocardiogram.   Based upon these findings and previous experience, I have quoted them a greater than 95 percent likelihood of successful valve repair.  At this point in time the patient is interested in pursuing elective mitral valve repair in the near future. He will need left and right heart diagnostic catheterization. We will obtain CT angiogram of the thoracic and abdominal aorta to evaluate options for cannulation as I expect that he will be a good candidate for minimally invasive approach for surgery in the absence of significant coronary artery disease. The patient will return in 2 weeks after catheterization and CT angiograms have both been completed to discuss matters further and make final plans for surgery.    Salvatore Decent. Cornelius Moras, Hansen 01/29/2014 3:47 PM

## 2014-01-29 NOTE — Patient Instructions (Signed)
Schedule CT angiograms and cardiac catheterization as soon as practical.

## 2014-01-29 NOTE — Progress Notes (Signed)
Echo performed. 

## 2014-02-02 ENCOUNTER — Telehealth: Payer: Self-pay | Admitting: *Deleted

## 2014-02-02 ENCOUNTER — Other Ambulatory Visit: Payer: Self-pay | Admitting: *Deleted

## 2014-02-02 DIAGNOSIS — Z01810 Encounter for preprocedural cardiovascular examination: Secondary | ICD-10-CM

## 2014-02-02 NOTE — Telephone Encounter (Signed)
Right and left cath for MR 2/10 or 2/16 ----- Message ----- From: Purcell Nails, MD Sent: 01/29/2014 4:06 PM To: Dolores Patty, MD, Rollene Rotunda, MD, * Can one of you guys schedule Mr Beisel for left and right heart cath, hopefully avoiding his right femoral artery? He is now symptomatic and interested in mitral repair surgery. Thanks. Cub  PT  CAN  ONLY  DO  MONDAYS    CATH  SCHEDULED  FOR   2.23.15 WITH  DR  VERANASI  PER  DR NISHAN./CY

## 2014-02-05 ENCOUNTER — Other Ambulatory Visit: Payer: Self-pay | Admitting: Cardiovascular Disease

## 2014-02-05 ENCOUNTER — Other Ambulatory Visit: Payer: Self-pay | Admitting: *Deleted

## 2014-02-05 ENCOUNTER — Encounter: Payer: Self-pay | Admitting: *Deleted

## 2014-02-05 ENCOUNTER — Ambulatory Visit
Admission: RE | Admit: 2014-02-05 | Discharge: 2014-02-05 | Disposition: A | Discharge status: Home / Self Care | Payer: BC Managed Care – PPO | Source: Ambulatory Visit | Attending: Thoracic Surgery (Cardiothoracic Vascular Surgery) | Admitting: Thoracic Surgery (Cardiothoracic Vascular Surgery)

## 2014-02-05 DIAGNOSIS — I059 Rheumatic mitral valve disease, unspecified: Secondary | ICD-10-CM

## 2014-02-05 DIAGNOSIS — I719 Aortic aneurysm of unspecified site, without rupture: Secondary | ICD-10-CM

## 2014-02-05 DIAGNOSIS — I341 Nonrheumatic mitral (valve) prolapse: Secondary | ICD-10-CM

## 2014-02-05 LAB — CREATININE, SERUM: Creat: 0.9 mg/dL (ref 0.50–1.35)

## 2014-02-05 LAB — BUN: BUN: 9 mg/dL (ref 6–23)

## 2014-02-05 MED ORDER — IOHEXOL 350 MG/ML SOLN
100.0000 mL | Freq: Once | INTRAVENOUS | Status: AC | PRN
  Administered 2014-02-05: 100 mL via INTRAVENOUS

## 2014-02-06 ENCOUNTER — Encounter (HOSPITAL_COMMUNITY): Payer: Self-pay | Admitting: Pharmacist

## 2014-02-06 ENCOUNTER — Telehealth: Payer: Self-pay | Admitting: *Deleted

## 2014-02-06 NOTE — Telephone Encounter (Signed)
Left message for pt - per Dr Antoine Poche - he doesn't need to see the patient prior to the cath.  He does need to come into the office for the blood work and to pick up his instructions.  I asked the pt to call back to discuss whether he wants to be seen by Dr Antoine Poche before his cath or not.  Lab orders are in - instruction sheet is in my white information book on my cart.

## 2014-02-09 NOTE — Telephone Encounter (Signed)
Pt has an appt Monday with Dr Antoine Poche

## 2014-02-12 ENCOUNTER — Encounter: Payer: Self-pay | Admitting: Cardiology

## 2014-02-12 ENCOUNTER — Other Ambulatory Visit: Payer: BC Managed Care – PPO

## 2014-02-12 ENCOUNTER — Ambulatory Visit: Payer: BC Managed Care – PPO | Admitting: Thoracic Surgery (Cardiothoracic Vascular Surgery)

## 2014-02-12 ENCOUNTER — Ambulatory Visit (INDEPENDENT_AMBULATORY_CARE_PROVIDER_SITE_OTHER): Payer: BC Managed Care – PPO | Admitting: Cardiology

## 2014-02-12 VITALS — BP 137/85 | HR 75 | Ht 68.0 in | Wt 153.4 lb

## 2014-02-12 DIAGNOSIS — Z01810 Encounter for preprocedural cardiovascular examination: Secondary | ICD-10-CM

## 2014-02-12 DIAGNOSIS — I340 Nonrheumatic mitral (valve) insufficiency: Secondary | ICD-10-CM

## 2014-02-12 DIAGNOSIS — I059 Rheumatic mitral valve disease, unspecified: Secondary | ICD-10-CM

## 2014-02-12 LAB — BASIC METABOLIC PANEL
BUN: 10 mg/dL (ref 6–23)
CO2: 29 mEq/L (ref 19–32)
Calcium: 9.4 mg/dL (ref 8.4–10.5)
Chloride: 103 mEq/L (ref 96–112)
Creatinine, Ser: 0.8 mg/dL (ref 0.4–1.5)
GFR: 128.24 mL/min (ref >60.00)
Glucose, Bld: 86 mg/dL (ref 70–99)
Potassium: 4.1 mEq/L (ref 3.5–5.1)
Sodium: 138 mEq/L (ref 135–145)

## 2014-02-12 LAB — CBC WITH DIFFERENTIAL/PLATELET
Basophils Absolute: 0 10*3/uL (ref 0.0–0.1)
Basophils Relative: 0.4 % (ref 0.0–3.0)
Eosinophils Absolute: 0.1 10*3/uL (ref 0.0–0.7)
Eosinophils Relative: 2 % (ref 0.0–5.0)
HCT: 41.9 % (ref 39.0–52.0)
Hemoglobin: 13.5 g/dL (ref 13.0–17.0)
Lymphocytes Relative: 32.5 % (ref 12.0–46.0)
Lymphs Abs: 1.2 10*3/uL (ref 0.7–4.0)
MCHC: 32.2 g/dL (ref 30.0–36.0)
MCV: 96.4 fl (ref 78.0–100.0)
Monocytes Absolute: 0.3 10*3/uL (ref 0.1–1.0)
Monocytes Relative: 8.9 % (ref 3.0–12.0)
Neutro Abs: 2.1 10*3/uL (ref 1.4–7.7)
Neutrophils Relative %: 56.2 % (ref 43.0–77.0)
Platelets: 164 10*3/uL (ref 150.0–400.0)
RBC: 4.35 Mil/uL (ref 4.22–5.81)
RDW: 12.5 % (ref 11.5–14.6)
WBC: 3.7 10*3/uL — ABNORMAL LOW (ref 4.5–10.5)

## 2014-02-12 LAB — PROTIME-INR
INR: 1.2 ratio — ABNORMAL HIGH (ref 0.8–1.0)
Prothrombin Time: 12.7 s — ABNORMAL HIGH (ref 10.2–12.4)

## 2014-02-12 NOTE — Progress Notes (Signed)
 HPI The patient presents for followup of mitral valve prolapse. He has had this for some time with bileaflet prolapse with severe regurgitation described on a transesophageal echo in 2011.  He did see Dr. Owen in 2012 with consideration given to repair versus close followup as he was asymptomatic with well-preserved ejection fraction. I saw him in 2014. He did not want to consider elective valve repair at that time and part of this was financial. More recently he saw Dr. Nishan. He was referred back to Dr. Owen. He now has symptoms. He describes dyspnea with moderate exertion. This is progressive from previous. Followup echo demonstrated severe mitral regurgitation. He still has a well preserved ejection fraction however. He is not having any chest pressure, neck or arm discomfort. He has not noted any palpitations, presyncope or syncope.   No Known Allergies  Current Outpatient Prescriptions  Medication Sig Dispense Refill  . esomeprazole (NEXIUM) 40 MG capsule Take 40 mg by mouth daily as needed (reflux).       . sulfamethoxazole-trimethoprim (BACTRIM DS) 800-160 MG per tablet Take 1 tablet by mouth 2 (two) times daily as needed (for urinary burning.). Take with 6-8 oz of water. Continue x 3 days only.       No current facility-administered medications for this visit.    Past Medical History  Diagnosis Date  . Mitral valve prolapse     TEE  08/2010 severe MR, bileaflet MVP, EF 60%  . Hypercholesteremia   . Synovitis of hand 2011    right hand  . Leukopenia   . Rhinitis 2011    chronic   . Hepatitis C 2004    pt doesn't want treatment  . Fibromyalgia   . Prostatitis   . Prostadynia   . Mitral regurgitation   . MITRAL VALVE PROLAPSE 02/24/2007    Qualifier: Diagnosis of  By: McGregor, [name]    . GASTROESOPHAGEAL REFLUX, NO ESOPHAGITIS 02/24/2007    Qualifier: Diagnosis of  By: McGregor, [name]    . HEPATITIS C 02/24/2007    Qualifier: Diagnosis of  By: McGregor, [name]    .  BACK PAIN, CHRONIC 12/11/2009    Qualifier: Diagnosis of  By: Breen MD, Kashden Deboy      Past Surgical History  Procedure Laterality Date  . Appendectomy    . Tee without cardioversion N/A 03/20/2013    Procedure: TRANSESOPHAGEAL ECHOCARDIOGRAM (TEE);  Surgeon: Philip J Nahser, MD;  Location: [facility] ENDOSCOPY;  Service: Cardiovascular;  Laterality: N/A;    ROS:   As stated in the HPI and negative for all other systems.  PHYSICAL EXAM BP 137/85  Pulse 75  Ht 5' 8" (1.727 m)  Wt 153 lb 6.4 oz (69.582 kg)  BMI 23.33 kg/m2 GENERAL:  Well appearing NECK:  No jugular venous distention, waveform within normal limits, carotid upstroke brisk and symmetric, no bruits, no thyromegaly LUNGS:  Clear to auscultation bilaterally BACK:  No CVA tenderness CHEST:  Unremarkable HEART:  PMI not displaced or sustained,S1 and S2 within normal limits, no S3, no S4, positive click, no rubs, harsh holosystolic murmur heard at the apex  ABD:  Flat, positive bowel sounds normal in frequency in pitch, no bruits, no rebound, no guarding, no midline pulsatile mass, no hepatomegaly, no splenomegaly EXT:  2 plus pulses throughout, no edema, no cyanosis no clubbing  ASSESSMENT AND PLAN  MVP with severe MR:  The patient has now had symptoms which have progressed since he was last seen. Given this he is now   being prepared for probable mitral valve repair. He is going to get a cardiac catheterization as the next step. The patient understands that risks included but are not limited to stroke (1 in 1000), death (1 in 1000), kidney failure [usually temporary] (1 in 500), bleeding (1 in 200), allergic reaction [possibly serious] (1 in 200).  The patient understands and agrees to proceed.   I will check labs today. All of his questions were answered.  Of note I reviewed the echo and look at the report of the CT angiography is chest. There were no significant abnormalities for this.

## 2014-02-12 NOTE — Patient Instructions (Signed)
Your physician has requested that you have a cardiac catheterization. Cardiac catheterization is used to diagnose and/or treat various heart conditions. Doctors may recommend this procedure for a number of different reasons. The most common reason is to evaluate chest pain. Chest pain can be a symptom of coronary artery disease (CAD), and cardiac catheterization can show whether plaque is narrowing or blocking your heart's arteries. This procedure is also used to evaluate the valves, as well as measure the blood flow and oxygen levels in different parts of your heart. For further information please visit [email or web address] Please follow instruction sheet, as given.  Your physician recommends that you have pre-cath labwork today-bmet, cbc with diff and pt/inr.

## 2014-02-19 ENCOUNTER — Encounter (HOSPITAL_COMMUNITY)
Admission: RE | Disposition: A | Discharge status: Home / Self Care | Payer: Self-pay | Source: Ambulatory Visit | Attending: Interventional Cardiology

## 2014-02-19 ENCOUNTER — Ambulatory Visit (HOSPITAL_COMMUNITY)
Admission: RE | Admit: 2014-02-19 | Discharge: 2014-02-19 | Disposition: A | Discharge status: Home / Self Care | Payer: BC Managed Care – PPO | Source: Ambulatory Visit | Attending: Interventional Cardiology | Admitting: Interventional Cardiology

## 2014-02-19 DIAGNOSIS — K219 Gastro-esophageal reflux disease without esophagitis: Secondary | ICD-10-CM | POA: Insufficient documentation

## 2014-02-19 DIAGNOSIS — B1920 Unspecified viral hepatitis C without hepatic coma: Secondary | ICD-10-CM | POA: Insufficient documentation

## 2014-02-19 DIAGNOSIS — I059 Rheumatic mitral valve disease, unspecified: Secondary | ICD-10-CM

## 2014-02-19 DIAGNOSIS — E7800 Pure hypercholesterolemia, unspecified: Secondary | ICD-10-CM | POA: Insufficient documentation

## 2014-02-19 HISTORY — PX: LEFT AND RIGHT HEART CATHETERIZATION WITH CORONARY ANGIOGRAM: SHX5449

## 2014-02-19 LAB — POCT I-STAT 3, ART BLOOD GAS (G3+)
Bicarbonate: 26 mEq/L — ABNORMAL HIGH (ref 20.0–24.0)
O2 Saturation: 94 %
TCO2: 27 mmol/L (ref 0–100)
pCO2 arterial: 46.8 mmHg — ABNORMAL HIGH (ref 35.0–45.0)
pH, Arterial: 7.352 (ref 7.350–7.450)
pO2, Arterial: 74 mmHg — ABNORMAL LOW (ref 80.0–100.0)

## 2014-02-19 LAB — POCT I-STAT 3, VENOUS BLOOD GAS (G3P V)
Acid-base deficit: 1 mmol/L (ref 0.0–2.0)
Bicarbonate: 25.7 mEq/L — ABNORMAL HIGH (ref 20.0–24.0)
O2 Saturation: 70 %
TCO2: 27 mmol/L (ref 0–100)
pCO2, Ven: 52.4 mmHg — ABNORMAL HIGH (ref 45.0–50.0)
pH, Ven: 7.298 (ref 7.250–7.300)
pO2, Ven: 41 mmHg (ref 30.0–45.0)

## 2014-02-19 SURGERY — LEFT AND RIGHT HEART CATHETERIZATION WITH CORONARY ANGIOGRAM
Anesthesia: LOCAL

## 2014-02-19 MED ORDER — ASPIRIN 81 MG PO CHEW
CHEWABLE_TABLET | ORAL | Status: AC
  Administered 2014-02-19: 81 mg via ORAL
  Filled 2014-02-19: qty 1

## 2014-02-19 MED ORDER — SODIUM CHLORIDE 0.9 % IV SOLN: 250.0000 mL | INTRAVENOUS | Status: DC | PRN

## 2014-02-19 MED ORDER — SODIUM CHLORIDE 0.9 % IV SOLN
INTRAVENOUS | Status: DC
  Administered 2014-02-19: 1000 mL via INTRAVENOUS

## 2014-02-19 MED ORDER — SODIUM CHLORIDE 0.9 % IJ SOLN: 3.0000 mL | Freq: Two times a day (BID) | INTRAMUSCULAR | Status: DC

## 2014-02-19 MED ORDER — FENTANYL CITRATE 0.05 MG/ML IJ SOLN
INTRAMUSCULAR | Status: AC
  Filled 2014-02-19: qty 2

## 2014-02-19 MED ORDER — MIDAZOLAM HCL 2 MG/2ML IJ SOLN
INTRAMUSCULAR | Status: AC
  Filled 2014-02-19: qty 2

## 2014-02-19 MED ORDER — HEPARIN (PORCINE) IN NACL 2-0.9 UNIT/ML-% IJ SOLN
INTRAMUSCULAR | Status: AC
  Filled 2014-02-19: qty 1000

## 2014-02-19 MED ORDER — LIDOCAINE HCL (PF) 1 % IJ SOLN
INTRAMUSCULAR | Status: AC
Start: 2014-02-19 — End: 2014-02-19
  Filled 2014-02-19: qty 30

## 2014-02-19 MED ORDER — ASPIRIN 81 MG PO CHEW
81.0000 mg | CHEWABLE_TABLET | ORAL | Status: AC
  Administered 2014-02-19: 81 mg via ORAL

## 2014-02-19 MED ORDER — NITROGLYCERIN 0.2 MG/ML ON CALL CATH LAB
INTRAVENOUS | Status: AC
Start: 2014-02-19 — End: 2014-02-19
  Filled 2014-02-19: qty 1

## 2014-02-19 MED ORDER — SODIUM CHLORIDE 0.9 % IJ SOLN: 3.0000 mL | INTRAMUSCULAR | Status: DC | PRN

## 2014-02-19 NOTE — CV Procedure (Signed)
    PROCEDURE:  Right heart catheterization, Left heart catheterization with selective coronary angiography, left ventriculogram.  INDICATIONS:  Preoperative evaluation for mitral valve surgery  The risks, benefits, and details of the procedure were explained to the patient.  The patient verbalized understanding and wanted to proceed.  Informed written consent was obtained.  PROCEDURE TECHNIQUE:  After Xylocaine anesthesia a 665F and 65F sheaths were placed in the left femoral vein and artery respectively with  single anterior needle wall sticks.   The Swan-Ganz catheter was advanced to the pulmonary artery. Hemodynamic assessment was performed with this catheter. Left coronary angiography was done using a Judkins L4 guide catheter.  Right coronary angiography was done using a Judkins R4 guide catheter.  Left ventriculography was done using a pigtail catheter. Manual compression will be used for hemostasis.   CONTRAST:  Total of 55 cc.  COMPLICATIONS:  None.    HEMODYNAMICS:  Aortic pressure was 144/78; LV pressure was 146/6; LVEDP 14.  There was no gradient between the left ventricle and aorta.  Pulmonary capillary wedge pressure 14/13.  PA pressure 28/11.  RV pressure 29/3, RVEDP 5 mmHg. RA pressure 7/5. PA sat 70%. AO sat 94%. Cardiac output by Fick 5.5 L per minute. Cardiac index 3.02  ANGIOGRAPHIC DATA:   The left main coronary artery is widely patent.  The left anterior descending artery is a large vessel which wraps around the apex. In the mid LAD, at the origin of a large second diagonal, there is a focal 25% stenosis. The first diagonal is small but patent. The second diagonal is large and widely patent.  The left circumflex artery is a large vessel. There is a long first obtuse marginal which is widely patent. The remainder of the circumflex system appears widely patent.  There is a second obtuse marginal which is patent as well.  The right coronary artery is a large dominant vessel.  There is no significant atherosclerosis noted. The posterior lateral artery is large. The posterior descending artery is small but patent.  LEFT VENTRICULOGRAM:  Left ventricular angiogram was done in the 30 RAO projection and revealed normal left ventricular wall motion and systolic function with an estimated ejection fraction of 60 %.  LVEDP was 14 mmHg.  Significant mitral regurgitation is noted.  IMPRESSIONS:  1. Normal left main coronary artery. 2. Mild disease in the mid left anterior descending artery which is not significant.  Patent diagonal. 3. Normal left circumflex artery and its branches. 4. Normal right coronary artery. 5. Normal left ventricular systolic function.  LVEDP 14 mmHg.  Ejection fraction 60% with significant mitral regurgitation. 6.  Normal pulmonary artery pressure.  Mean PA pressure 18 mm Hg. PA sat 70%. AO sat 94%. Cardiac output by Fick 5.5 L per minute. Cardiac index 3.02  RECOMMENDATION:  No significant coronary artery disease. Proceed with mitral valve repair.

## 2014-02-19 NOTE — Discharge Instructions (Signed)
No lifting more than 10 lbs for a week.  Angiography, Care After Refer to this sheet in the next few weeks. These instructions provide you with information on caring for yourself after your procedure. Your health care provider may also give you more specific instructions. Your treatment has been planned according to current medical practices, but problems sometimes occur. Call your health care provider if you have any problems or questions after your procedure.  WHAT TO EXPECT AFTER THE PROCEDURE After your procedure, it is typical to have the following sensations:  Minor discomfort or tenderness and a small bump at the catheter insertion site. The bump should usually decrease in size and tenderness within 1 to 2 weeks.  Any bruising will usually fade within 2 to 4 weeks. HOME CARE INSTRUCTIONS   You may need to keep taking blood thinners if they were prescribed for you. Only take over-the-counter or prescription medicines for pain, fever, or discomfort as directed by your health care provider.  Do not apply powder or lotion to the site.  Do not sit in a bathtub, swimming pool, or whirlpool for 5 to 7 days.  You may shower 24 hours after the procedure. Remove the bandage (dressing) and gently wash the site with plain soap and water. Gently pat the site dry.  Inspect the site at least twice daily.  Limit your activity for the first 24 hours. Do not bend, squat, or lift anything over 10 lb (9 kg) for one week.  Do not drive home if you are discharged the day of the procedure. Have someone else drive you. Follow instructions about when you can drive or return to work. SEEK MEDICAL CARE IF:  You get lightheaded when standing up.  You have drainage (other than a small amount of blood on the dressing).  You have chills.  You have a fever.  You have redness, warmth, swelling, or pain at the insertion site. SEEK IMMEDIATE MEDICAL CARE IF:   You develop chest pain or shortness of breath,  feel faint, or pass out.  You have bleeding, swelling larger than a walnut, or drainage from the catheter insertion site.  You develop pain, discoloration, coldness, or severe bruising in the leg or arm that held the catheter.  You have heavy bleeding from the site. If this happens, hold pressure on the site. MAKE SURE YOU:  Understand these instructions.  Will watch your condition.  Will get help right away if you are not doing well or get worse. Document Released: 07/02/2005 Document Revised: 08/16/2013 Document Reviewed: 05/08/2013 [facility] Patient Information 2014 [place], [place].

## 2014-02-19 NOTE — Interval H&P Note (Signed)
Cath Lab Visit (complete for each Cath Lab visit)  Clinical Evaluation Leading to the Procedure:   ACS: no  Non-ACS:    Anginal Classification: CCS II Unclear.  Patient preop eval for mitral valve surgery.  Anti-ischemic medical therapy: No Therapy  Non-Invasive Test Results: No non-invasive testing performed  Prior CABG: No previous CABG      History and Physical Interval Note:  02/19/2014 7:46 AM  Bradley Hansen  has presented today for surgery, with the diagnosis of sob  The various methods of treatment have been discussed with the patient and family. After consideration of risks, benefits and other options for treatment, the patient has consented to  Procedure(s): LEFT AND RIGHT HEART CATHETERIZATION WITH CORONARY ANGIOGRAM (N/A) as a surgical intervention .  The patient's history has been reviewed, patient examined, no change in status, stable for surgery.  I have reviewed the patient's chart and labs.  Questions were answered to the patient's satisfaction.     Buddie Marston S.

## 2014-02-19 NOTE — H&P (View-Only) (Signed)
HPI The patient presents for followup of mitral valve prolapse. He has had this for some time with bileaflet prolapse with severe regurgitation described on a transesophageal echo in 2011.  He did see [name] in 2012 with consideration given to repair versus close followup as he was asymptomatic with well-preserved ejection fraction. I saw him in 2014. He did not want to consider elective valve repair at that time and part of this was financial. More recently he saw Dr. [name]. He was referred back to [name]. He now has symptoms. He describes dyspnea with moderate exertion. This is progressive from previous. Followup echo demonstrated severe mitral regurgitation. He still has a well preserved ejection fraction however. He is not having any chest pressure, neck or arm discomfort. He has not noted any palpitations, presyncope or syncope.   No Known Allergies  Current Outpatient Prescriptions  Medication Sig Dispense Refill  . esomeprazole (NEXIUM) 40 MG capsule Take 40 mg by mouth daily as needed (reflux).       Marland Kitchen. sulfamethoxazole-trimethoprim (BACTRIM DS) 800-160 MG per tablet Take 1 tablet by mouth 2 (two) times daily as needed (for urinary burning.). Take with 6-8 oz of water. Continue x 3 days only.       No current facility-administered medications for this visit.    Past Medical History  Diagnosis Date  . Mitral valve prolapse     TEE  08/2010 severe MR, bileaflet MVP, EF 60%  . Hypercholesteremia   . Synovitis of hand 2011    right hand  . Leukopenia   . Rhinitis 2011    chronic   . Hepatitis C 2004    pt doesn't want treatment  . Fibromyalgia   . Prostatitis   . Prostadynia   . Mitral regurgitation   . MITRAL VALVE PROLAPSE 02/24/2007    Qualifier: Diagnosis of  By: [name], [name]    . GASTROESOPHAGEAL REFLUX, NO ESOPHAGITIS 02/24/2007    Qualifier: Diagnosis of  By: [name], [name]    . HEPATITIS C 02/24/2007    Qualifier: Diagnosis of  By: [name], [name]    .  BACK PAIN, CHRONIC 12/11/2009    Qualifier: Diagnosis of  By: [name], [name]      Past Surgical History  Procedure Laterality Date  . Appendectomy    . Tee without cardioversion N/A 03/20/2013    Procedure: TRANSESOPHAGEAL ECHOCARDIOGRAM (TEE);  Surgeon: Vesta MixerPhilip J Nahser, MD;  [facility];  Service: Cardiovascular;  Laterality: N/A;    ROS:   As stated in the HPI and negative for all other systems.  PHYSICAL EXAM BP 137/85  Pulse 75  Ht 5\' 8"  (1.727 m)  Wt 153 lb 6.4 oz (69.582 kg)  BMI 23.33 kg/m2 GENERAL:  Well appearing NECK:  No jugular venous distention, waveform within normal limits, carotid upstroke brisk and symmetric, no bruits, no thyromegaly LUNGS:  Clear to auscultation bilaterally BACK:  No CVA tenderness CHEST:  Unremarkable HEART:  PMI not displaced or sustained,S1 and S2 within normal limits, no S3, no S4, positive click, no rubs, harsh holosystolic murmur heard at the apex  ABD:  Flat, positive bowel sounds normal in frequency in pitch, no bruits, no rebound, no guarding, no midline pulsatile mass, no hepatomegaly, no splenomegaly EXT:  2 plus pulses throughout, no edema, no cyanosis no clubbing  ASSESSMENT AND PLAN  MVP with severe MR:  The patient has now had symptoms which have progressed since he was last seen. Given this he is now  being prepared for probable mitral valve repair. He is going to get a cardiac catheterization as the next step. The patient understands that risks included but are not limited to stroke (1 in 1000), death (1 in 1000), kidney failure [usually temporary] (1 in 500), bleeding (1 in 200), allergic reaction [possibly serious] (1 in 200).  The patient understands and agrees to proceed.   I will check labs today. All of his questions were answered.  Of note I reviewed the echo and look at the report of the CT angiography is chest. There were no significant abnormalities for this.

## 2014-02-26 ENCOUNTER — Ambulatory Visit (INDEPENDENT_AMBULATORY_CARE_PROVIDER_SITE_OTHER): Payer: BC Managed Care – PPO | Admitting: Thoracic Surgery (Cardiothoracic Vascular Surgery)

## 2014-02-26 ENCOUNTER — Encounter: Payer: Self-pay | Admitting: Thoracic Surgery (Cardiothoracic Vascular Surgery)

## 2014-02-26 VITALS — BP 129/83 | HR 85 | Resp 16 | Ht 67.0 in | Wt 155.0 lb

## 2014-02-26 DIAGNOSIS — I341 Nonrheumatic mitral (valve) prolapse: Secondary | ICD-10-CM

## 2014-02-26 DIAGNOSIS — I340 Nonrheumatic mitral (valve) insufficiency: Secondary | ICD-10-CM

## 2014-02-26 DIAGNOSIS — I059 Rheumatic mitral valve disease, unspecified: Secondary | ICD-10-CM

## 2014-02-26 NOTE — Progress Notes (Signed)
[address]       [name] [address]             [id number]     CARDIOTHORACIC SURGERY OFFICE NOTE  Referring Provider is Wendall Stade, MD PCP is [name], MD   HPI:  Patient returns to the office today for followup related to severe symptomatic mitral regurgitation.  He was last seen here in the office 01/29/2014. Since then he underwent left and right heart catheterization demonstrating normal coronary artery anatomy with no significant coronary artery disease. He also underwent CT angiography in anticipation of possible minimally invasive mitral valve repair. He returns to the office to discuss the results of these tests. He reports no new problems or complaints over the last month.   Current Outpatient Prescriptions  Medication Sig Dispense Refill  . esomeprazole (NEXIUM) 40 MG capsule Take 40 mg by mouth daily as needed (reflux).       Marland Kitchen sulfamethoxazole-trimethoprim (BACTRIM DS) 800-160 MG per tablet Take 1 tablet by mouth 2 (two) times daily as needed (for urinary burning.). Take with 6-8 oz of water. Continue x 3 days only.       No current facility-administered medications for this visit.      Physical Exam:   BP 129/83  Pulse 85  Resp 16  Ht 5\' 7"  (1.702 m)  Wt 155 lb (70.308 kg)  BMI 24.27 kg/m2  SpO2 98%  General:  Well-appearing  Chest:   Clear to auscultation  CV:   Regular rate and rhythm with systolic murmur  Incisions:  n/a  Abdomen:  Soft and nontender  Extremities:  Warm and well-perfused  Diagnostic Tests:  CARDIAC CATHETERIZATION  PROCEDURE: Right heart catheterization, Left heart catheterization with selective coronary angiography, left ventriculogram.  INDICATIONS: Preoperative evaluation for mitral valve surgery  The risks, benefits, and details of the procedure were explained to the patient. The patient verbalized understanding and wanted to proceed. Informed written consent was obtained.  PROCEDURE TECHNIQUE:  After Xylocaine anesthesia a 54F and 49F sheaths were placed in the left femoral vein and artery respectively with single anterior needle wall sticks. The Swan-Ganz catheter was advanced to the pulmonary artery. Hemodynamic assessment was performed with this catheter. Left coronary angiography was done using a Judkins L4 guide catheter. Right coronary angiography was done using a Judkins R4 guide catheter. Left ventriculography was done using a pigtail catheter. Manual compression will be used for hemostasis.  CONTRAST: Total of 55 cc.  COMPLICATIONS: None.  HEMODYNAMICS: Aortic pressure was 144/78; LV pressure was 146/6; LVEDP 14. There was no gradient between the left ventricle and aorta. Pulmonary capillary wedge pressure 14/13. PA pressure 28/11. RV pressure 29/3, RVEDP 5 mmHg. RA pressure 7/5. PA sat 70%. AO sat 94%. Cardiac output by Fick 5.5 L per minute. Cardiac index 3.02  ANGIOGRAPHIC DATA: The left main coronary artery is widely patent.  The left anterior descending artery is a large vessel which wraps around the apex. In the mid LAD, at the origin of a large second diagonal, there is a focal 25% stenosis. The first diagonal is small but patent. The second diagonal is large and widely patent.  The left circumflex artery is a large vessel. There is a long first obtuse marginal which is widely patent. The remainder of the circumflex system appears widely patent. There is a second obtuse marginal which is patent as well.  The right coronary artery is a large dominant vessel. There is  no significant atherosclerosis noted. The posterior lateral artery is large. The posterior descending artery is small but patent.  LEFT VENTRICULOGRAM: Left ventricular angiogram was done in the 30 RAO projection and revealed normal left ventricular wall motion and systolic function with an estimated ejection fraction of 60 %. LVEDP was 14 mmHg. Significant mitral regurgitation is noted.  IMPRESSIONS:  1. Normal left  main coronary artery. 2. Mild disease in the mid left anterior descending artery which is not significant. Patent diagonal. 3. Normal left circumflex artery and its branches. 4. Normal right coronary artery. 5. Normal left ventricular systolic function. LVEDP 14 mmHg. Ejection fraction 60% with significant mitral regurgitation. 6. Normal pulmonary artery pressure. Mean PA pressure 18 mm Hg. PA sat 70%. AO sat 94%. Cardiac output by Fick 5.5 L per minute. Cardiac index 3.02  RECOMMENDATION: No significant coronary artery disease. Proceed with mitral valve repair.   CT ANGIOGRAPHY CHEST, ABDOMEN AND PELVIS   TECHNIQUE: Multidetector CT imaging through the chest, abdomen and pelvis was performed using the standard protocol during bolus administration of intravenous contrast. Multiplanar reconstructed images and MIPs were obtained and reviewed to evaluate the vascular anatomy.   CONTRAST:  OMNIPAQUE IOHEXOL 350 MG/ML SOLN   COMPARISON:  None.   FINDINGS: Vascular Findings:   Normal caliber of the thoracic aorta with measurements as follows. Review of the precontrast images are negative for the presence of an intramural hematoma. There is a very minimal amount of calcified atherosclerotic plaque involving the caudal aspect of the aortic arch, not result in hemodynamically significant stenosis. No thoracic aortic dissection or periaortic stranding. Bovine configuration of the aortic arch is incidentally noted. The branch vessels of the aortic arch are widely patent throughout their imaged course. The descending thoracic aorta is of normal caliber.   Cardiomegaly. Calcifications within the mitral valve leaflets. No pericardial effusion.   Although this examination was not tailored for the evaluation of the pulmonary arteries, there are no discrete filling defects within the pulmonary arterial tree to the level of the bilateral subsegmental pulmonary arteries.     -------------------------------------------------------------   Thoracic aortic measurements:   Sinotubular junction   33 mm as measured in greatest oblique coronal dimension.   Proximal ascending aorta   36 mm as measured in greatest oblique axial dimension at the level of the main pulmonary artery and approximately 37 mm in greatest oblique coronal dimension (coronal image 39, series 601).   Aortic arch aorta   27 mm as measured in greatest oblique sagittal dimension.   Proximal descending thoracic aorta   25 mm as measured in greatest oblique axial dimension at the level of the main pulmonary artery.   Distal descending thoracic aorta   24 mm as measured in greatest oblique axial dimension at the level of the diaphragmatic hiatus.   Review of the MIP images confirms the above findings.   -------------------------------------------------------------   Non-Vascular Findings of the chest:   There is minimal grossly symmetric subpleural ground-glass atelectasis. No focal airspace opacities. No pleural effusion. There is a punctate (approximately 2 mm) noncalcified subpleural nodule within the left upper lobe (image 20, series 5 as well as a punctate (2 mm) noncalcified nodule within the right upper lobe (image 25, series 5). The central pulmonary airways are widely patent. No mediastinal, hilar or axillary lymphadenopathy.   No acute or aggressive osseous abnormalities within the chest. Normal appearance of the thyroid gland.   --------------------------------------------------------------   Vascular Findings of the abdomen and pelvis:  Abdominal aorta: Normal caliber the abdominal aorta. The abdominal aorta is widely patent without hemodynamically significant narrowing. No abdominal aortic dissection or periaortic stranding.   Celiac artery: Note is made of a common trunk of the celiac and the SMA. The celiac artery otherwise appears normal and widely  patent throughout its imaged course.   SMA: Note is made of a common origin of the celiac and SMA. The SMA is otherwise normal and widely patent throughout its imaged course.   Right Renal artery: Solitary; there is a minimal amount of mixed calcified and noncalcified atherosclerotic plaque involving the caudal proximal aspect of the right renal artery, not resulting in hemodynamically significant stenosis.   Left Renal artery: Solitary; widely patent without hemodynamically significant narrowing.   IMA: Patent   Pelvic vasculature: The bilateral common and external iliac arteries are widely patent and free if clinically significant narrowing. The bilateral internal iliac arteries are disease proximally with mixed calcified and noncalcified atherosclerotic plaque low remain patent and of normal caliber.   Review of the MIP images confirms the above findings.     --------------------------------------------------------------------------------   Nonvascular Findings of the abdomen and pelvis:   Normal hepatic contour. No discrete hepatic lesions. No appears of the gallbladder. No intra extrahepatic biliary duct dilatation no ascites.   There is symmetric enhancement and excretion of the bilateral kidneys. No definite renal stones on this postcontrast examination. No discrete renal lesions. No urinary obstruction or perinephric stranding. Normal appearance of the bilateral adrenal glands and pancreas. Crescentic calcifications are noted about the splenic capsule (represented axial image 18, series 2). No perisplenic fluid.   Moderate colonic stool burden without evidence of obstruction. The bowel is otherwise normal in course and caliber without discrete area of wall thickening or obstruction. Normal appearance of the appendix. No pneumoperitoneum, pneumatosis or portal venous gas.   No retroperitoneal, mesenteric, pelvic or inguinal lymphadenopathy.   The prostate is  enlarged. There is mild diffuse thickening of the urinary bladder wall, possibly accentuated due to underdistention. No free fluid within the pelvis.   No acute or aggressive osseus abnormalities within the abdomen or pelvis. There is a mild (or 25%) compression deformity involving the superior endplate of the L3 vertebral body without associated retropulsion or discrete fracture line. Mild to moderate multilevel lumbar spine DDD.   IMPRESSION: 1. No evidence of thoracic or abdominal aortic aneurysm or dissection. 2. Cardiomegaly.  Calcifications within the mitral valve annulus. 3. Indeterminate bilateral punctate (approximately 2 mm) pulmonary nodules. Comparison to prior outside examinations (if available) is recommended. Otherwise, if the patient is at high risk for bronchogenic carcinoma, follow-up chest CT at 1 year is recommended. If the patient is at low risk, no follow-up is needed. This recommendation follows the consensus statement: Guidelines for Management of Small Pulmonary Nodules Detected on CT Scans: A Statement from the Fleischner Society as published in Radiology 2005; 237:395-400. 4. Linear calcifications within the splenic capsule, nonspecific though presumably the sequela of remote injury and/or infection. 5. Enlarged prostate with mass effect upon the undersurface of the bladder. This finding is associated with apparent thickening of the urinary bladder wall possibly indicative of bladder outlet obstruction. Clinical correlation is advised.     Electronically Signed   By: Simonne Come M.D.   On: 02/05/2014 16:20     Impression:  Patient has long-standing mitral valve prolapse with severe mitral regurgitation. She has recently developed stable symptoms of exertional shortness of breath, [place] Heart Association functional class II.  Cardiac catheterization is notable for absence of significant coronary artery disease. Pulmonary artery pressures are normal.  CT angiography demonstrates findings relatively favorable for minimally invasive approach for surgery.   Plan:  I've discussed the results of these recent tests with Mr. Opheim in the office today. He desires to schedule surgery sometime in April for practical reasons. He will return in 4 weeks to make final plans for surgery.   I spent in excess of 15 minutes during the conduct of this office consultation and >50% of this time involved direct face-to-face encounter with the patient for counseling and/or coordination of their care.  Salvatore Decent. Cornelius Moras, MD 02/26/2014 2:19 PM

## 2014-03-26 ENCOUNTER — Encounter: Payer: Self-pay | Admitting: Thoracic Surgery (Cardiothoracic Vascular Surgery)

## 2014-03-26 ENCOUNTER — Other Ambulatory Visit: Payer: Self-pay | Admitting: *Deleted

## 2014-03-26 ENCOUNTER — Ambulatory Visit (INDEPENDENT_AMBULATORY_CARE_PROVIDER_SITE_OTHER): Payer: BC Managed Care – PPO | Admitting: Thoracic Surgery (Cardiothoracic Vascular Surgery)

## 2014-03-26 VITALS — BP 125/73 | HR 88 | Resp 16 | Ht 67.0 in | Wt 155.0 lb

## 2014-03-26 DIAGNOSIS — I059 Rheumatic mitral valve disease, unspecified: Secondary | ICD-10-CM

## 2014-03-26 DIAGNOSIS — I340 Nonrheumatic mitral (valve) insufficiency: Secondary | ICD-10-CM

## 2014-03-26 DIAGNOSIS — I341 Nonrheumatic mitral (valve) prolapse: Secondary | ICD-10-CM

## 2014-03-26 MED ORDER — AMIODARONE HCL 200 MG PO TABS: 200.0000 mg | ORAL_TABLET | Freq: Two times a day (BID) | ORAL | Status: DC

## 2014-03-26 NOTE — Patient Instructions (Signed)
Begin taking amiodarone 1 tablet twice daily 7 days before your scheduled surgery

## 2014-03-26 NOTE — Progress Notes (Signed)
      [address]       [name] [address]             [phone]     CARDIOTHORACIC SURGERY OFFICE NOTE  Referring Provider is Wendall Stade, MD PCP is [name], MD   HPI:  Patient returns to the office today for followup related to severe symptomatic mitral regurgitation. He was last seen here in the office 02/26/2014.  Since then he reports no new problems or complaints and he states he is ready to schedule elective mitral valve repair next month.    Current Outpatient Prescriptions  Medication Sig Dispense Refill  . esomeprazole (NEXIUM) 40 MG capsule Take 40 mg by mouth daily as needed (reflux).       Marland Kitchen sulfamethoxazole-trimethoprim (BACTRIM DS) 800-160 MG per tablet Take 1 tablet by mouth 2 (two) times daily as needed (for urinary burning.). Take with 6-8 oz of water. Continue x 3 days only.       No current facility-administered medications for this visit.      Physical Exam:   BP 125/73  Pulse 88  Resp 16  Ht 5\' 7"  (1.702 m)  Wt 155 lb (70.308 kg)  BMI 24.27 kg/m2  SpO2 99%  General:  Well-appearing  Chest:   Clear to auscultation  CV:   Regular rate and rhythm with systolic murmur  Incisions:  n/a  Abdomen:  Soft and nontender  Extremities:  Warm and well-perfused  Diagnostic Tests:  n/a   Impression:  Patient has long-standing mitral valve prolapse with severe mitral regurgitation. She has recently developed stable symptoms of exertional shortness of breath, New York Heart Association functional class II. Cardiac catheterization is notable for absence of significant coronary artery disease. Pulmonary artery pressures are normal. CT angiography demonstrates findings relatively favorable for minimally invasive approach for surgery.   Plan:  We plan to proceed with elective minimally invasive mitral valve repair on Tuesday, 04/24/2014. The patient has been given a prescription for amiodarone to begin one week prior to surgery to  decrease his risk of perioperative atrial arrhythmias. He will return for followup on Monday, 04/23/2014 prior to surgery.   Salvatore Decent. Cornelius Moras, MD 03/26/2014 1:52 PM

## 2014-04-09 ENCOUNTER — Encounter (HOSPITAL_COMMUNITY): Payer: Self-pay

## 2014-04-20 NOTE — Pre-Procedure Instructions (Signed)
Rudi CocoKatie D Luddy  04/20/2014   Your procedure is scheduled on:  Tues, April 28 @ 7:30 AM  [facility] AM.  Call this number if you have problems the morning of surgery: [phone]   Remember:   Do not eat food or drink liquids after midnight.   Take these medicines the morning of surgery with A SIP OF WATER: Amiodarone(Pacerone) and Nexium(Esomeprazole)              No Goody's,BC's,Aleve,Aspirin,Ibuprofen,Fish Oil,or any Herbal Medications   Do not wear jewelry, make-up or nail polish.  Do not wear lotions, powders, or perfumes.   Do not shave 48 hours prior to surgery.   Do not bring valuables to the hospital.  [facility]                  for any belongings or valuables.               Contacts, dentures or bridgework may not be worn into surgery.  Leave suitcase in the car. After surgery it may be brought to your room.  For patients admitted to the hospital, discharge time is determined by your                treatment team.                 Special Instructions:  [facility] - Preparing for Surgery  Before surgery, you can play an important role.  Because skin is not sterile, your skin needs to be as free of germs as possible.  You can reduce the number of germs on you skin by washing with CHG (chlorahexidine gluconate) soap before surgery.  CHG is an antiseptic cleaner which kills germs and bonds with the skin to continue killing germs even after washing.  Please DO NOT use if you have an allergy to CHG or antibacterial soaps.  If your skin becomes reddened/irritated stop using the CHG and inform your nurse when you arrive at Short Stay.  Do not shave (including legs and underarms) for at least 48 hours prior to the first CHG shower.  You may shave your face.  Please follow these instructions carefully:   1.  Shower with CHG Soap the night before surgery and the                                morning of Surgery.  2.  If you choose to  wash your hair, wash your hair first as usual with your       normal shampoo.  3.  After you shampoo, rinse your hair and body thoroughly to remove the                      Shampoo.  4.  Use CHG as you would any other liquid soap.  You can apply chg directly       to the skin and wash gently with scrungie or a clean washcloth.  5.  Apply the CHG Soap to your body ONLY FROM THE NECK DOWN.        Do not use on open wounds or open sores.  Avoid contact with your eyes,       ears, mouth and genitals (private parts).  Wash genitals (private parts)       with your normal soap.  6.  Wash thoroughly, paying special attention to the area where your surgery        will be performed.  7.  Thoroughly rinse your body with warm water from the neck down.  8.  DO NOT shower/wash with your normal soap after using and rinsing off       the CHG Soap.  9.  Pat yourself dry with a clean towel.            10.  Wear clean pajamas.            11.  Place clean sheets on your bed the night of your first shower and do not        sleep with pets.  Day of Surgery  Do not apply any lotions/deoderants the morning of surgery.  Please wear clean clothes to the hospital/surgery center.     Please read over the following fact sheets that you were given: Pain Booklet, Coughing and Deep Breathing, Blood Transfusion Information, Open Heart Packet, MRSA Information and Surgical Site Infection Prevention

## 2014-04-23 ENCOUNTER — Encounter: Payer: Self-pay | Admitting: Thoracic Surgery (Cardiothoracic Vascular Surgery)

## 2014-04-23 ENCOUNTER — Telehealth: Payer: Self-pay | Admitting: Thoracic Surgery (Cardiothoracic Vascular Surgery)

## 2014-04-23 ENCOUNTER — Ambulatory Visit (INDEPENDENT_AMBULATORY_CARE_PROVIDER_SITE_OTHER): Payer: BC Managed Care – PPO | Admitting: Thoracic Surgery (Cardiothoracic Vascular Surgery)

## 2014-04-23 ENCOUNTER — Encounter (HOSPITAL_COMMUNITY): Payer: Self-pay | Admitting: Certified Registered Nurse Anesthetist

## 2014-04-23 ENCOUNTER — Encounter (HOSPITAL_COMMUNITY)
Admission: RE | Admit: 2014-04-23 | Discharge: 2014-04-23 | Disposition: A | Discharge status: Home / Self Care | Payer: BC Managed Care – PPO | Source: Ambulatory Visit | Attending: Thoracic Surgery (Cardiothoracic Vascular Surgery) | Admitting: Thoracic Surgery (Cardiothoracic Vascular Surgery)

## 2014-04-23 ENCOUNTER — Ambulatory Visit (HOSPITAL_COMMUNITY)
Admission: RE | Admit: 2014-04-23 | Discharge: 2014-04-23 | Disposition: A | Discharge status: Home / Self Care | Payer: BC Managed Care – PPO | Source: Ambulatory Visit | Attending: Thoracic Surgery (Cardiothoracic Vascular Surgery) | Admitting: Thoracic Surgery (Cardiothoracic Vascular Surgery)

## 2014-04-23 ENCOUNTER — Encounter (HOSPITAL_COMMUNITY): Payer: Self-pay

## 2014-04-23 VITALS — BP 147/84 | HR 67 | Temp 98.4°F | Resp 20 | Ht 67.0 in | Wt 158.3 lb

## 2014-04-23 VITALS — BP 140/82 | HR 68 | Resp 20 | Ht 67.0 in | Wt 158.0 lb

## 2014-04-23 DIAGNOSIS — Z01810 Encounter for preprocedural cardiovascular examination: Secondary | ICD-10-CM

## 2014-04-23 DIAGNOSIS — I059 Rheumatic mitral valve disease, unspecified: Secondary | ICD-10-CM | POA: Insufficient documentation

## 2014-04-23 DIAGNOSIS — I340 Nonrheumatic mitral (valve) insufficiency: Secondary | ICD-10-CM

## 2014-04-23 DIAGNOSIS — I341 Nonrheumatic mitral (valve) prolapse: Secondary | ICD-10-CM

## 2014-04-23 DIAGNOSIS — I658 Occlusion and stenosis of other precerebral arteries: Secondary | ICD-10-CM | POA: Insufficient documentation

## 2014-04-23 DIAGNOSIS — I6529 Occlusion and stenosis of unspecified carotid artery: Secondary | ICD-10-CM | POA: Insufficient documentation

## 2014-04-23 LAB — COMPREHENSIVE METABOLIC PANEL
ALT: 18 U/L (ref 0–53)
AST: 20 U/L (ref 0–37)
Albumin: 3.7 g/dL (ref 3.5–5.2)
Alkaline Phosphatase: 63 U/L (ref 39–117)
BUN: 11 mg/dL (ref 6–23)
CO2: 25 mEq/L (ref 19–32)
Calcium: 9.2 mg/dL (ref 8.4–10.5)
Chloride: 102 mEq/L (ref 96–112)
Creatinine, Ser: 0.94 mg/dL (ref 0.50–1.35)
GFR calc Af Amer: >90 mL/min (ref >90)
GFR calc non Af Amer: 88 mL/min — ABNORMAL LOW (ref >90)
Glucose, Bld: 71 mg/dL (ref 70–99)
Potassium: 3.5 mEq/L — ABNORMAL LOW (ref 3.7–5.3)
Sodium: 139 mEq/L (ref 137–147)
Total Bilirubin: 0.7 mg/dL (ref 0.3–1.2)
Total Protein: 7.7 g/dL (ref 6.0–8.3)

## 2014-04-23 LAB — URINALYSIS, ROUTINE W REFLEX MICROSCOPIC
Bilirubin Urine: NEGATIVE
Glucose, UA: NEGATIVE mg/dL
Hgb urine dipstick: NEGATIVE
Ketones, ur: NEGATIVE mg/dL
Leukocytes, UA: NEGATIVE
Nitrite: NEGATIVE
Protein, ur: NEGATIVE mg/dL
Specific Gravity, Urine: 1.023 (ref 1.005–1.030)
Urobilinogen, UA: 1 mg/dL (ref 0.0–1.0)
pH: 7 (ref 5.0–8.0)

## 2014-04-23 LAB — HEMOGLOBIN A1C
Hgb A1c MFr Bld: 5 % (ref <5.7)
Mean Plasma Glucose: 97 mg/dL (ref <117)

## 2014-04-23 LAB — CBC
HCT: 37 % — ABNORMAL LOW (ref 39.0–52.0)
Hemoglobin: 12.4 g/dL — ABNORMAL LOW (ref 13.0–17.0)
MCH: 30.8 pg (ref 26.0–34.0)
MCHC: 33.5 g/dL (ref 30.0–36.0)
MCV: 92 fL (ref 78.0–100.0)
Platelets: 180 10*3/uL (ref 150–400)
RBC: 4.02 MIL/uL — ABNORMAL LOW (ref 4.22–5.81)
RDW: 11.9 % (ref 11.5–15.5)
WBC: 4 10*3/uL (ref 4.0–10.5)

## 2014-04-23 LAB — APTT: aPTT: 29 seconds (ref 24–37)

## 2014-04-23 LAB — SURGICAL PCR SCREEN
MRSA, PCR: NEGATIVE
Staphylococcus aureus: NEGATIVE

## 2014-04-23 LAB — PROTIME-INR
INR: 1.13 (ref 0.00–1.49)
Prothrombin Time: 14.3 seconds (ref 11.6–15.2)

## 2014-04-23 LAB — ABO/RH: ABO/RH(D): A POS

## 2014-04-23 MED ORDER — SODIUM CHLORIDE 0.9 % IV SOLN
INTRAVENOUS | Status: AC
  Filled 2014-04-23: qty 30

## 2014-04-23 MED ORDER — DEXTROSE 5 % IV SOLN
30.0000 ug/min | INTRAVENOUS | Status: AC
  Filled 2014-04-23: qty 2

## 2014-04-23 MED ORDER — VANCOMYCIN HCL 1000 MG IV SOLR
INTRAVENOUS | Status: AC
  Filled 2014-04-23: qty 1000

## 2014-04-23 MED ORDER — POTASSIUM CHLORIDE 2 MEQ/ML IV SOLN
80.0000 meq | INTRAVENOUS | Status: AC
  Filled 2014-04-23: qty 40

## 2014-04-23 MED ORDER — MAGNESIUM SULFATE 50 % IJ SOLN
40.0000 meq | INTRAMUSCULAR | Status: AC
  Filled 2014-04-23: qty 10

## 2014-04-23 MED ORDER — SODIUM CHLORIDE 0.9 % IV SOLN
INTRAVENOUS | Status: AC
  Filled 2014-04-23: qty 1

## 2014-04-23 MED ORDER — SODIUM CHLORIDE 0.9 % IV SOLN
INTRAVENOUS | Status: AC
  Filled 2014-04-23: qty 40

## 2014-04-23 MED ORDER — DEXTROSE 5 % IV SOLN
1.5000 g | INTRAVENOUS | Status: AC
  Filled 2014-04-23: qty 1.5

## 2014-04-23 MED ORDER — CHLORHEXIDINE GLUCONATE 4 % EX LIQD
30.0000 mL | CUTANEOUS | Status: DC
Start: 2014-04-23 — End: 2014-04-24

## 2014-04-23 MED ORDER — DEXTROSE 5 % IV SOLN
750.0000 mg | INTRAVENOUS | Status: AC
  Filled 2014-04-23: qty 750

## 2014-04-23 MED ORDER — ALBUTEROL SULFATE (2.5 MG/3ML) 0.083% IN NEBU
2.5000 mg | INHALATION_SOLUTION | Freq: Once | RESPIRATORY_TRACT | Status: AC
  Administered 2014-04-23: 2.5 mg via RESPIRATORY_TRACT

## 2014-04-23 MED ORDER — VANCOMYCIN HCL 10 G IV SOLR
1250.0000 mg | INTRAVENOUS | Status: AC
  Filled 2014-04-23: qty 1250

## 2014-04-23 MED ORDER — PLASMA-LYTE 148 IV SOLN
INTRAVENOUS | Status: AC
  Filled 2014-04-23: qty 2.5

## 2014-04-23 MED ORDER — DEXMEDETOMIDINE HCL IN NACL 400 MCG/100ML IV SOLN
0.1000 ug/kg/h | INTRAVENOUS | Status: AC
  Filled 2014-04-23: qty 100

## 2014-04-23 MED ORDER — NITROGLYCERIN IN D5W 200-5 MCG/ML-% IV SOLN
2.0000 ug/min | INTRAVENOUS | Status: AC
  Filled 2014-04-23: qty 250

## 2014-04-23 MED ORDER — EPINEPHRINE HCL 1 MG/ML IJ SOLN
0.5000 ug/min | INTRAVENOUS | Status: AC
  Filled 2014-04-23: qty 4

## 2014-04-23 MED ORDER — DEXMEDETOMIDINE HCL IN NACL 400 MCG/100ML IV SOLN: 0.1000 ug/kg/h | INTRAVENOUS | Status: DC

## 2014-04-23 MED ORDER — DOPAMINE-DEXTROSE 3.2-5 MG/ML-% IV SOLN: 2.0000 ug/kg/min | INTRAVENOUS | Status: AC

## 2014-04-23 MED ORDER — METOPROLOL TARTRATE 12.5 MG HALF TABLET
12.5000 mg | ORAL_TABLET | Freq: Once | ORAL | Status: AC
  Administered 2014-04-26: 12.5 mg via ORAL
  Filled 2014-04-23: qty 1

## 2014-04-23 NOTE — Progress Notes (Signed)
[address]       [place] [id number]             [phone]     CARDIOTHORACIC SURGERY OFFICE NOTE  Referring Provider is [name], [name], MD PCP is [name], [name], MD   HPI:  Patient returns for followup of severe symptomatic primary mitral regurgitation with plans to proceed with elective minimally invasive mitral valve repair tomorrow. He was last seen in the office on 03/26/2014. Since then he reports no new problems or complaints.   Current Outpatient Prescriptions  Medication Sig Dispense Refill  . amiodarone (PACERONE) 200 MG tablet Take 1 tablet (200 mg total) by mouth 2 (two) times daily. Begin 7 days prior to surgery.  14 tablet  0  . esomeprazole (NEXIUM) 40 MG capsule Take 40 mg by mouth daily as needed (reflux).       Marland Kitchen. sulfamethoxazole-trimethoprim (BACTRIM DS) 800-160 MG per tablet Take 1 tablet by mouth 2 (two) times daily as needed (for urinary burning.). Take with 6-8 oz of water. Continue x 3 days only.       No current facility-administered medications for this visit.   Facility-Administered Medications Ordered in Other Visits  Medication Dose Route Frequency Provider Last Rate Last Dose  . [START ON 04/24/2014] aminocaproic acid (AMICAR) 10 g in sodium chloride 0.9 % 100 mL infusion   Intravenous To OR Purcell Nailslarence H Zyasia Halbleib, MD      . Melene Muller[START ON 04/24/2014] cefUROXime (ZINACEF) 1.5 g in dextrose 5 % 50 mL IVPB  1.5 g Intravenous To OR Purcell Nailslarence H Jhania Etherington, MD      . Melene Muller[START ON 04/24/2014] cefUROXime (ZINACEF) 750 mg in dextrose 5 % 50 mL IVPB  750 mg Intravenous To OR Purcell Nailslarence H Zemira Zehring, MD      . chlorhexidine (HIBICLENS) 4 % liquid 2 application  30 mL Topical UD Purcell Nailslarence H Merle Cirelli, MD      . Melene Muller[START ON 04/24/2014] dexmedetomidine (PRECEDEX) 400 MCG/100ML infusion  0.1-0.7 mcg/kg/hr Intravenous To OR Purcell Nailslarence H Abdulloh Ullom, MD      . Melene Muller[START ON 04/24/2014] DOPamine (INTROPIN) 800 mg in dextrose 5 % 250 mL infusion  2-20 mcg/kg/min Intravenous To OR Purcell Nailslarence H Kerrington Greenhalgh, MD       . Melene Muller[START ON 04/24/2014] EPINEPHrine (ADRENALIN) 4,000 mcg in dextrose 5 % 250 mL infusion  0.5-20 mcg/min Intravenous To OR Purcell Nailslarence H Jomaira Darr, MD      . Melene Muller[START ON 04/24/2014] heparin 2,500 Units, papaverine 30 mg in electrolyte-148 (PLASMALYTE-148) 500 mL irrigation   Irrigation To OR Purcell Nailslarence H Kadien Lineman, MD      . Melene Muller[START ON 04/24/2014] heparin 30,000 units/NS 1000 mL solution for CELLSAVER   Other To OR Purcell Nailslarence H Avanish Cerullo, MD      . Melene Muller[START ON 04/24/2014] insulin regular (NOVOLIN R,HUMULIN R) 1 Units/mL in sodium chloride 0.9 % 100 mL infusion   Intravenous To OR Purcell Nailslarence H Anabell Swint, MD      . Melene Muller[START ON 04/24/2014] magnesium sulfate (IV Push/IM) injection 40 mEq  40 mEq Other To OR Purcell Nailslarence H Abdel Effinger, MD      . Melene Muller[START ON 04/24/2014] metoprolol tartrate (LOPRESSOR) tablet 12.5 mg  12.5 mg Oral Once Purcell Nailslarence H Jillienne Egner, MD      . Melene Muller[START ON 04/24/2014] nitroGLYCERIN 0.2 mg/mL in dextrose 5 % infusion  2-200 mcg/min Intravenous To OR Purcell Nailslarence H Channon Brougher, MD      . Melene Muller[START ON 04/24/2014] phenylephrine (NEO-SYNEPHRINE) 20 mg in dextrose 5 % 250 mL  infusion  30-200 mcg/min Intravenous To OR Purcell Nails, MD      . Melene Muller ON 04/24/2014] potassium chloride injection 80 mEq  80 mEq Other To OR Purcell Nails, MD      . Melene Muller ON 04/24/2014] vancomycin (VANCOCIN) 1,000 mg in sodium chloride 0.9 % 1,000 mL irrigation   Irrigation To OR Purcell Nails, MD      . Melene Muller ON 04/24/2014] vancomycin (VANCOCIN) 1,250 mg in sodium chloride 0.9 % 250 mL IVPB  1,250 mg Intravenous To OR Purcell Nails, MD          Physical Exam:   BP 140/82  Pulse 68  Resp 20  Ht 5\' 7"  (1.702 m)  Wt 158 lb (71.668 kg)  BMI 24.74 kg/m2  SpO2 98%  General:  Well-appearing  Chest:   Clear  CV:   Regular rate and rhythm with holosystolic murmur  Incisions:  n/a  Abdomen:  Soft  Extremities:  Warm  Diagnostic Tests:  n/a   Impression:  Patient has long-standing mitral valve prolapse with severe mitral regurgitation. He has recently developed stable  symptoms of exertional shortness of breath, New York Heart Association functional class II. Cardiac catheterization is notable for absence of significant coronary artery disease. Pulmonary artery pressures are normal. CT angiography demonstrates findings relatively favorable for minimally invasive approach for surgery.   Plan:  The rationale for elective mitral valve repair surgery has been explained, including a comparison between surgery and continued medical therapy with close follow-up.  The likelihood of successful and durable valve repair has been discussed with particular reference to the findings of their recent echocardiogram.  Based upon these findings and previous experience, I have quoted them a greater than 95 percent likelihood of successful valve repair.  In the unlikely event that their valve cannot be successfully repaired, we discussed the possibility of replacing the mitral valve using a mechanical prosthesis with the attendant need for long-term anticoagulation versus the alternative of replacing it using a bioprosthetic tissue valve with its potential for late structural valve deterioration and failure, depending upon the patient's longevity.  The patient specifically requests that if the mitral valve must be replaced that it be done using a mechanical valve.  Alternative surgical approaches have been discussed including a comparison between conventional sternotomy and minimally-invasive techniques.  The relative risks and benefits of each have been reviewed as they pertain to the patient's specific circumstances, and all of their questions have been addressed.  Specific risks potentially related to the minimally-invasive approach were discussed at length, including but not limited to risk of conversion to full or partial sternotomy, aortic dissection or other major vascular complication, unilateral acute lung injury or pulmonary edema, phrenic nerve dysfunction or paralysis, rib fracture,  chronic pain, lung hernia, or lymphocele.  He additionally understands and accepts all other risks associated with surgery including but not limited to risk of death, stroke or other neurologic complication, myocardial infarction, congestive heart failure, respiratory failure, renal failure, bleeding requiring transfusion and/or reexploration, arrhythmia, infection or other wound complications, pneumonia, pleural and/or pericardial effusion, pulmonary embolus, aortic dissection or other major vascular complication, or delayed complications related to valve repair or replacement including but not limited to structural valve deterioration and failure, thrombosis, embolization, endocarditis, or paravalvular leak.  All of their questions have been answered.    I spent in excess of 15 minutes during the conduct of this office consultation and >50% of this time involved direct face-to-face encounter with  the patient for counseling and/or coordination of their care.  Salvatore Decent. Cornelius Moras, MD 04/23/2014 2:55 PM

## 2014-04-23 NOTE — Progress Notes (Signed)
Pre-op Cardiac Surgery  Carotid Findings:  Findings suggest 1-39% internal carotid artery stenosis bilaterally. Vertebral arteries are patent with antegrade flow.  Upper Extremity Right Left  Brachial Pressures 132-Triphasic 139-Triphasic  Radial Waveforms Triphasic Triphasic  Ulnar Waveforms Triphasic Triphasic  Palmar Arch (Allen's Test) Signal is unaffected with radial compression, decreases greater than 50% with ulnar compression. Signal obliterates with radial compression, is unaffected with ulnar compression.   04/23/2014 12:14 PM Gertie Fey, RVT, RDCS, RDMS

## 2014-04-23 NOTE — Patient Instructions (Signed)
Nothing to eat or drink after midnight tonight

## 2014-04-23 NOTE — Progress Notes (Signed)
Patient will need stat ABG's on arrival day of surgery .Blood drawn 04/23/14 was venous blood.

## 2014-04-23 NOTE — Telephone Encounter (Signed)
I spoke with Mr Bradley Hansen on the phone.  We have had to postpone his surgery until Thursday 4/30.  He was instructed to come in at 5:30am on Thursday.  He was agreeable with the plan.  Purcell Nails 04/23/2014 6:03 PM

## 2014-04-25 MED ORDER — INSULIN REGULAR HUMAN 100 UNIT/ML IJ SOLN
INTRAMUSCULAR | Status: AC
  Administered 2014-04-26: 1.1 [IU]/h via INTRAVENOUS
  Filled 2014-04-25: qty 1

## 2014-04-25 MED ORDER — PHENYLEPHRINE HCL 10 MG/ML IJ SOLN
30.0000 ug/min | INTRAVENOUS | Status: AC
  Administered 2014-04-26: 10 ug/min via INTRAVENOUS
  Filled 2014-04-25: qty 2

## 2014-04-25 MED ORDER — SODIUM CHLORIDE 0.9 % IV SOLN
INTRAVENOUS | Status: DC
  Filled 2014-04-25: qty 30

## 2014-04-25 MED ORDER — VANCOMYCIN HCL 1000 MG IV SOLR
INTRAVENOUS | Status: AC
  Administered 2014-04-26: 11:00:00
  Filled 2014-04-25: qty 1000

## 2014-04-25 MED ORDER — NITROGLYCERIN IN D5W 200-5 MCG/ML-% IV SOLN
2.0000 ug/min | INTRAVENOUS | Status: DC
Start: 2014-04-26 — End: 2014-04-26
  Filled 2014-04-25: qty 250

## 2014-04-25 MED ORDER — MAGNESIUM SULFATE 50 % IJ SOLN
40.0000 meq | INTRAMUSCULAR | Status: DC
  Filled 2014-04-25: qty 10

## 2014-04-25 MED ORDER — PAPAVERINE HCL 30 MG/ML IJ SOLN
INTRAMUSCULAR | Status: AC
  Administered 2014-04-26: 09:00:00
  Filled 2014-04-25: qty 2.5

## 2014-04-25 MED ORDER — DOPAMINE-DEXTROSE 3.2-5 MG/ML-% IV SOLN
2.0000 ug/kg/min | INTRAVENOUS | Status: DC
  Filled 2014-04-25: qty 250

## 2014-04-25 MED ORDER — DEXMEDETOMIDINE HCL IN NACL 400 MCG/100ML IV SOLN
0.1000 ug/kg/h | INTRAVENOUS | Status: AC
  Administered 2014-04-26: 0.3 ug/kg/h via INTRAVENOUS
  Administered 2014-04-26: .7 ug/kg/h via INTRAVENOUS
  Filled 2014-04-25: qty 100

## 2014-04-25 MED ORDER — GLUTARALDEHYDE 0.625% SOAKING SOLUTION
TOPICAL | Status: DC | PRN
  Filled 2014-04-25: qty 50

## 2014-04-25 MED ORDER — SODIUM CHLORIDE 0.9 % IV SOLN
INTRAVENOUS | Status: AC
  Administered 2014-04-26: 69.8 mL/h via INTRAVENOUS
  Administered 2014-04-26: 14:00:00 via INTRAVENOUS
  Filled 2014-04-25: qty 40

## 2014-04-25 MED ORDER — EPINEPHRINE HCL 1 MG/ML IJ SOLN
0.5000 ug/min | INTRAMUSCULAR | Status: DC
  Filled 2014-04-25: qty 4

## 2014-04-25 MED ORDER — DEXTROSE 5 % IV SOLN
1.5000 g | INTRAVENOUS | Status: AC
  Administered 2014-04-26: .75 g via INTRAVENOUS
  Administered 2014-04-26: 1.5 g via INTRAVENOUS
  Filled 2014-04-25 (×2): qty 1.5

## 2014-04-25 MED ORDER — DEXTROSE 5 % IV SOLN
750.0000 mg | INTRAVENOUS | Status: DC
  Filled 2014-04-25 (×2): qty 750

## 2014-04-25 MED ORDER — POTASSIUM CHLORIDE 2 MEQ/ML IV SOLN
80.0000 meq | INTRAVENOUS | Status: DC
  Filled 2014-04-25: qty 40

## 2014-04-25 MED ORDER — VANCOMYCIN HCL 10 G IV SOLR
1250.0000 mg | INTRAVENOUS | Status: DC
  Filled 2014-04-25 (×2): qty 1250

## 2014-04-25 NOTE — Progress Notes (Signed)
Called pt to verify surgery time and time of arrival. Pt already aware that he needs to be here at 5:30 AM tomorrow. Also instructed him to follow instructions that he received at his PAT appt on Monday. He voiced understanding.

## 2014-04-26 ENCOUNTER — Encounter (HOSPITAL_COMMUNITY): Payer: BC Managed Care – PPO | Admitting: Certified Registered Nurse Anesthetist

## 2014-04-26 ENCOUNTER — Inpatient Hospital Stay (HOSPITAL_COMMUNITY): Payer: BC Managed Care – PPO

## 2014-04-26 ENCOUNTER — Encounter (HOSPITAL_COMMUNITY)
Admission: RE | Disposition: A | Discharge status: Home / Self Care | Payer: BC Managed Care – PPO | Source: Ambulatory Visit | Attending: Thoracic Surgery (Cardiothoracic Vascular Surgery)

## 2014-04-26 ENCOUNTER — Inpatient Hospital Stay (HOSPITAL_COMMUNITY)
Admission: RE | Admit: 2014-04-26 | Discharge: 2014-05-03 | DRG: 220 | Disposition: A | Discharge status: Home / Self Care | Payer: BC Managed Care – PPO | Source: Ambulatory Visit | Attending: Thoracic Surgery (Cardiothoracic Vascular Surgery) | Admitting: Thoracic Surgery (Cardiothoracic Vascular Surgery)

## 2014-04-26 ENCOUNTER — Encounter (HOSPITAL_COMMUNITY): Payer: Self-pay | Admitting: *Deleted

## 2014-04-26 ENCOUNTER — Inpatient Hospital Stay (HOSPITAL_COMMUNITY): Payer: BC Managed Care – PPO | Admitting: Certified Registered Nurse Anesthetist

## 2014-04-26 DIAGNOSIS — D62 Acute posthemorrhagic anemia: Secondary | ICD-10-CM | POA: Diagnosis not present

## 2014-04-26 DIAGNOSIS — J40 Bronchitis, not specified as acute or chronic: Secondary | ICD-10-CM | POA: Diagnosis present

## 2014-04-26 DIAGNOSIS — M51379 Other intervertebral disc degeneration, lumbosacral region without mention of lumbar back pain or lower extremity pain: Secondary | ICD-10-CM | POA: Diagnosis present

## 2014-04-26 DIAGNOSIS — Z7982 Long term (current) use of aspirin: Secondary | ICD-10-CM

## 2014-04-26 DIAGNOSIS — M5137 Other intervertebral disc degeneration, lumbosacral region: Secondary | ICD-10-CM | POA: Diagnosis present

## 2014-04-26 DIAGNOSIS — Z9089 Acquired absence of other organs: Secondary | ICD-10-CM

## 2014-04-26 DIAGNOSIS — Z7901 Long term (current) use of anticoagulants: Secondary | ICD-10-CM

## 2014-04-26 DIAGNOSIS — Z9889 Other specified postprocedural states: Secondary | ICD-10-CM

## 2014-04-26 DIAGNOSIS — Z79899 Other long term (current) drug therapy: Secondary | ICD-10-CM

## 2014-04-26 DIAGNOSIS — D6959 Other secondary thrombocytopenia: Secondary | ICD-10-CM | POA: Diagnosis not present

## 2014-04-26 DIAGNOSIS — B1920 Unspecified viral hepatitis C without hepatic coma: Secondary | ICD-10-CM | POA: Diagnosis present

## 2014-04-26 DIAGNOSIS — I498 Other specified cardiac arrhythmias: Secondary | ICD-10-CM | POA: Diagnosis not present

## 2014-04-26 DIAGNOSIS — I4891 Unspecified atrial fibrillation: Secondary | ICD-10-CM | POA: Diagnosis not present

## 2014-04-26 DIAGNOSIS — J988 Other specified respiratory disorders: Secondary | ICD-10-CM | POA: Diagnosis not present

## 2014-04-26 DIAGNOSIS — J9819 Other pulmonary collapse: Secondary | ICD-10-CM | POA: Diagnosis not present

## 2014-04-26 DIAGNOSIS — J310 Chronic rhinitis: Secondary | ICD-10-CM | POA: Diagnosis present

## 2014-04-26 DIAGNOSIS — Y838 Other surgical procedures as the cause of abnormal reaction of the patient, or of later complication, without mention of misadventure at the time of the procedure: Secondary | ICD-10-CM | POA: Diagnosis not present

## 2014-04-26 DIAGNOSIS — E7800 Pure hypercholesterolemia, unspecified: Secondary | ICD-10-CM | POA: Diagnosis present

## 2014-04-26 DIAGNOSIS — G8929 Other chronic pain: Secondary | ICD-10-CM | POA: Diagnosis present

## 2014-04-26 DIAGNOSIS — I341 Nonrheumatic mitral (valve) prolapse: Secondary | ICD-10-CM | POA: Diagnosis present

## 2014-04-26 DIAGNOSIS — IMO0001 Reserved for inherently not codable concepts without codable children: Secondary | ICD-10-CM | POA: Diagnosis present

## 2014-04-26 DIAGNOSIS — N400 Enlarged prostate without lower urinary tract symptoms: Secondary | ICD-10-CM | POA: Diagnosis present

## 2014-04-26 DIAGNOSIS — I059 Rheumatic mitral valve disease, unspecified: Secondary | ICD-10-CM

## 2014-04-26 DIAGNOSIS — K219 Gastro-esophageal reflux disease without esophagitis: Secondary | ICD-10-CM | POA: Diagnosis present

## 2014-04-26 DIAGNOSIS — Y9210 Unspecified residential institution as the place of occurrence of the external cause: Secondary | ICD-10-CM | POA: Diagnosis not present

## 2014-04-26 DIAGNOSIS — I340 Nonrheumatic mitral (valve) insufficiency: Secondary | ICD-10-CM | POA: Diagnosis present

## 2014-04-26 HISTORY — PX: MITRAL VALVE REPAIR: SHX2039

## 2014-04-26 HISTORY — DX: Other specified postprocedural states: Z98.890

## 2014-04-26 HISTORY — PX: INTRAOPERATIVE TRANSESOPHAGEAL ECHOCARDIOGRAM: SHX5062

## 2014-04-26 LAB — POCT I-STAT 4, (NA,K, GLUC, HGB,HCT)
Glucose, Bld: 98 mg/dL (ref 70–99)
Glucose, Bld: 110 mg/dL — ABNORMAL HIGH (ref 70–99)
Glucose, Bld: 91 mg/dL (ref 70–99)
Glucose, Bld: 137 mg/dL — ABNORMAL HIGH (ref 70–99)
Glucose, Bld: 159 mg/dL — ABNORMAL HIGH (ref 70–99)
Glucose, Bld: 143 mg/dL — ABNORMAL HIGH (ref 70–99)
Glucose, Bld: 163 mg/dL — ABNORMAL HIGH (ref 70–99)
Glucose, Bld: 102 mg/dL — ABNORMAL HIGH (ref 70–99)
Glucose, Bld: 134 mg/dL — ABNORMAL HIGH (ref 70–99)
Glucose, Bld: 102 mg/dL — ABNORMAL HIGH (ref 70–99)
HCT: 33 % — ABNORMAL LOW (ref 39.0–52.0)
HCT: 32 % — ABNORMAL LOW (ref 39.0–52.0)
HCT: 27 % — ABNORMAL LOW (ref 39.0–52.0)
HCT: 25 % — ABNORMAL LOW (ref 39.0–52.0)
HCT: 26 % — ABNORMAL LOW (ref 39.0–52.0)
HCT: 22 % — ABNORMAL LOW (ref 39.0–52.0)
HCT: 26 % — ABNORMAL LOW (ref 39.0–52.0)
HCT: 24 % — ABNORMAL LOW (ref 39.0–52.0)
HCT: 26 % — ABNORMAL LOW (ref 39.0–52.0)
HCT: 29 % — ABNORMAL LOW (ref 39.0–52.0)
Hemoglobin: 11.2 g/dL — ABNORMAL LOW (ref 13.0–17.0)
Hemoglobin: 10.9 g/dL — ABNORMAL LOW (ref 13.0–17.0)
Hemoglobin: 9.2 g/dL — ABNORMAL LOW (ref 13.0–17.0)
Hemoglobin: 8.5 g/dL — ABNORMAL LOW (ref 13.0–17.0)
Hemoglobin: 8.8 g/dL — ABNORMAL LOW (ref 13.0–17.0)
Hemoglobin: 7.5 g/dL — ABNORMAL LOW (ref 13.0–17.0)
Hemoglobin: 8.8 g/dL — ABNORMAL LOW (ref 13.0–17.0)
Hemoglobin: 8.2 g/dL — ABNORMAL LOW (ref 13.0–17.0)
Hemoglobin: 8.8 g/dL — ABNORMAL LOW (ref 13.0–17.0)
Hemoglobin: 9.9 g/dL — ABNORMAL LOW (ref 13.0–17.0)
Potassium: 3.8 mEq/L (ref 3.7–5.3)
Potassium: 3.6 mEq/L — ABNORMAL LOW (ref 3.7–5.3)
Potassium: 3.7 mEq/L (ref 3.7–5.3)
Potassium: 3.5 mEq/L — ABNORMAL LOW (ref 3.7–5.3)
Potassium: 4.4 mEq/L (ref 3.7–5.3)
Potassium: 4.6 mEq/L (ref 3.7–5.3)
Potassium: 5.6 mEq/L — ABNORMAL HIGH (ref 3.7–5.3)
Potassium: 3.8 mEq/L (ref 3.7–5.3)
Potassium: 3.9 mEq/L (ref 3.7–5.3)
Potassium: 4.1 mEq/L (ref 3.7–5.3)
Sodium: 140 mEq/L (ref 137–147)
Sodium: 139 mEq/L (ref 137–147)
Sodium: 139 mEq/L (ref 137–147)
Sodium: 131 mEq/L — ABNORMAL LOW (ref 137–147)
Sodium: 132 mEq/L — ABNORMAL LOW (ref 137–147)
Sodium: 136 mEq/L — ABNORMAL LOW (ref 137–147)
Sodium: 139 mEq/L (ref 137–147)
Sodium: 142 mEq/L (ref 137–147)
Sodium: 143 mEq/L (ref 137–147)
Sodium: 141 mEq/L (ref 137–147)

## 2014-04-26 LAB — BLOOD GAS, ARTERIAL
Acid-Base Excess: 2.5 mmol/L — ABNORMAL HIGH (ref 0.0–2.0)
Bicarbonate: 27 mEq/L — ABNORMAL HIGH (ref 20.0–24.0)
Drawn by: 181601
FIO2: 0.21 %
O2 Saturation: 97.4 %
Patient temperature: 98.6
TCO2: 28.4 mmol/L (ref 0–100)
pCO2 arterial: 45.3 mmHg — ABNORMAL HIGH (ref 35.0–45.0)
pH, Arterial: 7.393 (ref 7.350–7.450)
pO2, Arterial: 85.4 mmHg (ref 80.0–100.0)

## 2014-04-26 LAB — PULMONARY FUNCTION TEST
DL/VA % pred: 143 %
DL/VA: 6.24 ml/min/mmHg/L
DLCO cor % pred: 101 %
DLCO cor: 27.26 ml/min/mmHg
DLCO unc % pred: 97 %
DLCO unc: 26.37 ml/min/mmHg
FEF 25-75 Post: 2.28 L/sec
FEF 25-75 Pre: 2.09 L/sec
FEF2575-%Change-Post: 9 %
FEF2575-%Pred-Post: 91 %
FEF2575-%Pred-Pre: 83 %
FEV1-%Change-Post: 2 %
FEV1-%Pred-Post: 80 %
FEV1-%Pred-Pre: 78 %
FEV1-Post: 2.13 L
FEV1-Pre: 2.07 L
FEV1FVC-%Change-Post: 4 %
FEV1FVC-%Pred-Pre: 106 %
FEV6-%Change-Post: -1 %
FEV6-%Pred-Post: 75 %
FEV6-%Pred-Pre: 76 %
FEV6-Post: 2.47 L
FEV6-Pre: 2.5 L
FEV6FVC-%Pred-Post: 104 %
FEV6FVC-%Pred-Pre: 104 %
FVC-%Change-Post: -1 %
FVC-%Pred-Post: 72 %
FVC-%Pred-Pre: 73 %
FVC-Post: 2.47 L
FVC-Pre: 2.5 L
Post FEV1/FVC ratio: 86 %
Post FEV6/FVC ratio: 100 %
Pre FEV1/FVC ratio: 83 %
Pre FEV6/FVC Ratio: 100 %
RV % pred: 151 %
RV: 3.12 L
TLC % pred: 174 %
TLC: 10.79 L

## 2014-04-26 LAB — POCT I-STAT 3, ART BLOOD GAS (G3+)
Acid-Base Excess: 1 mmol/L (ref 0.0–2.0)
Acid-base deficit: 3 mmol/L — ABNORMAL HIGH (ref 0.0–2.0)
Acid-base deficit: 4 mmol/L — ABNORMAL HIGH (ref 0.0–2.0)
Acid-base deficit: 5 mmol/L — ABNORMAL HIGH (ref 0.0–2.0)
Acid-base deficit: 4 mmol/L — ABNORMAL HIGH (ref 0.0–2.0)
Acid-base deficit: 2 mmol/L (ref 0.0–2.0)
Bicarbonate: 27.5 mEq/L — ABNORMAL HIGH (ref 20.0–24.0)
Bicarbonate: 23.7 mEq/L (ref 20.0–24.0)
Bicarbonate: 24.3 mEq/L — ABNORMAL HIGH (ref 20.0–24.0)
Bicarbonate: 22.8 mEq/L (ref 20.0–24.0)
Bicarbonate: 21.1 mEq/L (ref 20.0–24.0)
Bicarbonate: 23.4 mEq/L (ref 20.0–24.0)
Bicarbonate: 22 mEq/L (ref 20.0–24.0)
O2 Saturation: 100 %
O2 Saturation: 100 %
O2 Saturation: 68 %
O2 Saturation: 97 %
O2 Saturation: 95 %
O2 Saturation: 87 %
O2 Saturation: 97 %
Patient temperature: 34.6
Patient temperature: 35.2
TCO2: 29 mmol/L (ref 0–100)
TCO2: 25 mmol/L (ref 0–100)
TCO2: 25 mmol/L (ref 0–100)
TCO2: 24 mmol/L (ref 0–100)
TCO2: 22 mmol/L (ref 0–100)
TCO2: 25 mmol/L (ref 0–100)
TCO2: 23 mmol/L (ref 0–100)
pCO2 arterial: 50.9 mmHg — ABNORMAL HIGH (ref 35.0–45.0)
pCO2 arterial: 38.7 mmHg (ref 35.0–45.0)
pCO2 arterial: 47.3 mmHg — ABNORMAL HIGH (ref 35.0–45.0)
pCO2 arterial: 52.7 mmHg — ABNORMAL HIGH (ref 35.0–45.0)
pCO2 arterial: 44.2 mmHg (ref 35.0–45.0)
pCO2 arterial: 46.3 mmHg — ABNORMAL HIGH (ref 35.0–45.0)
pCO2 arterial: 31.7 mmHg — ABNORMAL LOW (ref 35.0–45.0)
pH, Arterial: 7.34 — ABNORMAL LOW (ref 7.350–7.450)
pH, Arterial: 7.307 — ABNORMAL LOW (ref 7.350–7.450)
pH, Arterial: 7.407 (ref 7.350–7.450)
pH, Arterial: 7.244 — ABNORMAL LOW (ref 7.350–7.450)
pH, Arterial: 7.287 — ABNORMAL LOW (ref 7.350–7.450)
pH, Arterial: 7.299 — ABNORMAL LOW (ref 7.350–7.450)
pH, Arterial: 7.443 (ref 7.350–7.450)
pO2, Arterial: 516 mmHg — ABNORMAL HIGH (ref 80.0–100.0)
pO2, Arterial: 385 mmHg — ABNORMAL HIGH (ref 80.0–100.0)
pO2, Arterial: 39 mmHg — CL (ref 80.0–100.0)
pO2, Arterial: 113 mmHg — ABNORMAL HIGH (ref 80.0–100.0)
pO2, Arterial: 83 mmHg (ref 80.0–100.0)
pO2, Arterial: 51 mmHg — ABNORMAL LOW (ref 80.0–100.0)
pO2, Arterial: 83 mmHg (ref 80.0–100.0)

## 2014-04-26 LAB — CBC
HCT: 29.5 % — ABNORMAL LOW (ref 39.0–52.0)
HCT: 25.8 % — ABNORMAL LOW (ref 39.0–52.0)
Hemoglobin: 9.7 g/dL — ABNORMAL LOW (ref 13.0–17.0)
Hemoglobin: 8.7 g/dL — ABNORMAL LOW (ref 13.0–17.0)
MCH: 30.6 pg (ref 26.0–34.0)
MCH: 31.1 pg (ref 26.0–34.0)
MCHC: 32.9 g/dL (ref 30.0–36.0)
MCHC: 33.7 g/dL (ref 30.0–36.0)
MCV: 93.1 fL (ref 78.0–100.0)
MCV: 92.1 fL (ref 78.0–100.0)
Platelets: 92 10*3/uL — ABNORMAL LOW (ref 150–400)
Platelets: 67 10*3/uL — ABNORMAL LOW (ref 150–400)
RBC: 3.17 MIL/uL — ABNORMAL LOW (ref 4.22–5.81)
RBC: 2.8 MIL/uL — ABNORMAL LOW (ref 4.22–5.81)
RDW: 12.2 % (ref 11.5–15.5)
RDW: 12.1 % (ref 11.5–15.5)
WBC: 7.3 10*3/uL (ref 4.0–10.5)
WBC: 5.9 10*3/uL (ref 4.0–10.5)

## 2014-04-26 LAB — PROTIME-INR
INR: 1.69 — ABNORMAL HIGH (ref 0.00–1.49)
Prothrombin Time: 19.4 seconds — ABNORMAL HIGH (ref 11.6–15.2)

## 2014-04-26 LAB — MAGNESIUM: Magnesium: 2.9 mg/dL — ABNORMAL HIGH (ref 1.5–2.5)

## 2014-04-26 LAB — POCT I-STAT, CHEM 8
BUN: 4 mg/dL — ABNORMAL LOW (ref 6–23)
Calcium, Ion: 1.15 mmol/L (ref 1.13–1.30)
Chloride: 112 mEq/L (ref 96–112)
Creatinine, Ser: 0.8 mg/dL (ref 0.50–1.35)
Glucose, Bld: 127 mg/dL — ABNORMAL HIGH (ref 70–99)
HCT: 24 % — ABNORMAL LOW (ref 39.0–52.0)
Hemoglobin: 8.2 g/dL — ABNORMAL LOW (ref 13.0–17.0)
Potassium: 4.4 mEq/L (ref 3.7–5.3)
Sodium: 139 mEq/L (ref 137–147)
TCO2: 16 mmol/L (ref 0–100)

## 2014-04-26 LAB — GLUCOSE, CAPILLARY: Glucose-Capillary: 100 mg/dL — ABNORMAL HIGH (ref 70–99)

## 2014-04-26 LAB — PREPARE RBC (CROSSMATCH)

## 2014-04-26 LAB — CREATININE, SERUM
Creatinine, Ser: 0.79 mg/dL (ref 0.50–1.35)
GFR calc Af Amer: >90 mL/min (ref >90)
GFR calc non Af Amer: >90 mL/min (ref >90)

## 2014-04-26 LAB — PLATELET COUNT: Platelets: 105 10*3/uL — ABNORMAL LOW (ref 150–400)

## 2014-04-26 LAB — POCT I-STAT GLUCOSE
Glucose, Bld: 118 mg/dL — ABNORMAL HIGH (ref 70–99)
Operator id: 173792

## 2014-04-26 LAB — HEMOGLOBIN AND HEMATOCRIT, BLOOD
HCT: 22.8 % — ABNORMAL LOW (ref 39.0–52.0)
Hemoglobin: 7.7 g/dL — ABNORMAL LOW (ref 13.0–17.0)

## 2014-04-26 LAB — APTT: aPTT: 37 seconds (ref 24–37)

## 2014-04-26 SURGERY — REPAIR, MITRAL VALVE, MINIMALLY INVASIVE
Anesthesia: General | Site: Chest | Laterality: Right

## 2014-04-26 MED ORDER — ROCURONIUM BROMIDE 50 MG/5ML IV SOLN
INTRAVENOUS | Status: AC
  Filled 2014-04-26: qty 2

## 2014-04-26 MED ORDER — ACETAMINOPHEN 500 MG PO TABS
1000.0000 mg | ORAL_TABLET | Freq: Four times a day (QID) | ORAL | Status: AC
  Administered 2014-04-27 – 2014-05-01 (×18): 1000 mg via ORAL
  Filled 2014-04-26 (×19): qty 2

## 2014-04-26 MED ORDER — LACTATED RINGERS IV SOLN
INTRAVENOUS | Status: DC | PRN
  Administered 2014-04-26 (×3): via INTRAVENOUS

## 2014-04-26 MED ORDER — BISACODYL 5 MG PO TBEC
10.0000 mg | DELAYED_RELEASE_TABLET | Freq: Every day | ORAL | Status: DC
  Administered 2014-04-27 – 2014-05-01 (×5): 10 mg via ORAL
  Filled 2014-04-26 (×5): qty 2

## 2014-04-26 MED ORDER — DEXTROSE 5 % IV SOLN
1.5000 g | Freq: Two times a day (BID) | INTRAVENOUS | Status: AC
  Administered 2014-04-26 – 2014-04-28 (×4): 1.5 g via INTRAVENOUS
  Filled 2014-04-26 (×4): qty 1.5

## 2014-04-26 MED ORDER — MAGNESIUM SULFATE 4000MG/100ML IJ SOLN
4.0000 g | Freq: Once | INTRAMUSCULAR | Status: AC
  Administered 2014-04-26: 4 g via INTRAVENOUS
  Filled 2014-04-26: qty 100

## 2014-04-26 MED ORDER — ACETAMINOPHEN 650 MG RE SUPP
650.0000 mg | Freq: Once | RECTAL | Status: AC
  Administered 2014-04-26: 650 mg via RECTAL

## 2014-04-26 MED ORDER — NITROGLYCERIN IN D5W 200-5 MCG/ML-% IV SOLN: 0.0000 ug/min | INTRAVENOUS | Status: DC

## 2014-04-26 MED ORDER — SODIUM CHLORIDE 0.9 % IJ SOLN: 3.0000 mL | INTRAMUSCULAR | Status: DC | PRN

## 2014-04-26 MED ORDER — FAMOTIDINE IN NACL 20-0.9 MG/50ML-% IV SOLN
20.0000 mg | Freq: Two times a day (BID) | INTRAVENOUS | Status: AC
  Administered 2014-04-26 (×2): 20 mg via INTRAVENOUS
  Filled 2014-04-26: qty 50

## 2014-04-26 MED ORDER — LIDOCAINE HCL (CARDIAC) 20 MG/ML IV SOLN
INTRAVENOUS | Status: AC
  Filled 2014-04-26: qty 5

## 2014-04-26 MED ORDER — SODIUM CHLORIDE 0.9 % IV SOLN
INTRAVENOUS | Status: DC
  Filled 2014-04-26: qty 1

## 2014-04-26 MED ORDER — ROCURONIUM BROMIDE 100 MG/10ML IV SOLN
INTRAVENOUS | Status: DC | PRN
  Administered 2014-04-26: 20 mg via INTRAVENOUS
  Administered 2014-04-26: 50 mg via INTRAVENOUS
  Administered 2014-04-26: 80 mg via INTRAVENOUS
  Administered 2014-04-26: 20 mg via INTRAVENOUS
  Administered 2014-04-26: 30 mg via INTRAVENOUS

## 2014-04-26 MED ORDER — POTASSIUM CHLORIDE 10 MEQ/50ML IV SOLN: 10.0000 meq | INTRAVENOUS | Status: AC

## 2014-04-26 MED ORDER — HEMOSTATIC AGENTS (NO CHARGE) OPTIME
TOPICAL | Status: DC | PRN
  Administered 2014-04-26: 1 via TOPICAL

## 2014-04-26 MED ORDER — FENTANYL CITRATE 0.05 MG/ML IJ SOLN
INTRAMUSCULAR | Status: AC
  Filled 2014-04-26: qty 5

## 2014-04-26 MED ORDER — BISACODYL 10 MG RE SUPP
10.0000 mg | Freq: Every day | RECTAL | Status: DC
  Filled 2014-04-26: qty 1

## 2014-04-26 MED ORDER — SODIUM CHLORIDE 0.9 % IR SOLN
Status: DC | PRN
  Administered 2014-04-26: 3000 mL

## 2014-04-26 MED ORDER — SODIUM CHLORIDE 0.9 % IJ SOLN
INTRAMUSCULAR | Status: AC
  Filled 2014-04-26: qty 10

## 2014-04-26 MED ORDER — MIDAZOLAM HCL 5 MG/5ML IJ SOLN
INTRAMUSCULAR | Status: DC | PRN
  Administered 2014-04-26: 3 mg via INTRAVENOUS
  Administered 2014-04-26: 2 mg via INTRAVENOUS
  Administered 2014-04-26: 1 mg via INTRAVENOUS
  Administered 2014-04-26 (×2): 2 mg via INTRAVENOUS
  Administered 2014-04-26: 3 mg via INTRAVENOUS
  Administered 2014-04-26: 2 mg via INTRAVENOUS

## 2014-04-26 MED ORDER — ALBUMIN HUMAN 5 % IV SOLN
250.0000 mL | INTRAVENOUS | Status: AC | PRN
  Administered 2014-04-26 (×3): 250 mL via INTRAVENOUS
  Filled 2014-04-26: qty 250

## 2014-04-26 MED ORDER — METOPROLOL TARTRATE 1 MG/ML IV SOLN: 2.5000 mg | INTRAVENOUS | Status: DC | PRN

## 2014-04-26 MED ORDER — CALCIUM CHLORIDE 10 % IV SOLN
INTRAVENOUS | Status: DC | PRN
  Administered 2014-04-26: 200 mg via INTRAVENOUS

## 2014-04-26 MED ORDER — DEXMEDETOMIDINE HCL IN NACL 200 MCG/50ML IV SOLN
0.1000 ug/kg/h | INTRAVENOUS | Status: DC
  Administered 2014-04-26: 0.7 ug/kg/h via INTRAVENOUS
  Filled 2014-04-26 (×2): qty 50

## 2014-04-26 MED ORDER — EPHEDRINE SULFATE 50 MG/ML IJ SOLN
INTRAMUSCULAR | Status: AC
  Filled 2014-04-26: qty 1

## 2014-04-26 MED ORDER — PHENYLEPHRINE 40 MCG/ML (10ML) SYRINGE FOR IV PUSH (FOR BLOOD PRESSURE SUPPORT)
PREFILLED_SYRINGE | INTRAVENOUS | Status: AC
  Filled 2014-04-26: qty 10

## 2014-04-26 MED ORDER — DOCUSATE SODIUM 100 MG PO CAPS
200.0000 mg | ORAL_CAPSULE | Freq: Every day | ORAL | Status: DC
  Administered 2014-04-27 – 2014-05-02 (×6): 200 mg via ORAL
  Filled 2014-04-26 (×7): qty 2

## 2014-04-26 MED ORDER — ASPIRIN EC 325 MG PO TBEC
325.0000 mg | DELAYED_RELEASE_TABLET | Freq: Every day | ORAL | Status: DC
  Administered 2014-04-27: 325 mg via ORAL
  Filled 2014-04-26: qty 1

## 2014-04-26 MED ORDER — SODIUM CHLORIDE 0.9 % IV SOLN: INTRAVENOUS | Status: DC

## 2014-04-26 MED ORDER — METOPROLOL TARTRATE 12.5 MG HALF TABLET
12.5000 mg | ORAL_TABLET | Freq: Two times a day (BID) | ORAL | Status: DC
  Filled 2014-04-26 (×3): qty 1

## 2014-04-26 MED ORDER — ONDANSETRON HCL 4 MG/2ML IJ SOLN
4.0000 mg | Freq: Four times a day (QID) | INTRAMUSCULAR | Status: DC | PRN
  Administered 2014-04-27: 4 mg via INTRAVENOUS
  Filled 2014-04-26: qty 2

## 2014-04-26 MED ORDER — INSULIN REGULAR BOLUS VIA INFUSION
0.0000 [IU] | Freq: Three times a day (TID) | INTRAVENOUS | Status: DC
  Administered 2014-04-26: 0.5 [IU] via INTRAVENOUS
  Administered 2014-04-26: 1.7 [IU] via INTRAVENOUS
  Administered 2014-04-26: 0.5 [IU] via INTRAVENOUS
  Filled 2014-04-26: qty 10

## 2014-04-26 MED ORDER — MORPHINE SULFATE 2 MG/ML IJ SOLN
2.0000 mg | INTRAMUSCULAR | Status: DC | PRN
  Administered 2014-04-27: 4 mg via INTRAVENOUS
  Filled 2014-04-26: qty 2

## 2014-04-26 MED ORDER — PROPOFOL 10 MG/ML IV BOLUS
INTRAVENOUS | Status: AC
  Filled 2014-04-26: qty 20

## 2014-04-26 MED ORDER — PROPOFOL 10 MG/ML IV BOLUS
INTRAVENOUS | Status: DC | PRN
  Administered 2014-04-26: 50 mg via INTRAVENOUS

## 2014-04-26 MED ORDER — PANTOPRAZOLE SODIUM 40 MG PO TBEC
40.0000 mg | DELAYED_RELEASE_TABLET | Freq: Every day | ORAL | Status: DC
  Administered 2014-04-28 – 2014-05-01 (×4): 40 mg via ORAL
  Filled 2014-04-26 (×5): qty 1

## 2014-04-26 MED ORDER — SODIUM CHLORIDE 0.9 % IV SOLN
250.0000 mL | INTRAVENOUS | Status: AC
Start: 2014-04-26 — End: 2014-04-27

## 2014-04-26 MED ORDER — HEPARIN SODIUM (PORCINE) 1000 UNIT/ML IJ SOLN
INTRAMUSCULAR | Status: DC | PRN
  Administered 2014-04-26: 22000 [IU] via INTRAVENOUS

## 2014-04-26 MED ORDER — SODIUM CHLORIDE 0.45 % IV SOLN: INTRAVENOUS | Status: DC

## 2014-04-26 MED ORDER — METOPROLOL TARTRATE 25 MG/10 ML ORAL SUSPENSION
12.5000 mg | Freq: Two times a day (BID) | ORAL | Status: DC
  Filled 2014-04-26 (×3): qty 5

## 2014-04-26 MED ORDER — PHENYLEPHRINE HCL 10 MG/ML IJ SOLN
0.0000 ug/min | INTRAVENOUS | Status: DC
  Filled 2014-04-26: qty 2

## 2014-04-26 MED ORDER — HEPARIN SODIUM (PORCINE) 1000 UNIT/ML IJ SOLN
INTRAMUSCULAR | Status: AC
  Filled 2014-04-26: qty 1

## 2014-04-26 MED ORDER — LACTATED RINGERS IV SOLN: 500.0000 mL | Freq: Once | INTRAVENOUS | Status: AC | PRN

## 2014-04-26 MED ORDER — FENTANYL CITRATE 0.05 MG/ML IJ SOLN
INTRAMUSCULAR | Status: DC | PRN
  Administered 2014-04-26: 250 ug via INTRAVENOUS
  Administered 2014-04-26: 200 ug via INTRAVENOUS
  Administered 2014-04-26 (×2): 250 ug via INTRAVENOUS
  Administered 2014-04-26: 50 ug via INTRAVENOUS
  Administered 2014-04-26: 100 ug via INTRAVENOUS
  Administered 2014-04-26: 150 ug via INTRAVENOUS
  Administered 2014-04-26: 250 ug via INTRAVENOUS

## 2014-04-26 MED ORDER — SODIUM CHLORIDE 0.9 % IJ SOLN
3.0000 mL | Freq: Two times a day (BID) | INTRAMUSCULAR | Status: DC
  Administered 2014-04-27 – 2014-04-28 (×3): 3 mL via INTRAVENOUS

## 2014-04-26 MED ORDER — MIDAZOLAM HCL 10 MG/2ML IJ SOLN
INTRAMUSCULAR | Status: AC
  Filled 2014-04-26: qty 2

## 2014-04-26 MED ORDER — VANCOMYCIN HCL IN DEXTROSE 1-5 GM/200ML-% IV SOLN
1000.0000 mg | Freq: Once | INTRAVENOUS | Status: AC
  Administered 2014-04-27: 1000 mg via INTRAVENOUS
  Filled 2014-04-26: qty 200

## 2014-04-26 MED ORDER — ALBUMIN HUMAN 5 % IV SOLN
INTRAVENOUS | Status: DC | PRN
  Administered 2014-04-26: 15:00:00 via INTRAVENOUS

## 2014-04-26 MED ORDER — OXYCODONE HCL 5 MG PO TABS
5.0000 mg | ORAL_TABLET | ORAL | Status: DC | PRN
  Administered 2014-04-27 – 2014-04-28 (×5): 10 mg via ORAL
  Administered 2014-05-02: 5 mg via ORAL
  Filled 2014-04-26 (×3): qty 2
  Filled 2014-04-26: qty 1
  Filled 2014-04-26 (×2): qty 2

## 2014-04-26 MED ORDER — ACETAMINOPHEN 160 MG/5ML PO SOLN
1000.0000 mg | Freq: Four times a day (QID) | ORAL | Status: DC
  Administered 2014-04-27: 1000 mg

## 2014-04-26 MED ORDER — PROTAMINE SULFATE 10 MG/ML IV SOLN
INTRAVENOUS | Status: DC | PRN
  Administered 2014-04-26: 300 mg via INTRAVENOUS

## 2014-04-26 MED ORDER — BUPIVACAINE HCL 0.5 % IJ SOLN
INTRAMUSCULAR | Status: AC
  Filled 2014-04-26: qty 1

## 2014-04-26 MED ORDER — PROTAMINE SULFATE 10 MG/ML IV SOLN
INTRAVENOUS | Status: AC
  Filled 2014-04-26: qty 25

## 2014-04-26 MED ORDER — BUPIVACAINE HCL 0.5 % IJ SOLN
INTRAMUSCULAR | Status: DC | PRN
  Administered 2014-04-26: 10 mL

## 2014-04-26 MED ORDER — ASPIRIN 81 MG PO CHEW: 324.0000 mg | CHEWABLE_TABLET | Freq: Every day | ORAL | Status: DC

## 2014-04-26 MED ORDER — ACETAMINOPHEN 160 MG/5ML PO SOLN: 650.0000 mg | Freq: Once | ORAL | Status: AC

## 2014-04-26 MED ORDER — MIDAZOLAM HCL 2 MG/2ML IJ SOLN
2.0000 mg | INTRAMUSCULAR | Status: DC | PRN
  Administered 2014-04-27: 2 mg via INTRAVENOUS
  Filled 2014-04-26: qty 2

## 2014-04-26 MED ORDER — MORPHINE SULFATE 2 MG/ML IJ SOLN: 1.0000 mg | INTRAMUSCULAR | Status: AC | PRN

## 2014-04-26 MED ORDER — SODIUM CHLORIDE 0.9 % IV SOLN
1.0000 g/h | Freq: Once | INTRAVENOUS | Status: DC
  Filled 2014-04-26: qty 20

## 2014-04-26 MED ORDER — LACTATED RINGERS IV SOLN
INTRAVENOUS | Status: DC
  Administered 2014-04-27: 06:00:00 via INTRAVENOUS

## 2014-04-26 SURGICAL SUPPLY — 95 items
ADAPTER CARDIO PERF ANTE/RETRO (ADAPTER) ×3 IMPLANT
BAG DECANTER FOR FLEXI CONT (MISCELLANEOUS) ×6 IMPLANT
BENZOIN TINCTURE PRP APPL 2/3 (GAUZE/BANDAGES/DRESSINGS) ×3 IMPLANT
BLADE STERNUM SYSTEM 6 (BLADE) ×3 IMPLANT
BLADE SURG 11 STRL SS (BLADE) ×3 IMPLANT
CANISTER SUCTION 2500CC (MISCELLANEOUS) ×3 IMPLANT
CANNULA FEM VENOUS REMOTE 22FR (CANNULA) ×3 IMPLANT
CANNULA FEMORAL ART 14 SM (MISCELLANEOUS) ×3 IMPLANT
CANNULA GUNDRY RCSP 15FR (MISCELLANEOUS) ×3 IMPLANT
CANNULA OPTISITE PERFUSION 18F (CANNULA) ×3 IMPLANT
CARDIAC SUCTION (MISCELLANEOUS) ×3 IMPLANT
CONN ST 1/4X3/8  BEN (MISCELLANEOUS) ×2
CONN ST 1/4X3/8 BEN (MISCELLANEOUS) ×4 IMPLANT
CONT SPEC 4OZ CLIKSEAL STRL BL (MISCELLANEOUS) ×3 IMPLANT
CONT SPEC STER OR (MISCELLANEOUS) ×6 IMPLANT
COVER BACK TABLE 24X17X13 BIG (DRAPES) ×3 IMPLANT
COVER PROBE W GEL 5X96 (DRAPES) ×3 IMPLANT
COVER SURGICAL LIGHT HANDLE (MISCELLANEOUS) ×3 IMPLANT
CRADLE DONUT ADULT HEAD (MISCELLANEOUS) ×3 IMPLANT
DERMABOND ADVANCED (GAUZE/BANDAGES/DRESSINGS) ×2
DERMABOND ADVANCED .7 DNX12 (GAUZE/BANDAGES/DRESSINGS) ×4 IMPLANT
DEVICE PMI PUNCTURE CLOSURE (MISCELLANEOUS) ×3 IMPLANT
DEVICE SUT CK QUICK LOAD INDV (Prosthesis & Implant Heart) ×15 IMPLANT
DEVICE SUT CK QUICK LOAD MINI (Prosthesis & Implant Heart) ×9 IMPLANT
DEVICE TROCAR PUNCTURE CLOSURE (ENDOMECHANICALS) ×3 IMPLANT
DRAIN CHANNEL 28F RND 3/8 FF (WOUND CARE) ×6 IMPLANT
DRAPE BILATERAL SPLIT (DRAPES) ×3 IMPLANT
DRAPE C-ARM 42X72 X-RAY (DRAPES) ×3 IMPLANT
DRAPE CV SPLIT W-CLR ANES SCRN (DRAPES) ×3 IMPLANT
DRAPE INCISE IOBAN 66X45 STRL (DRAPES) ×6 IMPLANT
DRAPE SLUSH/WARMER DISC (DRAPES) ×3 IMPLANT
DRSG COVADERM 4X8 (GAUZE/BANDAGES/DRESSINGS) ×3 IMPLANT
ELECT BLADE 6.5 EXT (BLADE) ×3 IMPLANT
ELECT REM PT RETURN 9FT ADLT (ELECTROSURGICAL) ×6
ELECTRODE REM PT RTRN 9FT ADLT (ELECTROSURGICAL) ×4 IMPLANT
GLOVE ORTHO TXT STRL SZ7.5 (GLOVE) ×9 IMPLANT
GOWN STRL REUS W/ TWL LRG LVL3 (GOWN DISPOSABLE) ×8 IMPLANT
GOWN STRL REUS W/TWL LRG LVL3 (GOWN DISPOSABLE) ×4
IV NS 1000ML (IV SOLUTION) ×2
IV NS 1000ML BAXH (IV SOLUTION) ×4 IMPLANT
KIT BASIN OR (CUSTOM PROCEDURE TRAY) ×3 IMPLANT
KIT DEVICE SUT COR-KNOT MIS 5 (INSTRUMENTS) ×3 IMPLANT
KIT DILATOR VASC 18G NDL (KITS) ×3 IMPLANT
KIT DRAINAGE VACCUM ASSIST (KITS) ×3 IMPLANT
KIT ROOM TURNOVER OR (KITS) ×3 IMPLANT
KIT SUCTION CATH 14FR (SUCTIONS) ×3 IMPLANT
LEAD PACING MYOCARDI (MISCELLANEOUS) ×3 IMPLANT
LINE VENT (MISCELLANEOUS) ×3 IMPLANT
NEEDLE AORTIC ROOT 14G 7F (CATHETERS) ×3 IMPLANT
NEEDLE HYPO 25GX1X1/2 BEV (NEEDLE) ×3 IMPLANT
NS IRRIG 1000ML POUR BTL (IV SOLUTION) ×15 IMPLANT
PACK OPEN HEART (CUSTOM PROCEDURE TRAY) ×3 IMPLANT
PAD ARMBOARD 7.5X6 YLW CONV (MISCELLANEOUS) ×6 IMPLANT
PAD ELECT DEFIB RADIOL ZOLL (MISCELLANEOUS) ×3 IMPLANT
PATCH CORMATRIX 4CMX7CM (Prosthesis & Implant Heart) ×3 IMPLANT
RETRACTOR PVM SOFT TISSUE M (INSTRUMENTS) ×3 IMPLANT
RETRACTOR TRL SOFT TISSUE LG (INSTRUMENTS) IMPLANT
RETRACTOR TRM SOFT TISSUE 7.5 (INSTRUMENTS) IMPLANT
RING MITRAL MEMO 3D 36MM SMD36 (Prosthesis & Implant Heart) ×3 IMPLANT
SET CANNULATION TOURNIQUET (MISCELLANEOUS) ×3 IMPLANT
SET CARDIOPLEGIA MPS 5001102 (MISCELLANEOUS) ×3 IMPLANT
SET IRRIG TUBING LAPAROSCOPIC (IRRIGATION / IRRIGATOR) ×3 IMPLANT
SOLUTION ANTI FOG 6CC (MISCELLANEOUS) ×3 IMPLANT
SPONGE GAUZE 4X4 12PLY (GAUZE/BANDAGES/DRESSINGS) ×3 IMPLANT
STRIP PERIGUARD 6X8 (Vascular Products) ×3 IMPLANT
SUCKER WEIGHTED FLEX (MISCELLANEOUS) ×6 IMPLANT
SUT BONE WAX W31G (SUTURE) ×3 IMPLANT
SUT E-PACK MINIMALLY INVASIVE (SUTURE) ×3 IMPLANT
SUT ETHIBOND (SUTURE) ×6 IMPLANT
SUT ETHIBOND 2-0 RB-1 WHT (SUTURE) ×3 IMPLANT
SUT ETHIBOND X763 2 0 SH 1 (SUTURE) ×3 IMPLANT
SUT GORETEX CV 4 TH 22 36 (SUTURE) ×3 IMPLANT
SUT GORETEX CV4 TH-18 (SUTURE) ×27 IMPLANT
SUT MNCRL AB 3-0 PS2 18 (SUTURE) IMPLANT
SUT PROLENE 3 0 SH1 36 (SUTURE) ×12 IMPLANT
SUT PROLENE 4 0 RB 1 (SUTURE) ×14
SUT PROLENE 4 0 SH DA (SUTURE) ×12 IMPLANT
SUT PROLENE 4-0 RB1 .5 CRCL 36 (SUTURE) ×28 IMPLANT
SUT SILK 2 0 SH CR/8 (SUTURE) IMPLANT
SUT SILK 3 0 SH CR/8 (SUTURE) ×3 IMPLANT
SUT VIC AB 2-0 CTX 36 (SUTURE) IMPLANT
SUT VIC AB 3-0 SH 8-18 (SUTURE) IMPLANT
SUT VICRYL 2 TP 1 (SUTURE) IMPLANT
SWAB COLLECTION DEVICE MRSA (MISCELLANEOUS) ×3 IMPLANT
SYRINGE 10CC LL (SYRINGE) ×3 IMPLANT
SYSTEM SAHARA CHEST DRAIN ATS (WOUND CARE) ×3 IMPLANT
TOWEL OR 17X24 6PK STRL BLUE (TOWEL DISPOSABLE) ×6 IMPLANT
TOWEL OR 17X26 10 PK STRL BLUE (TOWEL DISPOSABLE) ×6 IMPLANT
TRAY FOLEY IC TEMP SENS 16FR (CATHETERS) ×3 IMPLANT
TROCAR XCEL BLADELESS 5X75MML (TROCAR) ×3 IMPLANT
TROCAR XCEL NON-BLD 11X100MML (ENDOMECHANICALS) ×6 IMPLANT
TUNNELER SHEATH ON-Q 11GX8 DSP (PAIN MANAGEMENT) IMPLANT
UNDERPAD 30X30 INCONTINENT (UNDERPADS AND DIAPERS) ×3 IMPLANT
WATER STERILE IRR 1000ML POUR (IV SOLUTION) ×6 IMPLANT
WIRE BENTSON .035X145CM (WIRE) ×3 IMPLANT

## 2014-04-26 NOTE — Anesthesia Procedure Notes (Addendum)
Procedure Name: Intubation Date/Time: 04/26/2014 7:45 AM Performed by: Coralee Rud Pre-anesthesia Checklist: Patient identified, Emergency Drugs available, Suction available and Patient being monitored Patient Re-evaluated:Patient Re-evaluated prior to inductionOxygen Delivery Method: Circle system utilized Preoxygenation: Pre-oxygenation with 100% oxygen Intubation Type: IV induction Ventilation: Mask ventilation without difficulty Laryngoscope Size: Miller and 3 Grade View: Grade I Tube type: Oral Endobronchial tube: Left and 39 Fr Laser Tube: Cuffed inflated with minimal occlusive pressure - saline Number of attempts: 1 Placement Confirmation: ETT inserted through vocal cords under direct vision,  positive ETCO2 and breath sounds checked- equal and bilateral Secured at: 31 cm Tube secured with: Tape Dental Injury: Teeth and Oropharynx as per pre-operative assessment

## 2014-04-26 NOTE — Progress Notes (Signed)
TCTS BRIEF SICU PROGRESS NOTE  Day of Surgery  S/P Procedure(s) (LRB): MINIMALLY INVASIVE MITRAL VALVE REPAIR (MVR) (Right) INTRAOPERATIVE TRANSESOPHAGEAL ECHOCARDIOGRAM (N/A)   Sedated on vent Junctional brady under pacer, AAI pacing Stable hemodynamics although PA pressures and cardiac output low Minimal chest tube output Voluminous urine output Labs okay  Plan: Needs volume.  Continue routine early postop  Purcell Nails 04/26/2014 7:42 PM

## 2014-04-26 NOTE — Anesthesia Preprocedure Evaluation (Signed)
Anesthesia Evaluation  Patient identified by MRN, date of birth, ID band Patient awake    Reviewed: Allergy & Precautions, H&P , NPO status , Patient's Chart, lab work & pertinent test results, reviewed documented beta blocker date and time   History of Anesthesia Complications Negative for: history of anesthetic complications  Airway Mallampati: II      Dental  (+) Teeth Intact   Pulmonary neg pulmonary ROS,    Pulmonary exam normal       Cardiovascular + Peripheral Vascular Disease Rhythm:Regular Rate:Bradycardia     Neuro/Psych Fibromyalgia  Neuromuscular disease negative neurological ROS  negative psych ROS   GI/Hepatic GERD-  Medicated and Controlled,(+) Hepatitis -, C  Endo/Other  negative endocrine ROS  Renal/GU negative Renal ROS     Musculoskeletal  (+) Fibromyalgia -  Abdominal Normal abdominal exam  (+)   Peds  Hematology negative hematology ROS (+)   Anesthesia Other Findings   Reproductive/Obstetrics                           Anesthesia Physical Anesthesia Plan  ASA: III  Anesthesia Plan: General   Post-op Pain Management:    Induction: Intravenous  Airway Management Planned: Double Lumen EBT and Oral ETT  Additional Equipment: Arterial line, CVP, PA Cath, 3D TEE and Ultrasound Guidance Line Placement  Intra-op Plan: Utilization of Controlled Hypotension per surrgeon request and Utilization Of Total Body Hypothermia per surgeon request  Post-operative Plan: Post-operative intubation/ventilation  Informed Consent: I have reviewed the patients History and Physical, chart, labs and discussed the procedure including the risks, benefits and alternatives for the proposed anesthesia with the patient or authorized representative who has indicated his/her understanding and acceptance.   Dental advisory given  Plan Discussed with: CRNA, Anesthesiologist and  Surgeon  Anesthesia Plan Comments:         Anesthesia Quick Evaluation

## 2014-04-26 NOTE — Transfer of Care (Signed)
Immediate Anesthesia Transfer of Care Note  Patient: Bradley Hansen  Procedure(s) Performed: Procedure(s): MINIMALLY INVASIVE MITRAL VALVE REPAIR (MVR) (Right) INTRAOPERATIVE TRANSESOPHAGEAL ECHOCARDIOGRAM (N/A)  Patient Location: SICU  Anesthesia Type:General  Level of Consciousness: Patient remains intubated per anesthesia plan  Airway & Oxygen Therapy: Patient remains intubated per anesthesia plan and Patient placed on Ventilator (see vital sign flow sheet for setting)  Post-op Assessment: Report given to PACU RN and Post -op Vital signs reviewed and stable  Post vital signs: Reviewed and stable  Complications: No apparent anesthesia complications

## 2014-04-26 NOTE — Op Note (Addendum)
CARDIOTHORACIC SURGERY OPERATIVE NOTE  Date of Procedure:  04/26/2014  Preoperative Diagnosis: Severe Mitral Regurgitation  Postoperative Diagnosis: Same  Procedure:    Minimally-Invasive Mitral Valve Repair  Complex valvuloplasty including artificial Gore-tex neo-cord placement x8  Decalcification and bovine pericardial patch resuspension (augmentation) of P3 portion of posterior leaflet  Sorin Memo 3D Ring Annuloplasty (size 36mm, catalog # [id number], serial # [id number])    Surgeon: Salvatore Decent. Cornelius Moras, MD  Assistant: Rowe Clack, PA-C  Anesthesia: Corky Sox, MD  Operative Findings: Barlow's disease with bileaflet prolapse, large redundant leaflets with moderate thickening and fibrosis, and severe calcification of P3 portion of posterior leaflet, posterior annulus and sub-valvular apparatus Type II dysfunction with severe (4+) mitral regurgitation  Normal LV systolic function  Moderate LV chamber dilatation  No residual mitral regurgitation after successful valve repair                    BRIEF CLINICAL NOTE AND INDICATIONS FOR SURGERY  Patient is a 63 year old African American male originally from the [place] who is a naturalized [place] citizen and has been living in [place] for several years where 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 Several years ago he was noted to have a heart murmur on routine physical examination, and he has been followed intermittently by Dr. Gala Romney, Dr. Antoine Poche, and more recently by Dr. Eden Emms. Echocardiograms have demonstrated bileaflet prolapse of the mitral valve with moderate to severe mitral regurgitation. I saw him in consultation on 03/09/2011 (prior to [facility] - note in [place]). At that time he remained entirely asymptomatic and he was able to demonstrate normal exercise tolerance on exercise stress echocardiogram. He was disinclined to consider surgery at that time and plans were made for continued medical therapy with  close followup. I have not personally seen him since then, but he has been followed regularly with intermittent echocardiograms. He was seen recently by Dr. Eden Emms and noted to have developed exertional shortness of breath. The patient has been seen in consultation and counseled at length regarding the indications, risks and potential benefits of surgery.  All questions have been answered, and the patient provides full informed consent for the operation as described.     DETAILS OF THE OPERATIVE PROCEDURE  Preparation:  The patient is brought to the operating room on the above mentioned date and central monitoring was established by the anesthesia team including placement of Swan-Ganz catheter through the left internal jugular vein.  A radial arterial line is placed. The patient is placed in the supine position on the operating table.  Intravenous antibiotics are administered. General endotracheal anesthesia is induced uneventfully. The patient is initially intubated using a dual lumen endotracheal tube.  A Foley catheter is placed.  Baseline transesophageal echocardiogram was performed.  Findings were notable for classical Barlow's disease with severe billowing and bileaflet prolapse of the mitral valve with severe (4+) mitral regurgitation.  There was mild LV chamber enlargement and normal LV systolic function.  There was trivial tricuspid regurgitation and normal tricuspid annulus diameter.  No other abnormalities were noted.  A soft roll is placed behind the patient's left scapula and the neck gently extended and turned to the left.   The patient's right neck, chest, abdomen, both groins, and both lower extremities are prepared and draped in a sterile manner.  A time out procedure is performed.   Surgical Approach:  A right miniature anterolateral thoracotomy incision is performed. The incision is placed just lateral to and superior  to the right nipple. The pectoralis major muscle is retracted  medially and completely preserved. The right pleural space is entered through the 3rd intercostal space. A soft tissue retractor is placed.  Two 11 mm ports are placed through separate stab incisions inferiorly. The right pleural space is insufflated continuously with carbon dioxide gas through the posterior port during the remainder of the operation.  A pledgeted sutures placed through the dome of the right hemidiaphragm and retracted inferiorly to facilitate exposure.  A longitudinal incision is made in the pericardium 3 cm anterior to the phrenic nerve and silk traction sutures are placed on either side of the incision for exposure.   Extracorporeal Cardiopulmonary Bypass and Myocardial Protection:  A small incision is made in the right inguinal crease and the anterior surface of the right common femoral artery and right common femoral vein are identified.  The patient is placed in Trendelenburg position. The right internal jugular vein is cannulated with Seldinger technique and a guidewire advanced into the right atrium. The patient is heparinized systemically. The right internal jugular vein is cannulated with a 14 [place] pediatric femoral venous cannula. Pursestring sutures are placed on the anterior surface of the right common femoral vein and right common femoral artery. The right common femoral vein is cannulated with the Seldinger technique and a guidewire is advanced under transesophageal echocardiogram guidance through the right atrium. The femoral vein is cannulated with a long 22 French femoral venous cannula. The right common femoral artery is cannulated with Seldinger technique and a flexible guidewire is advanced until it can be appreciated intraluminally in the descending thoracic aorta on transesophageal echocardiogram. The femoral artery is cannulated with an 18 French femoral arterial cannula.  Adequate heparinization is verified.     The entire pre-bypass portion of the operation was  notable for stable hemodynamics.  Cardiopulmonary bypass was begun.  Vacuum assist venous drainage is utilized. The incision in the pericardium is extended in both directions. Venous drainage and exposure are notably excellent. An attempt at placing a retrograde cardioplegia cannula through the right atrium into the coronary sinus using transesophageal echocardiogram guidance was unsuccessful.  An antegrade cardioplegia cannula is placed in the ascending aorta.    The patient is cooled to 28C systemic temperature.  The aortic cross clamp is applied and cold blood cardioplegia is delivered initially in an antegrade fashion through the aortic root.  The initial cardioplegic arrest is rapid with early diastolic arrest.  Repeat doses of cardioplegia are administered intermittently every 20 to 30 minutes throughout the entire cross clamp portion of the operation through the aortic root in order to maintain completely flat electrocardiogram.  Myocardial protection was felt to be excellent.   Mitral Valve Repair:  A left atriotomy incision was performed through the interatrial groove and extended partially across the back wall of the left atrium after opening the oblique sinus inferiorly.  The mitral valve is exposed using a self-retaining retractor.  The mitral valve was inspected and notable for billowing bileaflet prolapse. Both leaflets were large, redundant, thickened, and fibrotic. There were no flail segments and all primary cords were intact but elongated. There was severe calcification in the posterior annulus particularly along P2 and P3. There was severe calcification extending into the posterior wall beneath the P3 segment of the posterior leaflet. The majority of P3 was frozen and severely restricted by the calcification.  Interrupted 2-0 Ethibond horizontal mattress sutures are placed circumferentially around the entire mitral valve annulus. The sutures will ultimately  be utilized for ring  annuloplasty, and at this juncture there are utilized to suspend the valve symmetrically.  A pledgeted CV 4 Gore-Tex sutures placed in horizontal mattress fashion through the head of the anterior papillary muscle and tied. A similar pledgeted CV 4 Gore-Tex suture is placed in the head of the posterior papillary muscle and tied. A non-pledgeted CV 4 Gore-Tex suture is placed in head of the anterior papillary muscle incorporating the pledgets from the first horizontal mattress suture. Similarly, a non-pledgeted CV 4 Gore-Tex sutures placed to the head of the posterior papillary muscle incorporating the pledgets from the previous horizontal mattress suture. The 8 strands from these Gore-Tex sutures will later be utilized for artificial Gore-Tex neo-cord placement.  The indentation (cleft) between P1 and P2 is closed using interrupted 4-0 Prolene suture. Similarly, the indentation between P2 and P3 is closed using interrupted 4-0 Prolene suture.  At this time the area of severe calcification is inspected. It becomes clear that some decalcification will be required in order to restore mobility to the P3 segment. The P3 segment and the nearby adjacent portion of P2 are mobilized off of the posterior annulus with an 11 blade knife. Some of the secondary cords to this region are divided to facilitate mobilization away from the underlying calcification in the posterior annulus and posterior wall of the left ventricle. The annulus was decalcified with rongeurs. An elliptical patch of bovine pericardium is utilized to resuspend the P3 portion of the posterior leaflet to the posterior annulus. This is sewn in place using a 2 layer closure of running 4-0 Prolene suture.  The individual limbs of the Goretex neo-cords were retrieved from the LV chamber.  One pair of neo-cords placed into the posterior papillary muscle is now woven into the P3 portion of the posterior leaflet beginning at the free margin where they were  placed from the ventricular surface to the atrial surface, and then woven in a diamond shaped fashion towards the posterior mitral annulus.  The Goretex sutures were then tied while the LV was distended with saline so as to adjust the length of the neocords to the appropriate length.  A second pair of neo-cords from the anterior papillary muscle is woven into the P2 portion of the posterior leaflet and subsequently woven in a diamond-shaped fashion towards the posterior mitral annulus. These sutures are again tied while the left ventricle was distended with saline.  The valve was tested with saline and sized to a 36 mm annuloplasty ring, based upon the transverse distance between the left and right commissures and the height of the anterior leaflet, corresponding to a size just slightly larger than the overall surface area of the anterior leaflet.  A Sorin Memo 3D annuloplasty ring (size 19mm, catalog [id number], serial [id number]) was secured in place uneventfully. The valve was tested with saline and appeared reasonably competent.   The remaining. Gore-Tex neo-cord from the anterior papillary muscle are placed into the P2 segment of the posterior leaflet to address some residual prolapse. The remaining Gore-tex neo-cord from the posterior papillary muscle are placed into the A2 segment of the anterior leaflet.  The valve is again tested with saline and appears to be perfectly competent with a broad symmetrical line of coaptation of the anterior and posterior leaflet. There is no residual leak. The line of coaptation of the anterior and posterior leaflet was confirmed using the blue ink test.  Rewarming is begun.   Procedure Completion:  The atriotomy was closed using  a 2-layer closure of running 3-0 Prolene suture after placing a sump drain across the mitral valve to serve as a left ventricular vent.  One final dose of warm retrograde "hot shot" cardioplegia was administered retrograde through the coronary sinus  catheter while all air was evacuated through the aortic root.  The aortic cross clamp was removed after a total cross clamp time of 206 minutes.  Epicardial pacing wires are fixed to the inferior wall of the right ventricule and to the right atrial appendage. The patient is rewarmed to 37C temperature. The left ventricular vent is removed.  The patient is ventilated and flow volumes turndown while the mitral valve repair is inspected using transesophageal echocardiogram. The valve repair appears intact with no residual leak. The antegrade cardioplegia cannula is now removed. The patient is weaned and disconnected from cardiopulmonary bypass.  The patient's rhythm at separation from bypass was AV paced.  The patient was weaned from bypass  without any inotropic support. Total cardiopulmonary bypass time for the operation was 253 minutes.  Followup transesophageal echocardiogram performed after separation from bypass revealed  a well-seated annuloplasty ring in the mitral position with a normal functioning mitral valve. There was no residual leak.  Left ventricular function was unchanged from preoperatively.  The mean gradient across the mitral valve was estimated to be 3 mmHg.  The femoral arterial and venous cannulae were removed uneventfully. There was a palpable pulse in the distal right common femoral artery after removal of the cannula. Protamine was administered to reverse the anticoagulation. The right internal jugular cannula was removed and manual pressure held on the neck for 15 minutes.  Single lung ventilation was begun. The atriotomy closure was inspected for hemostasis. The pericardial sac was drained using a 28 French Bard drain placed through the anterior port incision.  The pericardium was closed using a patch of core matrix bovine submucosal tissue patch. The right pleural space is irrigated with saline solution and inspected for hemostasis. The right pleural space was drained using a 28  French Bard drain placed through the posterior port incision. The miniature thoracotomy incision was closed in multiple layers in routine fashion. The right groin incision was inspected for hemostasis and closed in multiple layers in routine fashion.  The post-bypass portion of the operation was notable for stable rhythm and hemodynamics.  No blood products were administered during the operation.   Disposition:  The patient tolerated the procedure well.  The patient was reintubated using a single lumen endotracheal tube and subsequently transported to the surgical intensive care unit in stable condition. There were no intraoperative complications. All sponge instrument and needle counts are verified correct at completion of the operation.     Salvatore Decentlarence H. Cornelius Moraswen MD 04/26/2014 4:41 PM

## 2014-04-26 NOTE — Brief Op Note (Addendum)
      [address]       [name] [address]             [phone]     04/26/2014  2:25 PM  PATIENT:  Bradley Hansen  63 y.o. male  PRE-OPERATIVE DIAGNOSIS:  Mitral regurgitation  POST-OPERATIVE DIAGNOSIS:  Mitral regurgitation  PROCEDURE:  Procedure(s): MINIMALLY INVASIVE COMPLEX  MITRAL VALVE REPAIR (MVR) WITH 36 MEMO 3-D RING ANNULOPLASTY, BOVINE PERICARDIAL P3 PATCH AUGMENTATION, PLACEMENT OF #8 NEOCHORDS INTRAOPERATIVE TRANSESOPHAGEAL ECHOCARDIOGRAM  SURGEON:    Purcell Nails, MD  ASSISTANTS:  Rowe Clack, PA-C  ANESTHESIA:   Corky Sox, MD  CROSSCLAMP TIME:   206'  CARDIOPULMONARY BYPASS TIME: 253'  FINDINGS:  Barlow's disease with bileaflet prolapse, large redundant leaflets with moderate thickening and fibrosis, and severe calcification of P3 portion of posterior leaflet  Type II dysfunction with severe (4+) mitral regurgitation  Normal LV systolic function  Moderate LV chamber dilatation  No residual mitral regurgitation after successful valve repair   Mitral/Tricuspid/Pulmonary Valve Procedure  Mitral Valve Procedure Performed:  Repair: Annuloplasty., Annular Decalicification/debridement. and Neochrods. Number of Neochords Inserted: 8  Implant: Annuloplasty Device: Implant model number [id number], Size 36, Unique Device Identifier [id number].      Mitral Valve Etiology  MV Insufficiency: Severe  MV Disease: Yes.  MV Stenosis: No mitral valve stenosis.  MV Disease Functional Class: MV Disease Functional Class: Type II.  Etiology (Choose at least one and up to five): Degenerative. and Other. (BARLOW'S)  MV Lesions (Choose at least one): Mitral annular calcification. , Bileaflet Prolapse  COMPLICATIONS: None  BASELINE WEIGHT: 71.6 kg  PATIENT DISPOSITION:   TO SICU IN STABLE CONDITION  Purcell Nails 04/26/2014 4:36 PM

## 2014-04-26 NOTE — Anesthesia Postprocedure Evaluation (Signed)
  Anesthesia Post-op Note  Patient: Bradley Hansen  Procedure(s) Performed: Procedure(s): MINIMALLY INVASIVE MITRAL VALVE REPAIR (MVR) (Right) INTRAOPERATIVE TRANSESOPHAGEAL ECHOCARDIOGRAM (N/A)  Patient Location: ICU  Anesthesia Type:General  Level of Consciousness: sedated  Airway and Oxygen Therapy: Patient remains intubated per anesthesia plan  Post-op Pain: none  Post-op Assessment: Post-op Vital signs reviewed, Patient's Cardiovascular Status Stable, Respiratory Function Stable, Patent Airway, No signs of Nausea or vomiting and Pain level controlled  Post-op Vital Signs: Reviewed and stable  Last Vitals:  Filed Vitals:   04/26/14 0602  BP: 163/84  Pulse: 66  Temp: 36.4 C  Resp: 20    Complications: No apparent anesthesia complications

## 2014-04-26 NOTE — Interval H&P Note (Signed)
History and Physical Interval Note:  04/26/2014 7:18 AM  Bradley Hansen  has presented today for surgery, with the diagnosis of MR  The various methods of treatment have been discussed with the patient and family. After consideration of risks, benefits and other options for treatment, the patient has consented to  Procedure(s): MINIMALLY INVASIVE MITRAL VALVE REPAIR (MVR) (Right) INTRAOPERATIVE TRANSESOPHAGEAL ECHOCARDIOGRAM (N/A) as a surgical intervention .  The patient's history has been reviewed, patient examined, no change in status, stable for surgery.  I have reviewed the patient's chart and labs.  Questions were answered to the patient's satisfaction.     Purcell Nails

## 2014-04-26 NOTE — Progress Notes (Signed)
  Echocardiogram Echocardiogram Transesophageal has been performed.  Bradley Hansen [name] 04/26/2014, 8:48 AM

## 2014-04-26 NOTE — H&P (Signed)
[address]       [name] [address]             [phone]          CARDIOTHORACIC SURGERY HISTORY AND PHYSICAL EXAM  PCP is [name], MD Referring Provider is Wendall Stade, MD Primary Cardiologist is Rollene Rotunda, MD     Chief Complaint   Patient presents with   .  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  Several years ago he was noted to have a heart murmur on routine physical examination, and he has been followed intermittently by Dr. Gala Romney, Dr. Antoine Poche, and more recently by Dr. Eden Emms.  Echocardiograms have demonstrated bileaflet prolapse of the mitral valve with moderate to severe mitral regurgitation.  I saw him in consultation on 03/09/2011 (prior to EPIC - note in [place]). At that time he remained entirely asymptomatic and he was able to demonstrate normal exercise tolerance on exercise stress echocardiogram. He was disinclined to consider surgery at that time and plans were made for continued medical therapy with close followup. I have not personally seen him since then, but he has been followed regularly with intermittent echocardiograms. He was seen last week by Dr. Eden Emms and a repeat transthoracic echocardiogram scheduled for today. Followup consultation visit was requested and he was reevaluated initially this past February at which time he requested to make plans for surgery in April.  He was last seen in the office on 03/26/2014. Since then he reports no new problems or complaints.  The patient continues to work and live locally in [place] with his 77 year old son and one cousin. The patient reports that he remains reasonably active physically without any  significant limitations. However, the patient does note that over the past 6 months to a year he has developed shortness of breath with strenuous exertion. He states that this does not occur with normal day-to-day activities, and his job is not particularly strenuous physically. However, if he pushes himself physically he notes that he would get short of breath with exertion. His breathing recovers fairly quickly when he stops to rest. He denies any resting shortness of breath, PND, orthopnea, or lower extremity edema. He has not had any palpitations.     Past Medical History  Diagnosis Date  . Mitral valve prolapse     TEE  08/2010 severe MR, bileaflet MVP, EF 60%  . Hypercholesteremia   . Synovitis of hand 2011    right hand  . Leukopenia   . Rhinitis 2011    chronic   . Hepatitis C 2004    pt doesn't want treatment  . Fibromyalgia   . Prostatitis   . Prostadynia   . Mitral regurgitation   . MITRAL VALVE PROLAPSE 02/24/2007    Qualifier: Diagnosis of  By: Abundio Miu    . GASTROESOPHAGEAL REFLUX, NO ESOPHAGITIS 02/24/2007    Qualifier: Diagnosis of  By: Abundio Miu    . HEPATITIS C 02/24/2007    Qualifier: Diagnosis of  By: Abundio Miu    . BACK PAIN, CHRONIC 12/11/2009    Qualifier: Diagnosis of  By: Mauricio Po MD, Fayrene Fearing  Past Surgical History  Procedure Laterality Date  . Appendectomy    . Tee without cardioversion N/A 03/20/2013    Procedure: TRANSESOPHAGEAL ECHOCARDIOGRAM (TEE);  Surgeon: [name], MD;  Location: [facility] ENDOSCOPY;  Service: Cardiovascular;  Laterality: N/A;    Family History  Problem Relation Age of Onset  . Heart attack Neg Hx   . Cancer Neg Hx   . Stroke Neg Hx     Social History History  Substance Use Topics  . Smoking status: Never Smoker   . Smokeless tobacco: Never Used  . Alcohol Use: Yes     Comment: week    Prior to Admission medications   Medication Sig Start Date End Date Taking? Authorizing Provider  amiodarone  (PACERONE) 200 MG tablet Take 1 tablet (200 mg total) by mouth 2 (two) times daily. Begin 7 days prior to surgery. 03/26/14  Yes Purcell Nails, MD  esomeprazole (NEXIUM) 40 MG capsule Take 40 mg by mouth daily as needed (reflux).     Historical Provider, MD  sulfamethoxazole-trimethoprim (BACTRIM DS) 800-160 MG per tablet Take 1 tablet by mouth 2 (two) times daily as needed (for urinary burning.). Take with 6-8 oz of water. Continue x 3 days only.    Historical Provider, MD    No Known Allergies    Review of Systems:              General:                      normal appetite, normal energy, no weight gain, no weight loss, no fever             Cardiac:                      no chest pain with exertion, no chest pain at rest, + SOB with strenuous exertion, no resting SOB, no PND, no orthopnea, no palpitations, no arrhythmia, no atrial fibrillation, no LE edema, no dizzy spells, no syncope             Respiratory:                + exertional shortness of breath, no home oxygen, no productive cough, occasional dry cough, no bronchitis, no wheezing, no hemoptysis, no asthma, no pain with inspiration or cough, no sleep apnea, no CPAP at night             GI:                                occasional difficulty swallowing, + reflux, occasional heartburn, no hiatal hernia, no abdominal pain, no constipation, no diarrhea, no hematochezia, no hematemesis, no melena             GU:                              no dysuria,  no frequency, no urinary tract infection, no hematuria, no enlarged prostate, no kidney stones, no kidney disease             Vascular:                     no pain suggestive of claudication, no pain in feet, no leg cramps, no varicose veins, no DVT, no non-healing foot ulcer  Neuro:                         no stroke, no TIA's, no seizures, no headaches, no temporary blindness one eye,  no slurred speech, no peripheral neuropathy, no chronic pain, no instability of gait, no  memory/cognitive dysfunction             Musculoskeletal:         no arthritis, no joint swelling, occasional myalgias, no difficulty walking, normal mobility               Skin:                            no rash, no itching, no skin infections, no pressure sores or ulcerations             Psych:                         no anxiety, no depression, no nervousness, no unusual recent stress             Eyes:                           no blurry vision, no floaters, no recent vision changes, does not wear glasses or contacts             ENT:                            no hearing loss, no loose or painful teeth, no dentures, last saw dentist 1 1/2 year ago             Hematologic:               no easy bruising, no abnormal bleeding, no clotting disorder, no frequent epistaxis             Endocrine:                   no diabetes, does not check CBG's at home                           Physical Exam:   BP 163/84  Pulse 66  Temp(Src) 97.5 F (36.4 C) (Oral)  Resp 20  Ht 5\' 7"  (1.702 m)  Wt 71.668 kg (158 lb)  BMI 24.74 kg/m2  SpO2 100%            General:                        well-appearing             HEENT:                       Unremarkable               Neck:                           no JVD, no bruits, no adenopathy               Chest:  clear to auscultation, symmetrical breath sounds, no wheezes, no rhonchi               CV:                              RRR, grade IV/VI systolic murmur               Abdomen:                    soft, non-tender, no masses               Extremities:                 warm, well-perfused, pulses diminished but palpable, no LE edema             Rectal/GU                   Deferred             Neuro:                         Grossly non-focal and symmetrical throughout             Skin:                            Clean and dry, no rashes, no breakdown   Diagnostic Tests:  Transthoracic Echocardiography  Patient: Mathews, Stuhr MR #:  [id number] Study Date: 01/29/2014 Gender: M Age: 58 Height: 172.7cm Weight: 72.1kg BSA: 1.50m^2 Pt. Status: Room:  Layla Maw REFERRING Charlton Haws ATTENDING [place], [place] SONOGRAPHER Samule Ohm PERFORMING [facility], Outpatient cc:  ------------------------------------------------------------ LV EF: 55% - 60%  ------------------------------------------------------------ Indications: 424.0 Mitral valve disease.  ------------------------------------------------------------ History: PMH: Acquired from the patient and from the patient's chart. Dyspnea. Mitral regurgitation. Mitral valve prolapse.  ------------------------------------------------------------ Study Conclusions  - Left ventricle: The cavity size was normal. Wall thickness was normal. Systolic function was normal. The estimated ejection fraction was in the range of 55% to 60%. Wall motion was normal; there were no regional wall motion abnormalities. - Mitral valve: Severe, holosystolicprolapse, involving the anterior leaflet. Moderate to severe regurgitation directed posteriorly. - Left atrium: The atrium was moderately to severely dilated. - Atrial septum: No defect or patent foramen ovale was identified. - Pulmonary arteries: PA peak pressure: 31mm Hg (S). Transthoracic echocardiography. M-mode, complete 2D, spectral Doppler, and color Doppler. Height: Height: 172.7cm. Height: 68in. Weight: Weight: 72.1kg. Weight: 158.7lb. Body mass index: BMI: 24.2kg/m^2. Body surface area: BSA: 1.35m^2. Blood pressure: 128/80. Patient status: Outpatient. Location: [place] [facility]  ------------------------------------------------------------  ------------------------------------------------------------ Left ventricle: The cavity size was normal. Wall thickness was normal. Systolic function was normal. The estimated ejection fraction was in the range of 55% to 60%. Wall motion was normal; there were  no regional wall motion abnormalities.  ------------------------------------------------------------ Aortic valve: Structurally normal valve. Trileaflet; normal thickness leaflets. Cusp separation was normal. Mobility was not restricted. Doppler: Transvalvular velocity was within the normal range. There was no stenosis. No regurgitation.  ------------------------------------------------------------ Aorta: Aortic root: The aortic root was normal in size.  ------------------------------------------------------------ Mitral valve: Moderately thickened leaflets . Moderate myxomatous degeneration. Mobility was not restricted. Severe, holosystolicprolapse, involving the anterior leaflet. Doppler: Transvalvular velocity was within the normal range. There was no evidence for stenosis. Moderate to severe regurgitation directed posteriorly. Peak gradient:  8mm Hg (D).  ------------------------------------------------------------ Left atrium: The atrium was moderately to severely dilated.  ------------------------------------------------------------ Atrial septum: No defect or patent foramen ovale was identified.  ------------------------------------------------------------ Right ventricle: The cavity size was normal. Wall thickness was normal. Systolic function was normal.  ------------------------------------------------------------ Pulmonic valve: Structurally normal valve. Cusp separation was normal. Doppler: Transvalvular velocity was within the normal range. There was no evidence for stenosis. Mild regurgitation.  ------------------------------------------------------------ Tricuspid valve: Structurally normal valve. Doppler: Transvalvular velocity was within the normal range. Mild regurgitation.  ------------------------------------------------------------ Pulmonary artery: The main pulmonary artery was normal-sized. Systolic pressure was within the normal  range.  ------------------------------------------------------------ Right atrium: The atrium was normal in size.  ------------------------------------------------------------ Pericardium: There was no pericardial effusion.  ------------------------------------------------------------ Systemic veins: Inferior vena cava: The vessel was normal in size; the respirophasic diameter changes were in the normal range (= 50%); findings are consistent with normal central venous pressure.  ------------------------------------------------------------  2D measurements Normal Doppler measurements Normal Left ventricle Main pulmonary LVID ED, 47.5 mm 43-52 artery chord, Pressure, 31 mm Hg =30 PLAX S LVID ES, 33.6 mm 23-38 Left ventricle chord, Ea, lat 8.38 cm/s ------ PLAX ann, tiss FS, chord, 29 % >29 DP PLAX E/Ea, lat 17.06 ------ LVPW, ED 11.8 mm ------ ann, tiss IVS/LVPW 0.91 <1.3 DP ratio, ED Ea, med 6.24 cm/s ------ Ventricular septum ann, tiss IVS, ED 10.7 mm ------ DP LVOT E/Ea, med 22.92 ------ Diam, S 26 mm ------ ann, tiss Area 5.31 cm^2 ------ DP Diam 26 mm ------ LVOT Aorta Peak vel, 91.9 cm/s ------ Root diam, 35 mm ------ S ED VTI, S 15.5 cm ------ AAo AP 37 mm ------ Stroke vol 82.3 ml ------ diam, S Stroke 44.5 ml/m^ ------ Left atrium index 2 AP dim 45 mm ------ Mitral valve AP dim 2.43 cm/m^2 <2.2 Peak E vel 143 cm/s ------ index Peak A vel 151 cm/s ------ Decelerati 222 ms 150-23 on time 0 Peak 8 mm Hg ------ gradient, D Peak E/A 0.9 ------ ratio Tricuspid valve Regurg 255 cm/s ------ peak vel Peak RV-RA 26 mm Hg ------ gradient, S Max regurg 255 cm/s ------ vel Systemic veins Estimated 5 mm Hg ------ CVP Right ventricle Pressure, 31 mm Hg <30 S Sa vel, 13.6 cm/s ------ lat ann, tiss DP  ------------------------------------------------------------ Prepared and Electronically Authenticated by  [employer],  Mihai 2015-02-02T15:01:[id number]     Transesophageal Echocardiography  Patient: Zarion, Oliff MR #: [id number] Study Date: 03/20/2013 Gender: M Age: 76 Height: 170.2cm Weight: 72.3kg BSA: 1.3m^2 Pt. Status: Room: [facility]  ADMITTING Charlton Haws ATTENDING Charlton Haws PERFORMING Nahser, Isa Rankin Barrett, Rhonda REFERRING Barrett, Rhonda SONOGRAPHER Dewitt Hoes, RDCS cc:  ------------------------------------------------------------  ------------------------------------------------------------ Indications: Mitral valve prolapse 424.0.  ------------------------------------------------------------ Study Conclusions  - Aortic valve: No evidence of vegetation. - Mitral valve: Moderate prolapse, involving the anterior leaflet and the posterior leaflet. Moderate to severe regurgitation. - Left atrium: No evidence of thrombus in the atrial cavity or appendage. Transesophageal echocardiography. 2D and color Doppler. Height: Height: 170.2cm. Height: 67in. Weight: Weight: 72.3kg. Weight: 159lb. Body mass index: BMI: 25kg/m^2. Body surface area: BSA: 1.40m^2. Blood pressure: 133/78. Patient status: Outpatient. Location: Endoscopy.  ------------------------------------------------------------  ------------------------------------------------------------ Aortic valve: Structurally normal valve. Cusp separation was normal. No evidence of vegetation. Doppler: No regurgitation.  ------------------------------------------------------------ Aorta: The aorta was normal, not dilated, and non-diseased.  ------------------------------------------------------------ Mitral valve: There are multiple jets of mitral regurgitation Moderate prolapse, involving the anterior leaflet and the posterior leaflet. Doppler: Moderate to severe regurgitation.  ------------------------------------------------------------ Left atrium: No evidence of  thrombus in the atrial cavity or  appendage.  ------------------------------------------------------------ Tricuspid valve: Structurally normal valve. Doppler: Mild regurgitation.  ------------------------------------------------------------ Post procedure conclusions Ascending Aorta:  - The aorta was normal, not dilated, and non-diseased.  ------------------------------------------------------------ Prepared and Electronically Authenticated by  Kristeen Miss 2014-03-25T12:50:12.663   CARDIAC CATHETERIZATION  PROCEDURE: Right heart catheterization, Left heart catheterization with selective coronary angiography, left ventriculogram.   INDICATIONS: Preoperative evaluation for mitral valve surgery  The risks, benefits, and details of the procedure were explained to the patient. The patient verbalized understanding and wanted to proceed. Informed written consent was obtained.   PROCEDURE TECHNIQUE: After Xylocaine anesthesia a 74F and 27F sheaths were placed in the left femoral vein and artery respectively with single anterior needle wall sticks. The Swan-Ganz catheter was advanced to the pulmonary artery. Hemodynamic assessment was performed with this catheter. Left coronary angiography was done using a Judkins L4 guide catheter. Right coronary angiography was done using a Judkins R4 guide catheter. Left ventriculography was done using a pigtail catheter. Manual compression will be used for hemostasis.   CONTRAST: Total of 55 cc.   COMPLICATIONS: None.   HEMODYNAMICS: Aortic pressure was 144/78; LV pressure was 146/6; LVEDP 14. There was no gradient between the left ventricle and aorta. Pulmonary capillary wedge pressure 14/13. PA pressure 28/11. RV pressure 29/3, RVEDP 5 mmHg. RA pressure 7/5. PA sat 70%. AO sat 94%. Cardiac output by Fick 5.5 L per minute. Cardiac index 3.02   ANGIOGRAPHIC DATA: The left main coronary artery is widely patent.  The left anterior descending artery is a large vessel which wraps around the  apex. In the mid LAD, at the origin of a large second diagonal, there is a focal 25% stenosis. The first diagonal is small but patent. The second diagonal is large and widely patent.   The left circumflex artery is a large vessel. There is a long first obtuse marginal which is widely patent. The remainder of the circumflex system appears widely patent. There is a second obtuse marginal which is patent as well.   The right coronary artery is a large dominant vessel. There is no significant atherosclerosis noted. The posterior lateral artery is large. The posterior descending artery is small but patent.   LEFT VENTRICULOGRAM: Left ventricular angiogram was done in the 30 RAO projection and revealed normal left ventricular wall motion and systolic function with an estimated ejection fraction of 60 %. LVEDP was 14 mmHg. Significant mitral regurgitation is noted.   IMPRESSIONS:   1. Normal left main coronary artery. 2. Mild disease in the mid left anterior descending artery which is not significant. Patent diagonal. 3. Normal left circumflex artery and its branches. 4. Normal right coronary artery. 5. Normal left ventricular systolic function. LVEDP 14 mmHg. Ejection fraction 60% with significant mitral regurgitation. 6. Normal pulmonary artery pressure. Mean PA pressure 18 mm Hg. PA sat 70%. AO sat 94%. Cardiac output by Fick 5.5 L per minute. Cardiac index 3.02   RECOMMENDATION: No significant coronary artery disease. Proceed with mitral valve repair.   CT ANGIOGRAPHY CHEST, ABDOMEN AND PELVIS   TECHNIQUE: Multidetector CT imaging through the chest, abdomen and pelvis was performed using the standard protocol during bolus administration of intravenous contrast. Multiplanar reconstructed images and MIPs were obtained and reviewed to evaluate the vascular anatomy.   CONTRAST:  OMNIPAQUE IOHEXOL 350 MG/ML SOLN   COMPARISON:  None.   FINDINGS: Vascular Findings:   Normal caliber of the  thoracic aorta with measurements as follows. Review  of the precontrast images are negative for the presence of an intramural hematoma. There is a very minimal amount of calcified atherosclerotic plaque involving the caudal aspect of the aortic arch, not result in hemodynamically significant stenosis. No thoracic aortic dissection or periaortic stranding. Bovine configuration of the aortic arch is incidentally noted. The branch vessels of the aortic arch are widely patent throughout their imaged course. The descending thoracic aorta is of normal caliber.   Cardiomegaly. Calcifications within the mitral valve leaflets. No pericardial effusion.   Although this examination was not tailored for the evaluation of the pulmonary arteries, there are no discrete filling defects within the pulmonary arterial tree to the level of the bilateral subsegmental pulmonary arteries.   -------------------------------------------------------------   Thoracic aortic measurements:   Sinotubular junction   33 mm as measured in greatest oblique coronal dimension.   Proximal ascending aorta   36 mm as measured in greatest oblique axial dimension at the level of the main pulmonary artery and approximately 37 mm in greatest oblique coronal dimension (coronal image 39, series 601).   Aortic arch aorta   27 mm as measured in greatest oblique sagittal dimension.   Proximal descending thoracic aorta   25 mm as measured in greatest oblique axial dimension at the level of the main pulmonary artery.   Distal descending thoracic aorta   24 mm as measured in greatest oblique axial dimension at the level of the diaphragmatic hiatus.   Review of the MIP images confirms the above findings.   -------------------------------------------------------------   Non-Vascular Findings of the chest:   There is minimal grossly symmetric subpleural ground-glass atelectasis. No focal airspace opacities. No pleural  effusion. There is a punctate (approximately 2 mm) noncalcified subpleural nodule within the left upper lobe (image 20, series 5 as well as a punctate (2 mm) noncalcified nodule within the right upper lobe (image 25, series 5). The central pulmonary airways are widely patent. No mediastinal, hilar or axillary lymphadenopathy.   No acute or aggressive osseous abnormalities within the chest. Normal appearance of the thyroid gland.   --------------------------------------------------------------   Vascular Findings of the abdomen and pelvis:   Abdominal aorta: Normal caliber the abdominal aorta. The abdominal aorta is widely patent without hemodynamically significant narrowing. No abdominal aortic dissection or periaortic stranding.   Celiac artery: Note is made of a common trunk of the celiac and the SMA. The celiac artery otherwise appears normal and widely patent throughout its imaged course.   SMA: Note is made of a common origin of the celiac and SMA. The SMA is otherwise normal and widely patent throughout its imaged course.   Right Renal artery: Solitary; there is a minimal amount of mixed calcified and noncalcified atherosclerotic plaque involving the caudal proximal aspect of the right renal artery, not resulting in hemodynamically significant stenosis.   Left Renal artery: Solitary; widely patent without hemodynamically significant narrowing.   IMA: Patent   Pelvic vasculature: The bilateral common and external iliac arteries are widely patent and free if clinically significant narrowing. The bilateral internal iliac arteries are disease proximally with mixed calcified and noncalcified atherosclerotic plaque low remain patent and of normal caliber.   Review of the MIP images confirms the above findings.     --------------------------------------------------------------------------------   Nonvascular Findings of the abdomen and pelvis:   Normal hepatic contour.  No discrete hepatic lesions. No appears of the gallbladder. No intra extrahepatic biliary duct dilatation no ascites.   There is symmetric enhancement and excretion of the bilateral  kidneys. No definite renal stones on this postcontrast examination. No discrete renal lesions. No urinary obstruction or perinephric stranding. Normal appearance of the bilateral adrenal glands and pancreas. Crescentic calcifications are noted about the splenic capsule (represented axial image 18, series 2). No perisplenic fluid.   Moderate colonic stool burden without evidence of obstruction. The bowel is otherwise normal in course and caliber without discrete area of wall thickening or obstruction. Normal appearance of the appendix. No pneumoperitoneum, pneumatosis or portal venous gas.   No retroperitoneal, mesenteric, pelvic or inguinal lymphadenopathy.   The prostate is enlarged. There is mild diffuse thickening of the urinary bladder wall, possibly accentuated due to underdistention. No free fluid within the pelvis.   No acute or aggressive osseus abnormalities within the abdomen or pelvis. There is a mild (or 25%) compression deformity involving the superior endplate of the L3 vertebral body without associated retropulsion or discrete fracture line. Mild to moderate multilevel lumbar spine DDD.   IMPRESSION: 1. No evidence of thoracic or abdominal aortic aneurysm or dissection. 2. Cardiomegaly.  Calcifications within the mitral valve annulus. 3. Indeterminate bilateral punctate (approximately 2 mm) pulmonary nodules. Comparison to prior outside examinations (if available) is recommended. Otherwise, if the patient is at high risk for bronchogenic carcinoma, follow-up chest CT at 1 year is recommended. If the patient is at low risk, no follow-up is needed. This recommendation follows the consensus statement: Guidelines for Management of Small Pulmonary Nodules Detected on CT Scans: A Statement  from the Fleischner Society as published in Radiology 2005; 237:395-400. 4. Linear calcifications within the splenic capsule, nonspecific though presumably the sequela of remote injury and/or infection. 5. Enlarged prostate with mass effect upon the undersurface of the bladder. This finding is associated with apparent thickening of the urinary bladder wall possibly indicative of bladder outlet obstruction. Clinical correlation is advised.     Electronically Signed   By: Simonne Come M.D.   On: 02/05/2014 16:20   Impression:  Patient has long-standing mitral valve prolapse with severe primary mitral regurgitation. He has recently developed stable symptoms of exertional shortness of breath, New York Heart Association functional class II. Cardiac catheterization is notable for absence of significant coronary artery disease. Pulmonary artery pressures are normal. CT angiography demonstrates findings relatively favorable for minimally invasive approach for surgery.   Plan:  The rationale for elective mitral valve repair surgery has been explained, including a comparison between surgery and continued medical therapy with close follow-up.  The likelihood of successful and durable valve repair has been discussed with particular reference to the findings of their recent echocardiogram.  Based upon these findings and previous experience, I have quoted them a greater than 95 percent likelihood of successful valve repair.  In the unlikely event that their valve cannot be successfully repaired, we discussed the possibility of replacing the mitral valve using a mechanical prosthesis with the attendant need for long-term anticoagulation versus the alternative of replacing it using a bioprosthetic tissue valve with its potential for late structural valve deterioration and failure, depending upon the patient's longevity.  The patient specifically requests that if the mitral valve must be replaced that it be done  using a mechanical valve.  Alternative surgical approaches have been discussed including a comparison between conventional sternotomy and minimally-invasive techniques.  The relative risks and benefits of each have been reviewed as they pertain to the patient's specific circumstances, and all of their questions have been addressed.  Specific risks potentially related to the minimally-invasive approach were discussed  at length, including but not limited to risk of conversion to full or partial sternotomy, aortic dissection or other major vascular complication, unilateral acute lung injury or pulmonary edema, phrenic nerve dysfunction or paralysis, rib fracture, chronic pain, lung hernia, or lymphocele.   He additionally understands and accepts all other risks associated with surgery including but not limited to risk of death, stroke or other neurologic complication, myocardial infarction, congestive heart failure, respiratory failure, renal failure, bleeding requiring transfusion and/or reexploration, arrhythmia, infection or other wound complications, pneumonia, pleural and/or pericardial effusion, pulmonary embolus, aortic dissection or other major vascular complication, or delayed complications related to valve repair or replacement including but not limited to structural valve deterioration and failure, thrombosis, embolization, endocarditis, or paravalvular leak.  All of their questions have been answered.     Salvatore Decent. Cornelius Moras, MD

## 2014-04-27 ENCOUNTER — Encounter (HOSPITAL_COMMUNITY): Payer: Self-pay | Admitting: Thoracic Surgery (Cardiothoracic Vascular Surgery)

## 2014-04-27 ENCOUNTER — Inpatient Hospital Stay (HOSPITAL_COMMUNITY): Payer: BC Managed Care – PPO

## 2014-04-27 DIAGNOSIS — I059 Rheumatic mitral valve disease, unspecified: Principal | ICD-10-CM

## 2014-04-27 LAB — POCT I-STAT 3, ART BLOOD GAS (G3+)
Acid-base deficit: 3 mmol/L — ABNORMAL HIGH (ref 0.0–2.0)
Acid-base deficit: 1 mmol/L (ref 0.0–2.0)
Bicarbonate: 22.6 mEq/L (ref 20.0–24.0)
Bicarbonate: 24.5 mEq/L — ABNORMAL HIGH (ref 20.0–24.0)
Bicarbonate: 26.1 mEq/L — ABNORMAL HIGH (ref 20.0–24.0)
O2 Saturation: 90 %
O2 Saturation: 99 %
O2 Saturation: 99 %
Patient temperature: 37.1
Patient temperature: 98.6
TCO2: 24 mmol/L (ref 0–100)
TCO2: 26 mmol/L (ref 0–100)
TCO2: 28 mmol/L (ref 0–100)
pCO2 arterial: 42.7 mmHg (ref 35.0–45.0)
pCO2 arterial: 44.9 mmHg (ref 35.0–45.0)
pCO2 arterial: 46.1 mmHg — ABNORMAL HIGH (ref 35.0–45.0)
pH, Arterial: 7.331 — ABNORMAL LOW (ref 7.350–7.450)
pH, Arterial: 7.345 — ABNORMAL LOW (ref 7.350–7.450)
pH, Arterial: 7.362 (ref 7.350–7.450)
pO2, Arterial: 62 mmHg — ABNORMAL LOW (ref 80.0–100.0)
pO2, Arterial: 145 mmHg — ABNORMAL HIGH (ref 80.0–100.0)
pO2, Arterial: 143 mmHg — ABNORMAL HIGH (ref 80.0–100.0)

## 2014-04-27 LAB — BASIC METABOLIC PANEL
BUN: 7 mg/dL (ref 6–23)
CO2: 22 mEq/L (ref 19–32)
Calcium: 8.4 mg/dL (ref 8.4–10.5)
Chloride: 108 mEq/L (ref 96–112)
Creatinine, Ser: 0.82 mg/dL (ref 0.50–1.35)
GFR calc Af Amer: >90 mL/min (ref >90)
GFR calc non Af Amer: >90 mL/min (ref >90)
Glucose, Bld: 141 mg/dL — ABNORMAL HIGH (ref 70–99)
Potassium: 4.5 mEq/L (ref 3.7–5.3)
Sodium: 142 mEq/L (ref 137–147)

## 2014-04-27 LAB — BLOOD GAS, ARTERIAL
Acid-base deficit: 3.4 mmol/L — ABNORMAL HIGH (ref 0.0–2.0)
Bicarbonate: 21.4 mEq/L (ref 20.0–24.0)
Drawn by: 331001
FIO2: 0.5 %
MECHVT: 640 mL
O2 Saturation: 97.9 %
PEEP: 5 cmH2O
Patient temperature: 98.6
Pressure support: 10 cmH2O
RATE: 14 resp/min
TCO2: 22.6 mmol/L (ref 0–100)
pCO2 arterial: 40.4 mmHg (ref 35.0–45.0)
pH, Arterial: 7.344 — ABNORMAL LOW (ref 7.350–7.450)
pO2, Arterial: 106 mmHg — ABNORMAL HIGH (ref 80.0–100.0)

## 2014-04-27 LAB — CBC
HCT: 27.6 % — ABNORMAL LOW (ref 39.0–52.0)
HCT: 27.3 % — ABNORMAL LOW (ref 39.0–52.0)
Hemoglobin: 9.1 g/dL — ABNORMAL LOW (ref 13.0–17.0)
Hemoglobin: 9 g/dL — ABNORMAL LOW (ref 13.0–17.0)
MCH: 30.5 pg (ref 26.0–34.0)
MCH: 31 pg (ref 26.0–34.0)
MCHC: 33 g/dL (ref 30.0–36.0)
MCHC: 33 g/dL (ref 30.0–36.0)
MCV: 92.6 fL (ref 78.0–100.0)
MCV: 94.1 fL (ref 78.0–100.0)
Platelets: 75 10*3/uL — ABNORMAL LOW (ref 150–400)
Platelets: 76 10*3/uL — ABNORMAL LOW (ref 150–400)
RBC: 2.98 MIL/uL — ABNORMAL LOW (ref 4.22–5.81)
RBC: 2.9 MIL/uL — ABNORMAL LOW (ref 4.22–5.81)
RDW: 12.4 % (ref 11.5–15.5)
RDW: 12.5 % (ref 11.5–15.5)
WBC: 7.6 10*3/uL (ref 4.0–10.5)
WBC: 10.5 10*3/uL (ref 4.0–10.5)

## 2014-04-27 LAB — MAGNESIUM
Magnesium: 2.6 mg/dL — ABNORMAL HIGH (ref 1.5–2.5)
Magnesium: 2 mg/dL (ref 1.5–2.5)

## 2014-04-27 LAB — POCT I-STAT, CHEM 8
BUN: 8 mg/dL (ref 6–23)
Calcium, Ion: 1.25 mmol/L (ref 1.13–1.30)
Chloride: 98 mEq/L (ref 96–112)
Creatinine, Ser: 0.9 mg/dL (ref 0.50–1.35)
Glucose, Bld: 174 mg/dL — ABNORMAL HIGH (ref 70–99)
HCT: 29 % — ABNORMAL LOW (ref 39.0–52.0)
Hemoglobin: 9.9 g/dL — ABNORMAL LOW (ref 13.0–17.0)
Potassium: 3.7 mEq/L (ref 3.7–5.3)
Sodium: 141 mEq/L (ref 137–147)
TCO2: 28 mmol/L (ref 0–100)

## 2014-04-27 LAB — GLUCOSE, CAPILLARY
Glucose-Capillary: 78 mg/dL (ref 70–99)
Glucose-Capillary: 92 mg/dL (ref 70–99)
Glucose-Capillary: 90 mg/dL (ref 70–99)
Glucose-Capillary: 134 mg/dL — ABNORMAL HIGH (ref 70–99)
Glucose-Capillary: 120 mg/dL — ABNORMAL HIGH (ref 70–99)
Glucose-Capillary: 103 mg/dL — ABNORMAL HIGH (ref 70–99)
Glucose-Capillary: 120 mg/dL — ABNORMAL HIGH (ref 70–99)

## 2014-04-27 LAB — CREATININE, SERUM
Creatinine, Ser: 0.87 mg/dL (ref 0.50–1.35)
GFR calc Af Amer: >90 mL/min (ref >90)
GFR calc non Af Amer: >90 mL/min (ref >90)

## 2014-04-27 MED ORDER — POTASSIUM CHLORIDE 10 MEQ/50ML IV SOLN
10.0000 meq | INTRAVENOUS | Status: AC
  Administered 2014-04-27 (×3): 10 meq via INTRAVENOUS

## 2014-04-27 MED ORDER — WARFARIN - PHYSICIAN DOSING INPATIENT
Freq: Every day | Status: DC
  Administered 2014-04-28 – 2014-05-02 (×2)

## 2014-04-27 MED ORDER — MORPHINE SULFATE 2 MG/ML IJ SOLN
2.0000 mg | INTRAMUSCULAR | Status: DC | PRN
  Administered 2014-04-27 – 2014-04-28 (×4): 2 mg via INTRAVENOUS
  Filled 2014-04-27 (×3): qty 1

## 2014-04-27 MED ORDER — SODIUM CHLORIDE 0.45 % IV SOLN
INTRAVENOUS | Status: DC
  Administered 2014-04-27: 10:00:00 via INTRAVENOUS

## 2014-04-27 MED ORDER — ASPIRIN EC 81 MG PO TBEC
81.0000 mg | DELAYED_RELEASE_TABLET | Freq: Every day | ORAL | Status: DC
  Administered 2014-04-28 – 2014-05-02 (×5): 81 mg via ORAL
  Filled 2014-04-27 (×6): qty 1

## 2014-04-27 MED ORDER — WARFARIN SODIUM 2.5 MG PO TABS
2.5000 mg | ORAL_TABLET | Freq: Every day | ORAL | Status: DC
  Administered 2014-04-27 – 2014-04-29 (×3): 2.5 mg via ORAL
  Filled 2014-04-27 (×4): qty 1

## 2014-04-27 MED ORDER — INSULIN ASPART 100 UNIT/ML ~~LOC~~ SOLN
0.0000 [IU] | SUBCUTANEOUS | Status: DC
  Administered 2014-04-27: 4 [IU] via SUBCUTANEOUS
  Administered 2014-04-27 (×2): 2 [IU] via SUBCUTANEOUS

## 2014-04-27 MED ORDER — INSULIN ASPART 100 UNIT/ML ~~LOC~~ SOLN
0.0000 [IU] | SUBCUTANEOUS | Status: DC
  Administered 2014-04-27: 2 [IU] via SUBCUTANEOUS

## 2014-04-27 MED ORDER — FUROSEMIDE 10 MG/ML IJ SOLN
20.0000 mg | Freq: Four times a day (QID) | INTRAMUSCULAR | Status: DC
  Administered 2014-04-27 (×2): 20 mg via INTRAVENOUS

## 2014-04-27 MED FILL — Sodium Chloride IV Soln 0.9%: INTRAVENOUS | Qty: 1000 | Status: AC

## 2014-04-27 MED FILL — Sodium Bicarbonate IV Soln 8.4%: INTRAVENOUS | Qty: 50 | Status: AC

## 2014-04-27 MED FILL — Lidocaine HCl IV Inj 20 MG/ML: INTRAVENOUS | Qty: 10 | Status: AC

## 2014-04-27 MED FILL — Electrolyte-R (PH 7.4) Solution: INTRAVENOUS | Qty: 3000 | Status: AC

## 2014-04-27 MED FILL — Mannitol IV Soln 20%: INTRAVENOUS | Qty: 500 | Status: AC

## 2014-04-27 MED FILL — Heparin Sodium (Porcine) Inj 1000 Unit/ML: INTRAMUSCULAR | Qty: 30 | Status: AC

## 2014-04-27 NOTE — Procedures (Signed)
Extubation Procedure Note  Patient Details:   Name: Bradley Hansen DOB: 1951/09/27 MRN: [id number]   Airway Documentation:     Evaluation  O2 sats: stable throughout Complications: No apparent complications Patient did tolerate procedure well. Bilateral Breath Sounds: Rhonchi Suctioning: Oral Yes  Pt tolerated rapid wean, VC, -20 NIF, positive for cuff leak, extubated to 4lpm [place]. No stridor or dyspnea noted after extubation. All vital signs are within normal limits at this time. RT will continue to monitor.   Armando Gang 04/27/2014, 8:40 AM

## 2014-04-27 NOTE — Progress Notes (Signed)
Utilization Review Completed.  

## 2014-04-27 NOTE — Progress Notes (Signed)
Dr. Cornelius Moras called prior to placing pt back on full support due to low parameters and ABG.  VC 0.5L and Nif -20. Dr. Cornelius Moras ordered to place pt back on full support and have pt ready to extubate during morning rounds. RT will continue to monitor.

## 2014-04-27 NOTE — Progress Notes (Addendum)
      [address]       [name] [address]             [phone]        CARDIOTHORACIC SURGERY PROGRESS NOTE   R1 Day Post-Op Procedure(s) (LRB): MINIMALLY INVASIVE MITRAL VALVE REPAIR (MVR) (Right) INTRAOPERATIVE TRANSESOPHAGEAL ECHOCARDIOGRAM (N/A)  Subjective: Awake and alert on vent.  Vent not weaned any further because all RT personal were tied up with another patient.  Looks good.  Denies pain.  Objective: Vital signs: BP Readings from Last 1 Encounters:  04/27/14 124/60   Pulse Readings from Last 1 Encounters:  04/27/14 70   Resp Readings from Last 1 Encounters:  04/27/14 13   Temp Readings from Last 1 Encounters:  04/27/14 98.1 F (36.7 C)     Hemodynamics: PAP: (21-45)/(13-26) 34/18 mmHg CO:  [2.3 L/min-4.7 L/min] 4.7 L/min CI:  [1.2 L/min/m2-2.6 L/min/m2] 2.6 L/min/m2  Physical Exam:  Rhythm:   Junctional - AAI pacing  Breath sounds: Fairly clear  Heart sounds:  RRR w/out murmur  Incisions:  Dressings dry, intact  Abdomen:  Soft, non-distended, non-tender  Extremities:  Warm, well-perfused    Intake/Output from previous day: 04/30 0701 - 05/01 0700 In: 6755.9 [I.V.:4881.9; Blood:310; NG/GT:50; IV Piggyback:1514] Out: 4315 [QMGQQ:7619; Blood:300; Chest Tube:450] Intake/Output this shift:    Lab Results:  CBC: Recent Labs  04/26/14 2300 04/27/14 0325  WBC 5.9 7.6  HGB 8.7* 9.1*  HCT 25.8* 27.6*  PLT 67* 75*    BMET:  Recent Labs  04/26/14 2258 04/26/14 2300 04/27/14 0325  NA 139  --  142  K 4.4  --  4.5  CL 112  --  108  CO2  --   --  22  GLUCOSE 127*  --  141*  BUN 4*  --  7  CREATININE 0.80 0.79 0.82  CALCIUM  --   --  8.4     CBG (last 3)   Recent Labs  04/26/14 1930 04/26/14 2045 04/26/14 2341  GLUCAP 92 90 100*    ABG    Component Value Date/Time   PHART 7.344* 04/27/2014 0406   PCO2ART 40.4 04/27/2014 0406   PO2ART 106.0* 04/27/2014 0406   HCO3 21.4 04/27/2014 0406   TCO2 22.6 04/27/2014 0406     ACIDBASEDEF 3.4* 04/27/2014 0406   O2SAT 97.9 04/27/2014 0406    CXR: Patchy opacity RUL, otherwise looks good  Assessment/Plan: S/P Procedure(s) (LRB): MINIMALLY INVASIVE MITRAL VALVE REPAIR (MVR) (Right) INTRAOPERATIVE TRANSESOPHAGEAL ECHOCARDIOGRAM (N/A)  Doing well POD1 Maintaining AAI paced rhythm w/ stable hemodynamics on no drips Looks more than ready for extubation Expected post op acute blood loss anemia, stable Expected post op volume excess Expected post op atelectasis, mild Patchy opacity right lung, likely related to surgery and intermittent single lung ventilation Post op thrombocytopenia, no sign of bleeding   Wean vent and extubate  Continue AAI pacing for now and hold amiodarone, beta blockers  Mobilize post extubation  Pulm toilet  Diuresis  D/C swan ganz, Aline  Leave chest tubes in at least 2-3 days  Start coumadin slowly  Watch platelet count  No lovenox or heparin   Purcell Nails 04/27/2014 7:24 AM

## 2014-04-27 NOTE — Progress Notes (Signed)
EKG CRITICAL VALUE     12 lead EKG performed.  Critical value noted.  Marlane Hatcher, RN notified.   Ernestina Penna, CCT 04/27/2014 7:22 AM

## 2014-04-27 NOTE — Progress Notes (Signed)
POD # 1 MVRepair  BP 96/47  Pulse 70  Temp(Src) 98.1 F (36.7 C) (Oral)  Resp 19  Ht 5\' 7"  (1.702 m)  Wt 169 lb 1.5 oz (76.7 kg)  BMI 26.48 kg/m2  SpO2 99%   Intake/Output Summary (Last 24 hours) at 04/27/14 1706 Last data filed at 04/27/14 1600  Gross per 24 hour  Intake 3535.92 ml  Output   7055 ml  Net -3519.08 ml    K= 3.8- supplemented, creatinine 0.9  He has diuresed well with 2 doses of 20 mg of lasix- his BP is low,  Will stop lasix and reassess his volume status tomorrow

## 2014-04-28 ENCOUNTER — Inpatient Hospital Stay (HOSPITAL_COMMUNITY): Payer: BC Managed Care – PPO

## 2014-04-28 LAB — BASIC METABOLIC PANEL
BUN: 13 mg/dL (ref 6–23)
CO2: 29 mEq/L (ref 19–32)
Calcium: 8.3 mg/dL — ABNORMAL LOW (ref 8.4–10.5)
Chloride: 101 mEq/L (ref 96–112)
Creatinine, Ser: 0.96 mg/dL (ref 0.50–1.35)
GFR calc Af Amer: >90 mL/min (ref >90)
GFR calc non Af Amer: 88 mL/min — ABNORMAL LOW (ref >90)
Glucose, Bld: 109 mg/dL — ABNORMAL HIGH (ref 70–99)
Potassium: 4 mEq/L (ref 3.7–5.3)
Sodium: 139 mEq/L (ref 137–147)

## 2014-04-28 LAB — GLUCOSE, CAPILLARY
Glucose-Capillary: 105 mg/dL — ABNORMAL HIGH (ref 70–99)
Glucose-Capillary: 95 mg/dL (ref 70–99)
Glucose-Capillary: 111 mg/dL — ABNORMAL HIGH (ref 70–99)

## 2014-04-28 LAB — PROTIME-INR
INR: 1.4 (ref 0.00–1.49)
Prothrombin Time: 16.8 seconds — ABNORMAL HIGH (ref 11.6–15.2)

## 2014-04-28 LAB — CBC
HCT: 24.6 % — ABNORMAL LOW (ref 39.0–52.0)
Hemoglobin: 8 g/dL — ABNORMAL LOW (ref 13.0–17.0)
MCH: 30.5 pg (ref 26.0–34.0)
MCHC: 32.5 g/dL (ref 30.0–36.0)
MCV: 93.9 fL (ref 78.0–100.0)
Platelets: 66 10*3/uL — ABNORMAL LOW (ref 150–400)
RBC: 2.62 MIL/uL — ABNORMAL LOW (ref 4.22–5.81)
RDW: 12.5 % (ref 11.5–15.5)
WBC: 8.2 10*3/uL (ref 4.0–10.5)

## 2014-04-28 MED ORDER — SODIUM CHLORIDE 0.9 % IJ SOLN
3.0000 mL | INTRAMUSCULAR | Status: DC | PRN
  Administered 2014-05-02: 3 mL via INTRAVENOUS

## 2014-04-28 MED ORDER — MAGNESIUM HYDROXIDE 400 MG/5ML PO SUSP: 30.0000 mL | Freq: Every day | ORAL | Status: DC | PRN

## 2014-04-28 MED ORDER — GUAIFENESIN-DM 100-10 MG/5ML PO SYRP: 15.0000 mL | ORAL_SOLUTION | ORAL | Status: DC | PRN

## 2014-04-28 MED ORDER — POTASSIUM CHLORIDE CRYS ER 20 MEQ PO TBCR
20.0000 meq | EXTENDED_RELEASE_TABLET | Freq: Two times a day (BID) | ORAL | Status: DC
  Administered 2014-04-28 – 2014-05-02 (×10): 20 meq via ORAL
  Filled 2014-04-28 (×13): qty 1

## 2014-04-28 MED ORDER — MOVING RIGHT ALONG BOOK
Freq: Once | Status: AC
  Administered 2014-04-28: 12:00:00
  Filled 2014-04-28: qty 1

## 2014-04-28 MED ORDER — AMIODARONE HCL IN DEXTROSE 360-4.14 MG/200ML-% IV SOLN
30.0000 mg/h | INTRAVENOUS | Status: DC
  Administered 2014-04-28: 30 mg/h via INTRAVENOUS
  Filled 2014-04-28: qty 200

## 2014-04-28 MED ORDER — SODIUM CHLORIDE 0.9 % IJ SOLN
3.0000 mL | Freq: Two times a day (BID) | INTRAMUSCULAR | Status: DC
  Administered 2014-04-28 – 2014-05-02 (×8): 3 mL via INTRAVENOUS

## 2014-04-28 MED ORDER — AMIODARONE HCL IN DEXTROSE 360-4.14 MG/200ML-% IV SOLN
INTRAVENOUS | Status: AC
  Administered 2014-04-28: 30 mg/h via INTRAVENOUS
  Filled 2014-04-28: qty 200

## 2014-04-28 MED ORDER — ALPRAZOLAM 0.25 MG PO TABS: 0.2500 mg | ORAL_TABLET | Freq: Four times a day (QID) | ORAL | Status: DC | PRN

## 2014-04-28 MED ORDER — FUROSEMIDE 40 MG PO TABS
40.0000 mg | ORAL_TABLET | Freq: Every day | ORAL | Status: DC
  Administered 2014-04-28 – 2014-05-02 (×5): 40 mg via ORAL
  Filled 2014-04-28 (×6): qty 1

## 2014-04-28 MED ORDER — ALUM & MAG HYDROXIDE-SIMETH 200-200-20 MG/5ML PO SUSP: 15.0000 mL | ORAL | Status: DC | PRN

## 2014-04-28 MED ORDER — SODIUM CHLORIDE 0.9 % IV SOLN: 250.0000 mL | INTRAVENOUS | Status: DC | PRN

## 2014-04-28 NOTE — Progress Notes (Signed)
No complaints  BP 101/42  Pulse 55  Temp(Src) 100 F (37.8 C) (Oral)  Resp 18  Ht 5\' 7"  (1.702 m)  Wt 158 lb 8.2 oz (71.9 kg)  BMI 24.82 kg/m2  SpO2 99%   Intake/Output Summary (Last 24 hours) at 04/28/14 1725 Last data filed at 04/28/14 1219  Gross per 24 hour  Intake 585.78 ml  Output    985 ml  Net -399.22 ml    In junctional rhythm at 55 off amiodarone, BP tolerating so far

## 2014-04-28 NOTE — Progress Notes (Signed)
2 Days Post-Op Procedure(s) (LRB): MINIMALLY INVASIVE MITRAL VALVE REPAIR (MVR) (Right) INTRAOPERATIVE TRANSESOPHAGEAL ECHOCARDIOGRAM (N/A) Subjective: Went into a fib with a slow ventricular response overnight No complaints this AM Walked around unit this AM  Objective: Vital signs in last 24 hours: Temp:  [98.1 F (36.7 C)-99.7 F (37.6 C)] 99.7 F (37.6 C) (05/02 0740) Pulse Rate:  [67-70] 70 (05/02 0700) Cardiac Rhythm:  [-] A-V Sequential paced (05/02 0400) Resp:  [6-23] 17 (05/02 0700) BP: (81-144)/(38-69) 112/54 mmHg (05/02 0700) SpO2:  [94 %-100 %] 99 % (05/02 0700) Weight:  [158 lb 8.2 oz (71.9 kg)] 158 lb 8.2 oz (71.9 kg) (05/02 0500)  Hemodynamic parameters for last 24 hours:    Intake/Output from previous day: 05/01 0701 - 05/02 0700 In: 1219.8 [P.O.:700; I.V.:269.8; IV Piggyback:250] Out: 4095 [Urine:3675; Chest Tube:420] Intake/Output this shift:    General appearance: alert, cooperative and no distress Neurologic: intact Heart: regular rate and rhythm Lungs: diminished breath sounds bibasilar R>L  Lab Results:  Recent Labs  04/27/14 1500 04/27/14 1533 04/28/14 0349  WBC 10.5  --  8.2  HGB 9.0* 9.9* 8.0*  HCT 27.3* 29.0* 24.6*  PLT 76*  --  66*   BMET:  Recent Labs  04/27/14 0325  04/27/14 1533 04/28/14 0349  NA 142  --  141 139  K 4.5  --  3.7 4.0  CL 108  --  98 101  CO2 22  --   --  29  GLUCOSE 141*  --  174* 109*  BUN 7  --  8 13  CREATININE 0.82  < > 0.90 0.96  CALCIUM 8.4  --   --  8.3*  < > = values in this interval not displayed.  PT/INR:  Recent Labs  04/28/14 0349  LABPROT 16.8*  INR 1.40   ABG    Component Value Date/Time   PHART 7.362 04/27/2014 1101   HCO3 26.1* 04/27/2014 1101   TCO2 28 04/27/2014 1533   ACIDBASEDEF 1.0 04/27/2014 0820   O2SAT 99.0 04/27/2014 1101   CBG (last 3)   Recent Labs  04/27/14 1951 04/27/14 2317 04/28/14 0400  GLUCAP 120* 105* 95    Assessment/Plan: S/P Procedure(s) (LRB): MINIMALLY  INVASIVE MITRAL VALVE REPAIR (MVR) (Right) INTRAOPERATIVE TRANSESOPHAGEAL ECHOCARDIOGRAM (N/A) Plan for transfer to step-down: see transfer orders CV- currently in junctional rhythm  In the 60s  Will a pace at 70 with a long PR  Will dc amiodarone and see if he has any further a fib  RESP- CXR shows opacity in RUL- IS  RENAL- lytes, creatinine OK  Diuresed well yesterday  Will start PO lasix  ENDO- CBG OK-   Thrombocytopenia- no signs of bleeding- follow   LOS: 2 days    Loreli Slot 04/28/2014

## 2014-04-29 ENCOUNTER — Inpatient Hospital Stay (HOSPITAL_COMMUNITY): Payer: BC Managed Care – PPO

## 2014-04-29 LAB — CBC
HCT: 23.6 % — ABNORMAL LOW (ref 39.0–52.0)
Hemoglobin: 7.7 g/dL — ABNORMAL LOW (ref 13.0–17.0)
MCH: 31 pg (ref 26.0–34.0)
MCHC: 32.6 g/dL (ref 30.0–36.0)
MCV: 95.2 fL (ref 78.0–100.0)
Platelets: 61 10*3/uL — ABNORMAL LOW (ref 150–400)
RBC: 2.48 MIL/uL — ABNORMAL LOW (ref 4.22–5.81)
RDW: 12.4 % (ref 11.5–15.5)
WBC: 6.6 10*3/uL (ref 4.0–10.5)

## 2014-04-29 LAB — BASIC METABOLIC PANEL
BUN: 15 mg/dL (ref 6–23)
CO2: 29 mEq/L (ref 19–32)
Calcium: 8.2 mg/dL — ABNORMAL LOW (ref 8.4–10.5)
Chloride: 99 mEq/L (ref 96–112)
Creatinine, Ser: 0.9 mg/dL (ref 0.50–1.35)
GFR calc Af Amer: >90 mL/min (ref >90)
GFR calc non Af Amer: >90 mL/min (ref >90)
Glucose, Bld: 110 mg/dL — ABNORMAL HIGH (ref 70–99)
Potassium: 4.4 mEq/L (ref 3.7–5.3)
Sodium: 137 mEq/L (ref 137–147)

## 2014-04-29 LAB — PROTIME-INR
INR: 1.25 (ref 0.00–1.49)
Prothrombin Time: 15.4 seconds — ABNORMAL HIGH (ref 11.6–15.2)

## 2014-04-29 NOTE — Progress Notes (Signed)
3 Days Post-Op Procedure(s) (LRB): MINIMALLY INVASIVE MITRAL VALVE REPAIR (MVR) (Right) INTRAOPERATIVE TRANSESOPHAGEAL ECHOCARDIOGRAM (N/A) Subjective: Some incisional pain small amount of emesis after coughing earlier  Objective: Vital signs in last 24 hours: Temp:  [99.3 F (37.4 C)-101.5 F (38.6 C)] 99.6 F (37.6 C) (05/03 0404) Pulse Rate:  [50-70] 52 (05/03 0700) Cardiac Rhythm:  [-] Heart block (05/02 2300) Resp:  [13-24] 21 (05/03 0700) BP: (92-115)/(38-54) 106/54 mmHg (05/03 0700) SpO2:  [90 %-100 %] 99 % (05/03 0700) Weight:  [158 lb 8.2 oz (71.9 kg)] 158 lb 8.2 oz (71.9 kg) (05/03 0600)  Hemodynamic parameters for last 24 hours:    Intake/Output from previous day: 05/02 0701 - 05/03 0700 In: 1066 [P.O.:960; I.V.:56; IV Piggyback:50] Out: 370 [Urine:330; Chest Tube:40] Intake/Output this shift:    General appearance: alert and no distress Neurologic: intact Heart: regular rate and rhythm Lungs: diminished breath sounds bibasilar Abdomen: normal findings: soft, non-tender  Lab Results:  Recent Labs  04/28/14 0349 04/29/14 0321  WBC 8.2 6.6  HGB 8.0* 7.7*  HCT 24.6* 23.6*  PLT 66* 61*   BMET:  Recent Labs  04/28/14 0349 04/29/14 0321  NA 139 137  K 4.0 4.4  CL 101 99  CO2 29 29  GLUCOSE 109* 110*  BUN 13 15  CREATININE 0.96 0.90  CALCIUM 8.3* 8.2*    PT/INR:  Recent Labs  04/29/14 0321  LABPROT 15.4*  INR 1.25   ABG    Component Value Date/Time   PHART 7.362 04/27/2014 1101   HCO3 26.1* 04/27/2014 1101   TCO2 28 04/27/2014 1533   ACIDBASEDEF 1.0 04/27/2014 0820   O2SAT 99.0 04/27/2014 1101   CBG (last 3)   Recent Labs  04/27/14 2317 04/28/14 0400 04/28/14 0738  GLUCAP 105* 95 111*    Assessment/Plan: S/P Procedure(s) (LRB): MINIMALLY INVASIVE MITRAL VALVE REPAIR (MVR) (Right) INTRAOPERATIVE TRANSESOPHAGEAL ECHOCARDIOGRAM (N/A) Plan for transfer to step-down: see transfer orders  CV- remains in junctional rhythm- amiodarone  given briefly for questionable a fib, but stopped yesterday morning, not receiving a beta blocker  Tolerating junctional rhythm well- follow  RESP- CXR shows improvement in RUL opacity  RENAL- lytes, creatinine OK, at preop weight  ENDO- CBG well controlled  Ambulating well  Transfer to 2000   LOS: 3 days    Loreli Slot 04/29/2014

## 2014-04-30 ENCOUNTER — Inpatient Hospital Stay (HOSPITAL_COMMUNITY): Payer: BC Managed Care – PPO

## 2014-04-30 LAB — CBC
HCT: 26.8 % — ABNORMAL LOW (ref 39.0–52.0)
Hemoglobin: 8.6 g/dL — ABNORMAL LOW (ref 13.0–17.0)
MCH: 30.7 pg (ref 26.0–34.0)
MCHC: 32.1 g/dL (ref 30.0–36.0)
MCV: 95.7 fL (ref 78.0–100.0)
Platelets: 95 10*3/uL — ABNORMAL LOW (ref 150–400)
RBC: 2.8 MIL/uL — ABNORMAL LOW (ref 4.22–5.81)
RDW: 12.2 % (ref 11.5–15.5)
WBC: 8.9 10*3/uL (ref 4.0–10.5)

## 2014-04-30 LAB — URINALYSIS, ROUTINE W REFLEX MICROSCOPIC
Glucose, UA: NEGATIVE mg/dL
Hgb urine dipstick: NEGATIVE
Ketones, ur: NEGATIVE mg/dL
Leukocytes, UA: NEGATIVE
Nitrite: NEGATIVE
Protein, ur: 30 mg/dL — AB
Specific Gravity, Urine: 1.023 (ref 1.005–1.030)
Urobilinogen, UA: >8 mg/dL — ABNORMAL HIGH (ref 0.0–1.0)
pH: 7 (ref 5.0–8.0)

## 2014-04-30 LAB — BASIC METABOLIC PANEL
BUN: 11 mg/dL (ref 6–23)
CO2: 30 mEq/L (ref 19–32)
Calcium: 8.7 mg/dL (ref 8.4–10.5)
Chloride: 101 mEq/L (ref 96–112)
Creatinine, Ser: 0.86 mg/dL (ref 0.50–1.35)
GFR calc Af Amer: >90 mL/min (ref >90)
GFR calc non Af Amer: >90 mL/min (ref >90)
Glucose, Bld: 109 mg/dL — ABNORMAL HIGH (ref 70–99)
Potassium: 4.4 mEq/L (ref 3.7–5.3)
Sodium: 140 mEq/L (ref 137–147)

## 2014-04-30 LAB — PROTIME-INR
INR: 0.99 (ref 0.00–1.49)
Prothrombin Time: 12.9 seconds (ref 11.6–15.2)

## 2014-04-30 LAB — URINE MICROSCOPIC-ADD ON

## 2014-04-30 LAB — TISSUE CULTURE
Culture: NO GROWTH
Gram Stain: NONE SEEN

## 2014-04-30 LAB — GLUCOSE, CAPILLARY: Glucose-Capillary: <10 mg/dL — CL (ref 70–99)

## 2014-04-30 MED ORDER — COUMADIN BOOK
Freq: Once | Status: DC
  Filled 2014-04-30: qty 1

## 2014-04-30 MED ORDER — WARFARIN SODIUM 5 MG PO TABS
5.0000 mg | ORAL_TABLET | Freq: Every day | ORAL | Status: DC
  Administered 2014-04-30 – 2014-05-02 (×3): 5 mg via ORAL
  Filled 2014-04-30 (×4): qty 1

## 2014-04-30 MED ORDER — WARFARIN VIDEO: Freq: Once | Status: DC

## 2014-04-30 MED FILL — Potassium Chloride Inj 2 mEq/ML: INTRAVENOUS | Qty: 40 | Status: AC

## 2014-04-30 MED FILL — Vancomycin HCl For IV Soln 10 GM (Base Equivalent): INTRAVENOUS | Qty: 1 | Status: AC

## 2014-04-30 MED FILL — Magnesium Sulfate Inj 50%: INTRAMUSCULAR | Qty: 10 | Status: AC

## 2014-04-30 MED FILL — Heparin Sodium (Porcine) Inj 1000 Unit/ML: INTRAMUSCULAR | Qty: 30 | Status: AC

## 2014-04-30 NOTE — Progress Notes (Signed)
Pt ambulated 42ft on 4 liters of oxygen pushing a wheelchair x1 assist. Pt tolerated walk well and oxygen sats were 94% after ambulation on 4L. Pt back in bed. Will continue to monitor.

## 2014-04-30 NOTE — Discharge Instructions (Addendum)
Thoracotomy, Care After  Refer to this sheet in the next few weeks. These instructions provide you with information on caring for yourself after your procedure. Your health care provider may also give you more specific instructions. Your treatment has been planned according to current medical practices, but problems sometimes occur. Call your health care provider if you have any problems or questions after your procedure.  WHAT TO EXPECT AFTER YOUR PROCEDURE  After your procedure, it is typical to have the following sensations:  You may feel pain at the incision site.  You may be constipated from the pain medicine given and the change in your level of activity.  You may feel extremely tired. HOME CARE INSTRUCTIONS  Take over-the-counter or prescription medicines for pain, discomfort, or fever only as directed by your health care provider. It is very important to take pain relieving medicine before your pain becomes severe. You will be able to breathe and cough more comfortably if your pain is well controlled.  Take deep breaths. Deep breathing helps to keep your lungs inflated and protects against a lung infection (pneumonia).  Cough frequently. Even though coughing may cause discomfort, coughing is important to clear mucus (phlegm) and expand your lungs. Coughing helps prevent pneumonia. If it hurts to cough, hold a pillow against your chest when you cough. This may help with the discomfort.  Continue to use an incentive spirometer as directed. The use of an incentive spirometer helps to keep your lungs inflated and protects against pneumonia.  Change the bandages over your incision as needed or as directed by your health care provider.  Remove the bandages over your chest tube site as directed by your health care provider.  Resume your normal diet as directed. It is important to have adequate protein, calories, vitamins, and minerals to promote healing.  Prevent constipation.  Eat high-fiber foods  such as whole grain cereals and breads, brown rice, beans, and fresh fruits and vegetables.  Drink enough water and fluids to keep your urine clear or pale yellow. Avoid drinking beverages containing caffeine. Beverages containing caffeine can cause dehydration and harden your stool.  Talk to your health care provider about taking a stool softener or laxative.  Avoid lifting or driving until you are instructed otherwise.  Shower rather than bathe until you see your health care provider, or as instructed. Gently wash the area of your incision with water and soap as directed. Do not use anything else to clean your incision except as directed by your health care provider.  Do not smoke or use tobacco products.  Avoid secondhand smoke.  Schedule an appointment for stitch (suture) or staple removal as directed.  Schedule and attend all follow-up visits as directed by your health care provider. It is important to keep all your appointments.  Participate in pulmonary rehabilitation as directed by your health care provider.  Do not travel by airplane for 2 weeks after your chest tube is removed. SEEK MEDICAL CARE IF:  You are bleeding from your wounds.  Your heartbeat seems irregular.  You have redness, swelling, or increasing pain in the wounds.  There is pus coming from your wounds.  There is a bad smell coming from the wound or dressing.  You have a fever or chills.  You have nausea or are vomiting.  You have muscle aches. SEEK IMMEDIATE MEDICAL CARE IF:  You have a rash.  You have difficulty breathing.  You have a reaction or side effect to medicines given.  You  have persistent nausea.  You have lightheadedness or feel faint.  You have shortness of breath or chest pain.  You have persistent pain. Document Released: 05/29/2011 Document Revised: 10/04/2013 Document Reviewed: 08/02/2013  [facility] Patient Information 2014 [employer], [place].      Information on my medicine - Coumadin    (Warfarin)  This medication education was reviewed with me or my healthcare representative as part of my discharge preparation.  The pharmacist that spoke with me during my hospital stay was:  Hilario Quarryaron George Amend, [facility]  Why was Coumadin prescribed for you? Coumadin was prescribed for you because you have a blood clot or a medical condition that can cause an increased risk of forming blood clots. Blood clots can cause serious health problems by blocking the flow of blood to the heart, lung, or brain. Coumadin can prevent harmful blood clots from forming. As a reminder your indication for Coumadin is:   Blood Clot Prevention After Heart Valve Surgery  What test will check on my response to Coumadin? While on Coumadin (warfarin) you will need to have an INR test regularly to ensure that your dose is keeping you in the desired range. The INR (international normalized ratio) number is calculated from the result of the laboratory test called prothrombin time (PT).  If an INR APPOINTMENT HAS NOT ALREADY BEEN MADE FOR YOU please schedule an appointment to have this lab work done by your health care provider within 7 days. Your INR goal is usually a number between:  2 to 3 or your provider may give you a more narrow range like 2-2.5.  Ask your health care provider during an office visit what your goal INR is.  What  do you need to  know  About  COUMADIN? Take Coumadin (warfarin) exactly as prescribed by your healthcare provider about the same time each day.  DO NOT stop taking without talking to the doctor who prescribed the medication.  Stopping without other blood clot prevention medication to take the place of Coumadin may increase your risk of developing a new clot or stroke.  Get refills before you run out.  What do you do if you miss a dose? If you miss a dose, take it as soon as you remember on the same day then continue your regularly scheduled regimen the next day.  Do not take two doses of Coumadin  at the same time.  Important Safety Information A possible side effect of Coumadin (Warfarin) is an increased risk of bleeding. You should call your healthcare provider right away if you experience any of the following:   Bleeding from an injury or your nose that does not stop.   Unusual colored urine (red or dark brown) or unusual colored stools (red or black).   Unusual bruising for unknown reasons.   A serious fall or if you hit your head (even if there is no bleeding).  Some foods or medicines interact with Coumadin (warfarin) and might alter your response to warfarin. To help avoid this:   Eat a balanced diet, maintaining a consistent amount of Vitamin K.   Notify your provider about major diet changes you plan to make.   Avoid alcohol or limit your intake to 1 drink for women and 2 drinks for men per day. (1 drink is 5 oz. wine, 12 oz. beer, or 1.5 oz. liquor.)  Make sure that ANY health care provider who prescribes medication for you knows that you are taking Coumadin (warfarin).  Also make sure  the healthcare provider who is monitoring your Coumadin knows when you have started a new medication including herbals and non-prescription products.  Coumadin (Warfarin)  Major Drug Interactions  Increased Warfarin Effect Decreased Warfarin Effect  Alcohol (large quantities) Antibiotics (esp. Septra/Bactrim, Flagyl, Cipro) Amiodarone (Cordarone) Aspirin (ASA) Cimetidine (Tagamet) Megestrol (Megace) NSAIDs (ibuprofen, naproxen, etc.) Piroxicam (Feldene) Propafenone (Rythmol SR) Propranolol (Inderal) Isoniazid (INH) Posaconazole (Noxafil) Barbiturates (Phenobarbital) Carbamazepine (Tegretol) Chlordiazepoxide (Librium) Cholestyramine (Questran) Griseofulvin Oral Contraceptives Rifampin Sucralfate (Carafate) Vitamin K   Coumadin (Warfarin) Major Herbal Interactions  Increased Warfarin Effect Decreased Warfarin Effect  Garlic Ginseng Ginkgo biloba Coenzyme Q10 Green tea [place].  Johns wort    Coumadin (Warfarin) FOOD Interactions  Eat a consistent number of servings per week of foods HIGH in Vitamin K (1 serving =  cup)  Collards (cooked, or boiled & drained) Kale (cooked, or boiled & drained) Mustard greens (cooked, or boiled & drained) Parsley *serving size only =  cup Spinach (cooked, or boiled & drained) Swiss chard (cooked, or boiled & drained) Turnip greens (cooked, or boiled & drained)  Eat a consistent number of servings per week of foods MEDIUM-HIGH in Vitamin K (1 serving = 1 cup)  Asparagus (cooked, or boiled & drained) Broccoli (cooked, boiled & drained, or raw & chopped) Brussel sprouts (cooked, or boiled & drained) *serving size only =  cup Lettuce, raw (green leaf, endive, romaine) Spinach, raw Turnip greens, raw & chopped   These websites have more information on Coumadin (warfarin):  http://www.king-russell.com/; https://www.hines.net/;

## 2014-04-30 NOTE — Progress Notes (Signed)
Pt's HR sustaining below 50 in the 40s. External Pacemaker initiated  per orders (rate - 70 mA -10). Pt asymptomatic, will continue to monitor.

## 2014-04-30 NOTE — Progress Notes (Signed)
CARDIAC REHAB PHASE I   PRE:  Rate/Rhythm: 58 Junctional  BP:  Sitting: 126/54      SaO2: 86 RA/96 2 liters  MODE:  Ambulation: 700 ft   POST:  Rate/Rhythm: 63 Junctional  BP:  Sitting: 123/51     SaO2: 93 2L 1100-1138 Pt on 4 liters [place] upon arrival to room. O2 removed to determine oxygen requirements at home at discharge. Sats dropped to 86% on RA. Recovered to 96% with 2 liters prior to ambulation. Patient ambulated with RW independently on 2 L O2. Steady gait. Denied complaints. Intermittant sat checks 93% while ambulatory. Post ambulation pt returned to recliner. Call bell and phone in reach. RN re-educated pt on correct use of IS and importance of use 10 x per hour while awake. Pt demonstrated x 10 breaths.   Bennett Ram English PayneRN, BSN 04/30/2014 11:45 AM

## 2014-04-30 NOTE — Progress Notes (Signed)
EPWs pulled per protocol, no ectopy or other problems noted at this time.  Will continue to monitor per protocol.

## 2014-04-30 NOTE — Progress Notes (Addendum)
CCMD notified RN that pt had 6 beat run of V-tach. Pt was up to the bathroom. Pt asymptomatic with stable vital signs and a HR of 57. Strip saved into CHL. Pt still in junctional rhythm. Pt back in bed. Will continue to monitor.

## 2014-04-30 NOTE — Progress Notes (Addendum)
[address]       [place] [id number]             [phone]      4 Days Post-Op  Procedure(s) (LRB): MINIMALLY INVASIVE MITRAL VALVE REPAIR (MVR) (Right) INTRAOPERATIVE TRANSESOPHAGEAL ECHOCARDIOGRAM (N/A) Subjective: Feels ok, no new complaints  Objective  Telemetry brady to 30's, a-paced currently  Temp:  [99.6 F (37.6 C)-100.7 F (38.2 C)] 100.7 F (38.2 C) (05/04 0634) Pulse Rate:  [51-60] 56 (05/04 0352) Resp:  [19-29] 24 (05/04 0352) BP: (97-134)/(49-69) 134/69 mmHg (05/04 0352) SpO2:  [89 %-100 %] 92 % (05/04 91470638) Weight:  [156 lb 8 oz (70.988 kg)] 156 lb 8 oz (70.988 kg) (05/04 0352)   Intake/Output Summary (Last 24 hours) at 04/30/14 0731 Last data filed at 04/30/14 0353  Gross per 24 hour  Intake    300 ml  Output   1600 ml  Net  -1300 ml       General appearance: alert, cooperative and no distress Heart: regular rate and rhythm Lungs: clear to auscultation bilaterally Abdomen: benign Extremities: no edema Wound: incis healing well  Lab Results:  Recent Labs  04/27/14 1500  04/29/14 0321 04/30/14 0555  NA  --   < > 137 140  K  --   < > 4.4 4.4  CL  --   < > 99 101  CO2  --   < > 29 30  GLUCOSE  --   < > 110* 109*  BUN  --   < > 15 11  CREATININE 0.87  < > 0.90 0.86  CALCIUM  --   < > 8.2* 8.7  MG 2.0  --   --   --   < > = values in this interval not displayed. No results found for this basename: AST, ALT, ALKPHOS, BILITOT, PROT, ALBUMIN,  in the last 72 hours No results found for this basename: LIPASE, AMYLASE,  in the last 72 hours  Recent Labs  04/29/14 0321 04/30/14 0555  WBC 6.6 8.9  HGB 7.7* 8.6*  HCT 23.6* 26.8*  MCV 95.2 95.7  PLT 61* 95*   No results found for this basename: CKTOTAL, CKMB, TROPONINI,  in the last 72 hours No components found with this basename: POCBNP,  No results found for this basename: DDIMER,  in the last 72 hours No results found for this basename: HGBA1C,  in the last 72  hours No results found for this basename: CHOL, HDL, LDLCALC, TRIG, CHOLHDL,  in the last 72 hours No results found for this basename: TSH, T4TOTAL, FREET3, T3FREE, THYROIDAB,  in the last 72 hours No results found for this basename: VITAMINB12, FOLATE, FERRITIN, TIBC, IRON, RETICCTPCT,  in the last 72 hours  Medications: Scheduled . acetaminophen  1,000 mg Oral 4 times per day  . aspirin EC  81 mg Oral Daily  . bisacodyl  10 mg Oral Daily   Or  . bisacodyl  10 mg Rectal Daily  . docusate sodium  200 mg Oral Daily  . furosemide  40 mg Oral Daily  . pantoprazole  40 mg Oral Daily  . potassium chloride  20 mEq Oral BID  . sodium chloride  3 mL Intravenous Q12H  . warfarin  2.5 mg Oral q1800  . Warfarin - Physician Dosing Inpatient   Does not apply q1800     Radiology/Studies:  Dg Chest 2 View  04/30/2014   CLINICAL DATA:  Status post  mitral valve repair. Shortness of breath.  EXAM: CHEST  2 VIEW  COMPARISON:  04/29/2014  FINDINGS: Left IJ sheath has been removed.  Unchanged cardiopericardial enlargement with mitral valve device in stable orientation.  Asymmetric interstitial and airspace disease on the right, especially the upper lobe. This pattern can be seen with mitral valve dysfunction. The opacification is less dense than previous. Small bilateral pleural effusions. No evidence of air leak - minimal subcutaneous gas at the left neck base is likely related to removed catheter.  IMPRESSION: 1. Improved asymmetric right pulmonary edema. 2. Small bilateral pleural effusions without interval increase.   Electronically Signed   By: Tiburcio Pea M.D.   On: 04/30/2014 06:57   Dg Chest Port 1 View  04/29/2014   CLINICAL DATA:  Post mitral valve replacement  EXAM: PORTABLE CHEST - 1 VIEW  COMPARISON:  DG CHEST 1V PORT dated 04/28/2014; DG CHEST 2 VIEW dated 04/23/2014; DG CHEST 2 VIEW dated 09/28/2012; CHEST-1V dated 12/24/2008  FINDINGS: Grossly unchanged enlarged cardiac silhouette and mediastinal  contours post mitral valve replacement. Worsening asymmetric heterogeneous airspace opacities within the peripheral aspects of the right upper and mid lung. Lung volumes remain reduced with slight worsening of bibasilar heterogeneous opacities. Unchanged trace right-sided pleural effusion. No pneumothorax. Unchanged bones.  IMPRESSION: 1. Findings suggestive of worsening of asymmetric right-sided pulmonary edema following mitral valve replacement/repair, though note underlying infection is not excluded. Continued attention on follow-up recommended. 2. Decreased lung volumes with worsening bibasilar opacities, likely atelectasis.   Electronically Signed   By: Simonne Come M.D.   On: 04/29/2014 09:21    INR:0.9 Will add last result for INR, ABG once components are confirmed Will add last 4 CBG results once components are confirmed  Assessment/Plan: S/P Procedure(s) (LRB): MINIMALLY INVASIVE MITRAL VALVE REPAIR (MVR) (Right) INTRAOPERATIVE TRANSESOPHAGEAL ECHOCARDIOGRAM (N/A)  1 doing well, cont to a-pace and observe rhythm closely 2 push pulm toilet and wean O2, CXR- aeration conts to improve. Low grade temp- no leukocytosis.  3 labs stable, HCT improved. INR down- will increase coumadin dose      LOS: 4 days    Rowe Clack 5/4/20157:31 AM  I have seen and examined the patient and agree with the assessment and plan as outlined.  Doing very well.  Rhythm stable w/ pacer off HR 50-60 and no symptoms.  Tentatively plan d/c home tomorrow if he remains stable.  Purcell Nails 04/30/2014 9:35 AM

## 2014-05-01 LAB — CBC WITH DIFFERENTIAL/PLATELET
Basophils Absolute: 0 10*3/uL (ref 0.0–0.1)
Basophils Relative: 0 % (ref 0–1)
Eosinophils Absolute: 0.1 10*3/uL (ref 0.0–0.7)
Eosinophils Relative: 2 % (ref 0–5)
HCT: 25.5 % — ABNORMAL LOW (ref 39.0–52.0)
Hemoglobin: 8.2 g/dL — ABNORMAL LOW (ref 13.0–17.0)
Lymphocytes Relative: 19 % (ref 12–46)
Lymphs Abs: 1.2 10*3/uL (ref 0.7–4.0)
MCH: 30.8 pg (ref 26.0–34.0)
MCHC: 32.2 g/dL (ref 30.0–36.0)
MCV: 95.9 fL (ref 78.0–100.0)
Monocytes Absolute: 0.7 10*3/uL (ref 0.1–1.0)
Monocytes Relative: 11 % (ref 3–12)
Neutro Abs: 4.4 10*3/uL (ref 1.7–7.7)
Neutrophils Relative %: 68 % (ref 43–77)
Platelets: 130 10*3/uL — ABNORMAL LOW (ref 150–400)
RBC: 2.66 MIL/uL — ABNORMAL LOW (ref 4.22–5.81)
RDW: 12.3 % (ref 11.5–15.5)
WBC: 6.5 10*3/uL (ref 4.0–10.5)

## 2014-05-01 LAB — PROTIME-INR
INR: 1.3 (ref 0.00–1.49)
Prothrombin Time: 15.9 seconds — ABNORMAL HIGH (ref 11.6–15.2)

## 2014-05-01 MED ORDER — LEVOFLOXACIN 750 MG PO TABS
750.0000 mg | ORAL_TABLET | Freq: Every day | ORAL | Status: DC
  Administered 2014-05-01 – 2014-05-02 (×2): 750 mg via ORAL
  Filled 2014-05-01 (×3): qty 1

## 2014-05-01 NOTE — Progress Notes (Signed)
Pt 2 right chest tube sutures draining serosanguinous drainage through bandage onto bed pad.  RN changed with Vaseline gauze and 4x4 gauze.  Pt had no complains of pain or discomfort during dressing change.  Bandage marked, will share with day shift RN.  Pt resting in bed, with call light in reach, RN will continue to monitor.   Thane Edu, RN

## 2014-05-01 NOTE — Progress Notes (Signed)
Pt ambulated in hallway 650 ft with RN on 2L [place] with front wheeled walker.  Pt tolerated well with no rests, complaints of pain or shortness of breath.  Pt returned to bed, O2 at 98 heart rate 70. Call light in reach, RN will continue to monitor.   Thane Edu, RN

## 2014-05-01 NOTE — Care Management Note (Signed)
    Page 1 of 1   05/03/2014     4:02:46 PM CARE MANAGEMENT NOTE 05/03/2014  Patient:  Bradley Hansen, Bradley Hansen   Account Number:  [id number]  Date Initiated:  05/01/2014  Documentation initiated by:  Domanick Cuccia  Subjective/Objective Assessment:   Pt adm on 04/26/14 s/p mini MVR.  PTA, pt resides at home with son and cousin and is independent.     Action/Plan:   Will follow for dc needs as pt progresses.   Anticipated DC Date:  05/02/2014   Anticipated DC Plan:  HOME/SELF CARE      DC Planning Services  CM consult      Choice offered to / List presented to:             Status of service:  Completed, signed off Medicare Important Message given?   (If response is "NO", the following Medicare IM given date fields will be blank) Date Medicare IM given:   Date Additional Medicare IM given:    Discharge Disposition:  HOME/SELF CARE  Per UR Regulation:  Reviewed for med. necessity/level of care/duration of stay  If discussed at Long Length of Stay Meetings, dates discussed:   05/03/2014    Comments:  05/03/14 Sidney Ace, RN, BSN [phone] pt for dc home today with family; no home needs identified.

## 2014-05-01 NOTE — Progress Notes (Signed)
CARDIAC REHAB PHASE I   PRE:  Rate/Rhythm: 61 SR  BP:  Sitting: 125/53      SaO2: 100% 1L/96 RA  MODE:  Ambulation: 750 ft   POST:  Rate/Rhythm: 62 SR  BP:  Sitting: 132/55     SaO2: 93 RA  SATURATION QUALIFICATIONS: (This note is used to comply with regulatory documentation for home oxygen)  Patient Saturations on Room Air at Rest = 96%  Patient Saturations on Room Air while Ambulating = 86%  Patient Saturations on 2 Liters of oxygen while Ambulating = 94%  Please briefly explain why patient needs home oxygen: Desaturation with ambulation  1130-1200 See pulmonary note above for oxygenation. Ambulated with RW unassisted. Steady gait noted. Denied complaints. Encouraged patient to take deep breaths and to purse lip breath during walk. Pt refuses to speak, and unable to breath thru mouth r/t chronic holding of saliva. Pt states he never swallows saliva and "spits" frequently. Post ambulation, pt to chair with call bell in reach. Pt willing to deep breath once he cleared his mouth from saliva.   Maybree Riling English PayneRN, BSN 05/01/2014 12:06 PM

## 2014-05-01 NOTE — Progress Notes (Signed)
05/01/2014 2:08 PM Nursing note Education provided to patient on proper use of incentive spirometer. Pt. Able to perform return demonstration. Hourly use during waking hours encouraged. Pt. Voiced understanding. Will continue to monitor patient.  Blanchard Kelch Adisyn Ruscitti

## 2014-05-01 NOTE — Progress Notes (Signed)
05/01/2014 4:40 PM Nursing note Pt. Ambulated 500 ft with RW, RN and on 2L o2 [place]. Pt. Tolerated well. Encouraged one more walk this evening.  Blanchard Kelch Kimya Mccahill

## 2014-05-01 NOTE — Progress Notes (Addendum)
[address]       [place] [id number]             [phone]      5 Days Post-Op  Procedure(s) (LRB): MINIMALLY INVASIVE MITRAL VALVE REPAIR (MVR) (Right) INTRAOPERATIVE TRANSESOPHAGEAL ECHOCARDIOGRAM (N/A) Subjective: Feels fair, increased cough, + Sputum  Objective  Telemetry sinus brady/sinus rhythm  Temp:  [99.5 F (37.5 C)-101.2 F (38.4 C)] 99.5 F (37.5 C) (05/05 0357) Pulse Rate:  [57-63] 57 (05/05 0357) Resp:  [18-20] 18 (05/05 0357) BP: (111-144)/(50-62) 126/62 mmHg (05/05 0357) SpO2:  [92 %-97 %] 96 % (05/05 0357) Weight:  [154 lb 15.7 oz (70.3 kg)] 154 lb 15.7 oz (70.3 kg) (05/05 0500)   Intake/Output Summary (Last 24 hours) at 05/01/14 0730 Last data filed at 05/01/14 0516  Gross per 24 hour  Intake    240 ml  Output    400 ml  Net   -160 ml       General appearance: alert, cooperative and no distress Heart: regular rate and rhythm Lungs: sl coarse in bases Abdomen: benign Extremities: no edema Wound: incis healing well  Lab Results:  Recent Labs  04/29/14 0321 04/30/14 0555  NA 137 140  K 4.4 4.4  CL 99 101  CO2 29 30  GLUCOSE 110* 109*  BUN 15 11  CREATININE 0.90 0.86  CALCIUM 8.2* 8.7   No results found for this basename: AST, ALT, ALKPHOS, BILITOT, PROT, ALBUMIN,  in the last 72 hours No results found for this basename: LIPASE, AMYLASE,  in the last 72 hours  Recent Labs  04/30/14 0555 05/01/14 0525  WBC 8.9 6.5  NEUTROABS  --  4.4  HGB 8.6* 8.2*  HCT 26.8* 25.5*  MCV 95.7 95.9  PLT 95* 130*   No results found for this basename: CKTOTAL, CKMB, TROPONINI,  in the last 72 hours No components found with this basename: POCBNP,  No results found for this basename: DDIMER,  in the last 72 hours No results found for this basename: HGBA1C,  in the last 72 hours No results found for this basename: CHOL, HDL, LDLCALC, TRIG, CHOLHDL,  in the last 72 hours No results found for this basename: TSH, T4TOTAL,  FREET3, T3FREE, THYROIDAB,  in the last 72 hours No results found for this basename: VITAMINB12, FOLATE, FERRITIN, TIBC, IRON, RETICCTPCT,  in the last 72 hours  Medications: Scheduled . acetaminophen  1,000 mg Oral 4 times per day  . aspirin EC  81 mg Oral Daily  . bisacodyl  10 mg Oral Daily   Or  . bisacodyl  10 mg Rectal Daily  . coumadin book   Does not apply Once  . docusate sodium  200 mg Oral Daily  . furosemide  40 mg Oral Daily  . pantoprazole  40 mg Oral Daily  . potassium chloride  20 mEq Oral BID  . sodium chloride  3 mL Intravenous Q12H  . warfarin  5 mg Oral q1800  . warfarin   Does not apply Once  . Warfarin - Physician Dosing Inpatient   Does not apply q1800     Radiology/Studies:  Dg Chest 2 View  04/30/2014   CLINICAL DATA:  Status post mitral valve repair. Shortness of breath.  EXAM: CHEST  2 VIEW  COMPARISON:  04/29/2014  FINDINGS: Left IJ sheath has been removed.  Unchanged cardiopericardial enlargement with mitral valve device in stable orientation.  Asymmetric interstitial and airspace disease on the  right, especially the upper lobe. This pattern can be seen with mitral valve dysfunction. The opacification is less dense than previous. Small bilateral pleural effusions. No evidence of air leak - minimal subcutaneous gas at the left neck base is likely related to removed catheter.  IMPRESSION: 1. Improved asymmetric right pulmonary edema. 2. Small bilateral pleural effusions without interval increase.   Electronically Signed   By: Tiburcio Pea M.D.   On: 04/30/2014 06:57    INR: Will add last result for INR, ABG once components are confirmed Will add last 4 CBG results once components are confirmed  Assessment/Plan: S/P Procedure(s) (LRB): MINIMALLY INVASIVE MITRAL VALVE REPAIR (MVR) (Right) INTRAOPERATIVE TRANSESOPHAGEAL ECHOCARDIOGRAM (N/A)  1 low grade fevers - prob pulm source, will add levoquin 2 push pulm toilet, wean O2 3 cont ac rx 4 hold on beta  blocker for now with braadycardia 5 gentle diuresis    LOS: 5 days    Wayne E Gold 5/5/20157:30 AM  I have seen and examined the patient and agree with the assessment and plan as outlined.  Will hold d/c and start oral Levaquin.  Mobilize.  Pulm toilet.  Purcell Nails 05/01/2014 8:20 AM

## 2014-05-01 NOTE — Discharge Summary (Signed)
[address]       [place] [id number]             [phone]              Discharge Summary  Name: Bradley Hansen DOB: 1951-12-20 63 y.o. MRN: [id number]   Admission Date: 04/26/2014 Discharge Date:     Admitting Diagnosis: Mitral valve prolapse Severe mitral regurgitation   Discharge Diagnosis:  Mitral valve prolapse Severe mitral regurgitation Postoperative junctional bradycardia Expected postoperative blood loss anemia Postoperative bronchitis  Past Medical History  Diagnosis Date  . Mitral valve prolapse     TEE  08/2010 severe MR, bileaflet MVP, EF 60%  . Hypercholesteremia   . Synovitis of hand 2011    right hand  . Leukopenia   . Rhinitis 2011    chronic   . Hepatitis C 2004    pt doesn't want treatment  . Fibromyalgia   . Prostatitis   . Prostadynia   . Mitral regurgitation   . MITRAL VALVE PROLAPSE 02/24/2007    Qualifier: Diagnosis of  By: [name], [name]    . GASTROESOPHAGEAL REFLUX, NO ESOPHAGITIS 02/24/2007    Qualifier: Diagnosis of  By: [name], [name]    . HEPATITIS C 02/24/2007    Qualifier: Diagnosis of  By: [name], [name]    . BACK PAIN, CHRONIC 12/11/2009    Qualifier: Diagnosis of  By: [name], [name]    . S/P minimally invasive mitral valve repair 04/26/2014    Complex valvuloplasty including artificial Gore-tex neocord placement x8, bovine pericardial patch augmentation of P3 portion of posterior leaflet and 36 mm Sorin Memo 3D ring annuloplasty via right mini thoracotomy     Procedures: MINIMALLY INVASIVE MITRAL VALVE REPAIR - 04/26/2014  Complex valvuloplasty including artificial Gore-tex neo-cord placement x8  Decalcification and bovine pericardial patch resuspension (augmentation) of P3 portion of posterior leaflet   Sorin Memo 3D Ring Annuloplasty (size 36mm)    HPI:  The patient is a 63 y.o. 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 Several years ago, he was noted to have a heart murmur on routine physical examination, and he has been followed intermittently by Dr. [name], Dr. [name], and more recently by Dr. [name]. Echocardiograms have demonstrated bileaflet prolapse of the mitral valve with moderate to severe mitral regurgitation. [name] saw him in consultation on 03/09/2011 (prior to [facility] - [place]). At that time, he remained entirely asymptomatic and he was able to demonstrate normal exercise tolerance on exercise stress echocardiogram. He was disinclined to consider surgery at that time and plans were made for continued medical therapy with close followup. He has been followed regularly with intermittent echocardiograms. He was seen recently by Dr. [name] and a repeat transthoracic echocardiogram was performed. This revealed severe, holosystolic mitral valve prolapse including the anterior leaflet, with moderated to severe regurgitation posteriorly.  Cardiac catheterization was performed and revealed no significant coronary artery disease.  [name] again saw the patient in follow up, and at this point, the patient was interested in proceeding with surgery.  It was felt that he would be a favorable candidate for a minimally invasive approach.  All risks, benefits and alternatives of surgery were explained in detail, and the patient agreed to proceed.     Hospital Course:  The patient was admitted  to [facility] on 04/26/2014. The patient was taken to the operating room and underwent the above procedure.    The patient was slowly weaned from the ventilator and extubated on postop day 1.  He initially had some junctional bradycardia and was AAI paced, then later developed atrial fibrillation with slow ventricular response.  He was started on Amiodarone, but reverted to junctional rhythm, so this was discontinued.  He has remained in a  junctional bradycardia, with heart rates in the 50-60s and is asymptomatic. He has not been started on a beta blocker due to his bradycardia.  He also developed low grade fevers with productive cough and increased oxygen requirements. Chest x-ray revealed right upper lobe airspace disease and opacification, although this was improved from previous films.  He was started empirically on antibiotics for a presumed tracheobronchitis, and treated with increased pulmonary toilet measures.   The postoperative course has otherwise been uneventful.  The patient is tolerating a regular diet and is ambulating with cardiac rehab.  Incisions are healing well.  Chest tubes have been removed without difficulty. There has been a mild postoperative blood loss anemia which has not required transfusion.  Overall, the patient is progressing well.  We anticipate discharge home in the next 24-48 hours provided he remains stable.     Recent vital signs:  Filed Vitals:   05/01/14 0357  BP: 126/62  Pulse: 57  Temp: 99.5 F (37.5 C)  Resp: 18    Recent laboratory studies:  CBC: Recent Labs  04/30/14 0555 05/01/14 0525  WBC 8.9 6.5  HGB 8.6* 8.2*  HCT 26.8* 25.5*  PLT 95* 130*   BMET:  Recent Labs  04/29/14 0321 04/30/14 0555  NA 137 140  K 4.4 4.4  CL 99 101  CO2 29 30  GLUCOSE 110* 109*  BUN 15 11  CREATININE 0.90 0.86  CALCIUM 8.2* 8.7    PT/INR:  Recent Labs  05/01/14 0525  LABPROT 15.9*  INR 1.30     Discharge Medications:     Medication List    STOP taking these medications       amiodarone 200 MG tablet  Commonly known as:  PACERONE     sulfamethoxazole-trimethoprim 800-160 MG per tablet  Commonly known as:  BACTRIM DS      TAKE these medications       aspirin 81 MG EC tablet  Take 1 tablet (81 mg total) by mouth daily.     esomeprazole 40 MG capsule  Commonly known as:  NEXIUM  Take 40 mg by mouth daily as needed (reflux).     furosemide 40 MG tablet  Commonly  known as:  LASIX  Take 1 tablet (40 mg total) by mouth daily.     levofloxacin 750 MG tablet  Commonly known as:  LEVAQUIN  Take 1 tablet (750 mg total) by mouth daily.     metoprolol tartrate 12.5 mg Tabs tablet  Commonly known as:  LOPRESSOR  Take 0.5 tablets (12.5 mg total) by mouth 2 (two) times daily.     oxyCODONE 5 MG immediate release tablet  Commonly known as:  Oxy IR/ROXICODONE  Take 1-2 tablets (5-10 mg total) by mouth every 4 (four) hours as needed for moderate pain.     potassium chloride SA 20 MEQ tablet  Commonly known as:  K-DUR,KLOR-CON  Take 1 tablet (20 mEq total) by mouth daily.     warfarin 2.5 MG tablet  Commonly known as:  COUMADIN  Take 1  tablet (2.5 mg total) by mouth daily at 6 PM.        Discharge Instructions:  The patient is to refrain from driving, heavy lifting or strenuous activity.  May shower daily and clean incisions with soap and water.  May resume regular diet.   Follow Up: Follow-up Information   Follow up with Purcell Nails, MD In 1 week. (Office will contact you with an appointment)    Specialty:  Cardiothoracic Surgery   Contact information:   [address] [place] [address] [phone]       Follow up with Rollene Rotunda, MD In 2 weeks.   Specialty:  Cardiology   Contact information:   [address] [place] [address] [phone]       Follow up with [facility]. (Have bloodwork (PT/INR) drawn for management of Coumadin)    Specialty:  Cardiology   Contact information:   [address] [place] [address] [phone]           Wilmon Pali 05/01/2014, 8:39 AM

## 2014-05-02 ENCOUNTER — Inpatient Hospital Stay (HOSPITAL_COMMUNITY): Payer: BC Managed Care – PPO

## 2014-05-02 LAB — PROTIME-INR
INR: 1.5 — ABNORMAL HIGH (ref 0.00–1.49)
Prothrombin Time: 17.7 seconds — ABNORMAL HIGH (ref 11.6–15.2)

## 2014-05-02 MED ORDER — GUAIFENESIN ER 600 MG PO TB12
600.0000 mg | ORAL_TABLET | Freq: Two times a day (BID) | ORAL | Status: DC
  Administered 2014-05-02 (×2): 600 mg via ORAL
  Filled 2014-05-02 (×4): qty 1

## 2014-05-02 MED ORDER — METOPROLOL TARTRATE 12.5 MG HALF TABLET
12.5000 mg | ORAL_TABLET | Freq: Two times a day (BID) | ORAL | Status: DC
  Administered 2014-05-02 (×2): 12.5 mg via ORAL
  Filled 2014-05-02 (×4): qty 1

## 2014-05-02 NOTE — Progress Notes (Signed)
Pt ambulated 550 feet with rolling walker and 1L O2 via [place]; pt transferred to 2w17 from 2w24; pt denies pain at this time; pt ambulated well without any complaints; steady gait noted; pt to room to chair after ambulation; will cont. To monitor.

## 2014-05-02 NOTE — Progress Notes (Addendum)
[address]       [place] [id number]             [phone]      6 Days Post-Op  Procedure(s) (LRB): MINIMALLY INVASIVE MITRAL VALVE REPAIR (MVR) (Right) INTRAOPERATIVE TRANSESOPHAGEAL ECHOCARDIOGRAM (N/A) Subjective:  Feeling better, less sputum production- clear. Off O2 currently with adeq sats.   Objective  Telemetry sinus in 60's  Temp:  [99.6 F (37.6 C)-101.5 F (38.6 C)] 100 F (37.8 C) (05/06 0540) Pulse Rate:  [65-69] 65 (05/06 0540) Resp:  [18-20] 18 (05/06 0540) BP: (114-131)/(56-72) 131/72 mmHg (05/06 0540) SpO2:  [90 %-95 %] 90 % (05/06 0540) Weight:  [153 lb 3.5 oz (69.5 kg)] 153 lb 3.5 oz (69.5 kg) (05/06 0540)   Intake/Output Summary (Last 24 hours) at 05/02/14 0735 Last data filed at 05/02/14 0300  Gross per 24 hour  Intake    240 ml  Output    750 ml  Net   -510 ml       General appearance: alert, cooperative and no distress Heart: regular rate and rhythm Lungs: dim mildly in right> left base Abdomen: benign Extremities: no edema Wound: incis healing well  Lab Results:  Recent Labs  04/30/14 0555  NA 140  K 4.4  CL 101  CO2 30  GLUCOSE 109*  BUN 11  CREATININE 0.86  CALCIUM 8.7   No results found for this basename: AST, ALT, ALKPHOS, BILITOT, PROT, ALBUMIN,  in the last 72 hours No results found for this basename: LIPASE, AMYLASE,  in the last 72 hours  Recent Labs  04/30/14 0555 05/01/14 0525  WBC 8.9 6.5  NEUTROABS  --  4.4  HGB 8.6* 8.2*  HCT 26.8* 25.5*  MCV 95.7 95.9  PLT 95* 130*   No results found for this basename: CKTOTAL, CKMB, TROPONINI,  in the last 72 hours No components found with this basename: POCBNP,  No results found for this basename: DDIMER,  in the last 72 hours No results found for this basename: HGBA1C,  in the last 72 hours No results found for this basename: CHOL, HDL, LDLCALC, TRIG, CHOLHDL,  in the last 72 hours No results found for this basename: TSH, T4TOTAL,  FREET3, T3FREE, THYROIDAB,  in the last 72 hours No results found for this basename: VITAMINB12, FOLATE, FERRITIN, TIBC, IRON, RETICCTPCT,  in the last 72 hours  Medications: Scheduled . aspirin EC  81 mg Oral Daily  . bisacodyl  10 mg Oral Daily   Or  . bisacodyl  10 mg Rectal Daily  . coumadin book   Does not apply Once  . docusate sodium  200 mg Oral Daily  . furosemide  40 mg Oral Daily  . levofloxacin  750 mg Oral Daily  . pantoprazole  40 mg Oral Daily  . potassium chloride  20 mEq Oral BID  . sodium chloride  3 mL Intravenous Q12H  . warfarin  5 mg Oral q1800  . warfarin   Does not apply Once  . Warfarin - Physician Dosing Inpatient   Does not apply q1800     Radiology/Studies:  Dg Chest 2 View  05/02/2014   CLINICAL DATA:  follow up right lung opacity  EXAM: CHEST  2 VIEW  COMPARISON:  DG CHEST 2 VIEW dated 04/30/2014; DG CHEST 1V PORT dated 04/29/2014;  FINDINGS: Low lung volumes. The cardiac silhouette is enlarged. The mitral valve prostheses is stable. There is decreased conspicuity of the  right upper lobe density. Diffuse thickening of the interstitial markings is appreciated, right greater than left. There is blunting of the costophrenic angles. A mild area of tenting within the right lung base. No new focal regions of consolidation or new focal infiltrates. No acute osseus abnormalities.  IMPRESSION: Improvement in the right upper lobe infiltrate.  Small bilateral effusions  Volume loss right lung base  Interstitial findings may reflect a component of mild pulmonary edema.   Electronically Signed   By: Salome Holmes M.D.   On: 05/02/2014 07:16    INR: 1.50 Will add last result for INR, ABG once components are confirmed Will add last 4 CBG results once components are confirmed  Assessment/Plan: S/P Procedure(s) (LRB): MINIMALLY INVASIVE MITRAL VALVE REPAIR (MVR) (Right) INTRAOPERATIVE TRANSESOPHAGEAL ECHOCARDIOGRAM (N/A)  1 remains with low grade fevers- tmax 101.5. On  levofloxin. Cultures all neg so far. Sats decreased to 88% with walk, but asymptomatic and off O2 currently. 2 CXR improving 3 will need to determine if he needs continued lasix at discharge 4 cont AC RX 5 poss d/c today or possibly tomorrow     LOS: 6 days    Rowe Clack 5/6/20157:35 AM  I have seen and examined the patient and agree with the assessment and plan as outlined.  Looks a little better and CXR improved, but still had low grade temp overnight.  Maintaining NSR.  Will restart low dose beta blocker and tentatively plan d/c home tomorrow.  Purcell Nails 05/02/2014 8:42 AM

## 2014-05-02 NOTE — Progress Notes (Signed)
Pt ambulated on RA 150 ft without complaints of pain or SOB, O2 saturation checked pt was 88%.   RN placed pt on 1L O2 pt ambulated another 1300 ft tolerated well with no breaks, SOB, or c/o of pain.  Pt O2 saturation on 1L O2 91% after walk.  Pt returned to bed, without O2 call light in reach, RN will continue to monitor.   Thane Edu, RN

## 2014-05-02 NOTE — Progress Notes (Signed)
1500 Cardiac Rehab Pt states that he has had two walks today and he is tired now. He states that he will take another walk before bedtime tonight. We will follow pt tomorrow. Beatrix Fetters, RN 05/02/2014 3:10 PM

## 2014-05-03 LAB — TYPE AND SCREEN
ABO/RH(D): A POS
Antibody Screen: NEGATIVE
Unit division: 0
Unit division: 0
Unit division: 0
Unit division: 0
Unit division: 0
Unit division: 0
Unit division: 0

## 2014-05-03 LAB — PROTIME-INR
INR: 1.99 — ABNORMAL HIGH (ref 0.00–1.49)
Prothrombin Time: 22 seconds — ABNORMAL HIGH (ref 11.6–15.2)

## 2014-05-03 MED ORDER — WARFARIN SODIUM 2.5 MG PO TABS: 2.5000 mg | ORAL_TABLET | Freq: Every day | ORAL | Status: DC

## 2014-05-03 MED ORDER — ASPIRIN 81 MG PO TBEC: 81.0000 mg | DELAYED_RELEASE_TABLET | Freq: Every day | ORAL | Status: DC

## 2014-05-03 MED ORDER — LEVOFLOXACIN 750 MG PO TABS: 750.0000 mg | ORAL_TABLET | Freq: Every day | ORAL | Status: DC

## 2014-05-03 MED ORDER — METOPROLOL TARTRATE 12.5 MG HALF TABLET: 12.5000 mg | ORAL_TABLET | Freq: Two times a day (BID) | ORAL | Status: DC

## 2014-05-03 MED ORDER — OXYCODONE HCL 5 MG PO TABS: 5.0000 mg | ORAL_TABLET | ORAL | Status: DC | PRN

## 2014-05-03 MED ORDER — FUROSEMIDE 40 MG PO TABS: 40.0000 mg | ORAL_TABLET | Freq: Every day | ORAL | Status: DC

## 2014-05-03 MED ORDER — POTASSIUM CHLORIDE CRYS ER 20 MEQ PO TBCR: 20.0000 meq | EXTENDED_RELEASE_TABLET | Freq: Every day | ORAL | Status: DC

## 2014-05-03 NOTE — Progress Notes (Signed)
Pt D/C home alert and oriented.  Pt had complained of mild left pain in his bicep that seemed musculoskeletal, MD was informed and pain dissipated after a minute.  No other c/o pain.  Dressings to right of sternum and right upper abdomen dry/intact.  Pt given education and dressing supplies to keep dressing dry in case it leaks at all at home.  Pt given education on diet, activity, meds, and follow-up appts and care.  Pt verbalized understanding.  He stated he thought home health would come to his house, but I informed him that the physician did not identify that need.  Pt verbalized understanding.

## 2014-05-03 NOTE — Progress Notes (Signed)
1013-1040 Pt discharged this am, but remains in room awaiting ride home. Education complete. Pt verbalized understanding with teach back. With patients consent, order placed for phase 2 cardiac rehab.

## 2014-05-03 NOTE — Progress Notes (Addendum)
[address]       [place] [id number]             [phone]      7 Days Post-Op  Procedure(s) (LRB): MINIMALLY INVASIVE MITRAL VALVE REPAIR (MVR) (Right) INTRAOPERATIVE TRANSESOPHAGEAL ECHOCARDIOGRAM (N/A) Subjective: Feels ok. Breathing is comfortable. Fever curve is improving  Objective  Telemetry junctional in 60's  Temp:  [98.8 F (37.1 C)-100.8 F (38.2 C)] 100.8 F (38.2 C) (05/07 0437) Pulse Rate:  [57-68] 63 (05/07 0437) Resp:  [18] 18 (05/07 0437) BP: (106-145)/(55-76) 127/68 mmHg (05/07 0437) SpO2:  [91 %-100 %] 91 % (05/07 0437) Weight:  [151 lb 7.3 oz (68.7 kg)] 151 lb 7.3 oz (68.7 kg) (05/07 0437)   Intake/Output Summary (Last 24 hours) at 05/03/14 0736 Last data filed at 05/02/14 1723  Gross per 24 hour  Intake    480 ml  Output    850 ml  Net   -370 ml       General appearance: alert, cooperative and no distress Heart: regular rate and rhythm Lungs: clear to auscultation bilaterally Abdomen: benugn Extremities: no edema Wound: incis healing well  Lab Results: No results found for this basename: NA, K, CL, CO2, GLUCOSE, BUN, CREATININE, CALCIUM, MG, PHOS,  in the last 72 hours No results found for this basename: AST, ALT, ALKPHOS, BILITOT, PROT, ALBUMIN,  in the last 72 hours No results found for this basename: LIPASE, AMYLASE,  in the last 72 hours  Recent Labs  05/01/14 0525  WBC 6.5  NEUTROABS 4.4  HGB 8.2*  HCT 25.5*  MCV 95.9  PLT 130*   No results found for this basename: CKTOTAL, CKMB, TROPONINI,  in the last 72 hours No components found with this basename: POCBNP,  No results found for this basename: DDIMER,  in the last 72 hours No results found for this basename: HGBA1C,  in the last 72 hours No results found for this basename: CHOL, HDL, LDLCALC, TRIG, CHOLHDL,  in the last 72 hours No results found for this basename: TSH, T4TOTAL, FREET3, T3FREE, THYROIDAB,  in the last 72 hours No results found for  this basename: VITAMINB12, FOLATE, FERRITIN, TIBC, IRON, RETICCTPCT,  in the last 72 hours  Medications: Scheduled . aspirin EC  81 mg Oral Daily  . bisacodyl  10 mg Oral Daily   Or  . bisacodyl  10 mg Rectal Daily  . coumadin book   Does not apply Once  . docusate sodium  200 mg Oral Daily  . furosemide  40 mg Oral Daily  . guaiFENesin  600 mg Oral BID  . levofloxacin  750 mg Oral Daily  . metoprolol tartrate  12.5 mg Oral BID  . pantoprazole  40 mg Oral Daily  . potassium chloride  20 mEq Oral BID  . sodium chloride  3 mL Intravenous Q12H  . warfarin  5 mg Oral q1800  . warfarin   Does not apply Once  . Warfarin - Physician Dosing Inpatient   Does not apply q1800     Radiology/Studies:  Dg Chest 2 View  05/02/2014   CLINICAL DATA:  follow up right lung opacity  EXAM: CHEST  2 VIEW  COMPARISON:  DG CHEST 2 VIEW dated 04/30/2014; DG CHEST 1V PORT dated 04/29/2014;  FINDINGS: Low lung volumes. The cardiac silhouette is enlarged. The mitral valve prostheses is stable. There is decreased conspicuity of the right upper lobe density. Diffuse thickening of the interstitial markings  is appreciated, right greater than left. There is blunting of the costophrenic angles. A mild area of tenting within the right lung base. No new focal regions of consolidation or new focal infiltrates. No acute osseus abnormalities.  IMPRESSION: Improvement in the right upper lobe infiltrate.  Small bilateral effusions  Volume loss right lung base  Interstitial findings may reflect a component of mild pulmonary edema.   Electronically Signed   By: Salome Holmes M.D.   On: 05/02/2014 07:16    INR:1.99 Will add last result for INR, ABG once components are confirmed Will add last 4 CBG results once components are confirmed  Assessment/Plan: S/P Procedure(s) (LRB): MINIMALLY INVASIVE MITRAL VALVE REPAIR (MVR) (Right) INTRAOPERATIVE TRANSESOPHAGEAL ECHOCARDIOGRAM (N/A)  1 steady improvement. Appears ready for d/c  2  cont levaquin 3 coumadin 2. 5 mg daily    LOS: 7 days    Rowe Clack 5/7/20157:36 AM  I have seen and examined the patient and agree with the assessment and plan as outlined.  D/C home today.  Stop ASA now that he is therapeutic on coumadin.    Purcell Nails 05/03/2014 8:03 AM

## 2014-05-04 ENCOUNTER — Other Ambulatory Visit: Payer: Self-pay | Admitting: Thoracic Surgery (Cardiothoracic Vascular Surgery)

## 2014-05-04 DIAGNOSIS — I059 Rheumatic mitral valve disease, unspecified: Secondary | ICD-10-CM

## 2014-05-06 LAB — CULTURE, BLOOD (ROUTINE X 2)
Culture: NO GROWTH
Culture: NO GROWTH

## 2014-05-07 ENCOUNTER — Ambulatory Visit
Admission: RE | Admit: 2014-05-07 | Discharge: 2014-05-07 | Disposition: A | Discharge status: Home / Self Care | Payer: BC Managed Care – PPO | Source: Ambulatory Visit | Attending: Thoracic Surgery (Cardiothoracic Vascular Surgery) | Admitting: Thoracic Surgery (Cardiothoracic Vascular Surgery)

## 2014-05-07 ENCOUNTER — Ambulatory Visit (INDEPENDENT_AMBULATORY_CARE_PROVIDER_SITE_OTHER): Payer: Self-pay | Admitting: Thoracic Surgery (Cardiothoracic Vascular Surgery)

## 2014-05-07 ENCOUNTER — Encounter: Payer: Self-pay | Admitting: Thoracic Surgery (Cardiothoracic Vascular Surgery)

## 2014-05-07 ENCOUNTER — Ambulatory Visit (INDEPENDENT_AMBULATORY_CARE_PROVIDER_SITE_OTHER): Payer: BC Managed Care – PPO | Admitting: *Deleted

## 2014-05-07 VITALS — BP 114/82 | HR 80 | Resp 20 | Ht 67.0 in | Wt 150.0 lb

## 2014-05-07 DIAGNOSIS — Z9889 Other specified postprocedural states: Secondary | ICD-10-CM

## 2014-05-07 DIAGNOSIS — I059 Rheumatic mitral valve disease, unspecified: Secondary | ICD-10-CM

## 2014-05-07 DIAGNOSIS — I340 Nonrheumatic mitral (valve) insufficiency: Secondary | ICD-10-CM

## 2014-05-07 LAB — POCT INR: INR: 2.7

## 2014-05-07 NOTE — Patient Instructions (Signed)
Go to Coumadin Clinic today to get prothrombin time checked  Stop taking furosemide (Lasix) and potassium  The patient may continue to gradually increase their physical activity as tolerated.  They should refrain from any heavy lifting or strenuous use of their arms and shoulders until at least 8 weeks from the time of their surgery, and they should avoid activities that cause increased pain in their chest on the side of their surgical incision.  Otherwise they may continue to increase their activities without any particular limitations.  The patient may return to driving an automobile as long as they are no longer requiring oral narcotic pain relievers during the daytime.  It would be wise to start driving only short distances during the daylight and gradually increase from there as they feel comfortable.

## 2014-05-07 NOTE — Progress Notes (Signed)
[address]       [place] [id number]             [phone]     CARDIOTHORACIC SURGERY OFFICE NOTE  Referring Provider is [name], [name], MD PCP is [name], [name], MD   HPI:  Patient returns for routine followup status post minimally invasive mitral valve repair on 04/26/2014. His postoperative recovery has been uncomplicated. Since hospital discharge the patient has done quite well. He reports that he no longer has any significant pain in his chest. He feels some numbness along the surgical incision. He has no shortness of breath. His cough has resolved. His appetite is good. He is sleeping well at night. He has been slowly increasing his physical activity without any particular problems or limitations so far. He has not had his prothrombin time checked in the Coumadin clinic.   Current Outpatient Prescriptions  Medication Sig Dispense Refill  . aspirin EC 81 MG EC tablet Take 1 tablet (81 mg total) by mouth daily.      Marland Kitchen. esomeprazole (NEXIUM) 40 MG capsule Take 40 mg by mouth daily as needed (reflux).       . furosemide (LASIX) 40 MG tablet Take 1 tablet (40 mg total) by mouth daily.  7 tablet  0  . levofloxacin (LEVAQUIN) 750 MG tablet Take 1 tablet (750 mg total) by mouth daily.  8 tablet  0  . metoprolol tartrate (LOPRESSOR) 12.5 mg TABS tablet Take 0.5 tablets (12.5 mg total) by mouth 2 (two) times daily.  60 each  1  . oxyCODONE (OXY IR/ROXICODONE) 5 MG immediate release tablet Take 1-2 tablets (5-10 mg total) by mouth every 4 (four) hours as needed for moderate pain.  50 tablet  0  . potassium chloride SA (K-DUR,KLOR-CON) 20 MEQ tablet Take 1 tablet (20 mEq total) by mouth daily.  7 tablet  0  . warfarin (COUMADIN) 2.5 MG tablet Take 1 tablet (2.5 mg total) by mouth daily at 6 PM.  100 tablet  1   No current facility-administered medications for this visit.      Physical Exam:   BP 114/82  Pulse 80  Resp 20  Ht 5\' 7"  (1.702 m)  Wt 150 lb (68.04  kg)  BMI 23.49 kg/m2  SpO2 96%  General:  Well-appearing  Chest:   Clear to auscultation  CV:   Regular rate and rhythm without murmur  Incisions:  Clean and dry and healing nicely  Abdomen:  Soft and nontender  Extremities:  Warm and well-perfused  Diagnostic Tests:  CHEST 2 VIEW  COMPARISON: May 02, 2014  FINDINGS:  There is been significant clearing of interstitial infiltrate from  the right lung compared to recent prior study. Some patchy  interstitial infiltrate in the right base remains. Elsewhere, lungs  are clear. There is underlying emphysematous change.  Heart is enlarged with normal pulmonary vascularity. There is a  prosthetic mitral valve. No adenopathy. No bone lesions.  IMPRESSION:  Interval clearing since recent prior study. Patchy interstitial  infiltrate remains in the right lung. Underlying emphysema. No new  opacity. No change in cardiac silhouette.  Electronically Signed  By: Bretta BangWilliam Woodruff M.D.  On: 05/07/2014 11:46    Impression:  Patient is doing very well less than 2 weeks status post minimally invasive mitral valve repair  Plan:  I've instructed the patient to go directly to the Coumadin clinic to have his prothrombin time checked today. I've also instructed him  to stop taking Lasix and potassium. I've encouraged him to continue to gradually increase his physical activity as tolerated. He may resume driving an automobile. I think he could potentially go back to work within the next few weeks. All of his questions been addressed. We'll plan to see him back again in 4 weeks to make sure that he continues to recover uneventfully.   Salvatore Decent. Cornelius Moras, MD 05/07/2014 12:15 PM

## 2014-05-11 ENCOUNTER — Ambulatory Visit (INDEPENDENT_AMBULATORY_CARE_PROVIDER_SITE_OTHER): Payer: BC Managed Care – PPO | Admitting: Pharmacist

## 2014-05-11 DIAGNOSIS — Z9889 Other specified postprocedural states: Secondary | ICD-10-CM

## 2014-05-11 DIAGNOSIS — I340 Nonrheumatic mitral (valve) insufficiency: Secondary | ICD-10-CM

## 2014-05-11 DIAGNOSIS — I059 Rheumatic mitral valve disease, unspecified: Secondary | ICD-10-CM

## 2014-05-11 LAB — POCT INR: INR: 2.4

## 2014-05-18 ENCOUNTER — Ambulatory Visit (INDEPENDENT_AMBULATORY_CARE_PROVIDER_SITE_OTHER): Payer: BC Managed Care – PPO | Admitting: *Deleted

## 2014-05-18 DIAGNOSIS — Z9889 Other specified postprocedural states: Secondary | ICD-10-CM

## 2014-05-18 DIAGNOSIS — I340 Nonrheumatic mitral (valve) insufficiency: Secondary | ICD-10-CM

## 2014-05-18 DIAGNOSIS — I059 Rheumatic mitral valve disease, unspecified: Secondary | ICD-10-CM

## 2014-05-18 LAB — POCT INR: INR: 2.2

## 2014-05-22 ENCOUNTER — Encounter: Payer: Self-pay | Admitting: *Deleted

## 2014-05-22 ENCOUNTER — Ambulatory Visit (INDEPENDENT_AMBULATORY_CARE_PROVIDER_SITE_OTHER): Payer: BC Managed Care – PPO | Admitting: *Deleted

## 2014-05-22 ENCOUNTER — Encounter: Payer: Self-pay | Admitting: Physician Assistant

## 2014-05-22 ENCOUNTER — Ambulatory Visit (INDEPENDENT_AMBULATORY_CARE_PROVIDER_SITE_OTHER): Payer: BC Managed Care – PPO | Admitting: Physician Assistant

## 2014-05-22 VITALS — BP 146/64 | HR 89 | Ht 67.0 in | Wt 179.0 lb

## 2014-05-22 DIAGNOSIS — I4892 Unspecified atrial flutter: Secondary | ICD-10-CM

## 2014-05-22 DIAGNOSIS — I059 Rheumatic mitral valve disease, unspecified: Secondary | ICD-10-CM

## 2014-05-22 DIAGNOSIS — I340 Nonrheumatic mitral (valve) insufficiency: Secondary | ICD-10-CM

## 2014-05-22 DIAGNOSIS — Z9889 Other specified postprocedural states: Secondary | ICD-10-CM

## 2014-05-22 LAB — POCT INR: INR: 2.2

## 2014-05-22 MED ORDER — METOPROLOL TARTRATE 12.5 MG HALF TABLET: 12.5000 mg | ORAL_TABLET | Freq: Two times a day (BID) | ORAL | Status: DC

## 2014-05-22 NOTE — Patient Instructions (Addendum)
Your physician has recommended that you have a Cardioversion (DCCV).DCCV 06/01/14 11 AM WITH DR CRENSHAW.Marland Kitchen Electrical Cardioversion uses a jolt of electricity to your heart either through paddles or wired patches attached to your chest. This is a controlled, usually prescheduled, procedure. Defibrillation is done under light anesthesia in the hospital, and you usually go home the day of the procedure. This is done to get your heart back into a normal rhythm. You are not awake for the procedure. Please see the instruction sheet given to you today.  Your physician recommends that you continue on your current medications as directed. Please refer to the Current Medication list given to you today.  Your physician has requested that you have an echocardiogram. Echocardiography is a painless test that uses sound waves to create images of your heart. It provides your doctor with information about the size and shape of your heart and how well your heart's chambers and valves are working. This procedure takes approximately one hour. There are no restrictions for this procedure.  Your physician recommends that you schedule a follow-up appointment in: 3 WEEKS WITH  SCOTT WEAVER SAME DAY HOCHRIEN IS IN OFFICE  INR CHECKED TODAY AND WILL RECHECK AGAIN NEXT WEEK  PENDING DCCV

## 2014-05-22 NOTE — H&P (Signed)
History and Physical   Date:  05/22/2014   ID:  Bradley CocoKatie D Keithley, DOB 1951-03-27, MRN [id number]  PCP:  [name], [name], MD  Cardiologist:  Dr. Rollene RotundaJames Hochrein      History of Present Illness: Bradley Hansen is a 63 y.o. male with a hx of MVP with assoc MR. He had been evaluated by [name] in 2012 4 consideration of mitral valve repair. He was asymptomatic at that time with preserved EF and was followed clinically. He was recently seen back with symptoms felt to be attributable to his mitral regurgitation. He underwent cardiac catheterization which demonstrated no significant CAD. He was referred back to surgery and was admitted 4/30-5/7. He underwent minimally invasive mitral valve repair with [name] on 04/26/14. He did have postoperative atrial fibrillation. He converted to NSR on amiodarone. Amiodarone was discontinued secondary to junctional rhythm. He did require antibiotics for treatment of presumed tracheobronchitis. Otherwise, his postoperative course was fairly uneventful. He returns for follow up.  He has been doing well.  Still has some chest soreness.  He denies dyspnea, orthopnea, PND, edema.  He denies syncope.     Studies:  - LHC (01/2014):  No significant CAD (Mid LAD 25%), EF 60%  - Echo (01/2014):  EF 55-60%, normal wall motion, severe holosystolic prolapse of the mitral valve anterior leaflet, moderate to severe MR directed posteriorly, moderate to severe LAE, PASP 31 mmHg  - Carotid US (03/2014):  Bilateral ICA 1-39%   Recent Labs: 04/23/2014: ALT 18  04/30/2014: Creatinine 0.86; Potassium 4.4  05/01/2014: Hemoglobin 8.2*   Wt Readings from Last 3 Encounters:  05/22/14 179 lb (81.194 kg)  05/07/14 150 lb (68.04 kg)  05/03/14 151 lb 7.3 oz (68.7 kg)     Past Medical History  Diagnosis Date  . Mitral valve prolapse     TEE  08/2010 severe MR, bileaflet MVP, EF 60%  . Hypercholesteremia   . Synovitis of hand 2011    right hand  . Leukopenia   . Rhinitis 2011    chronic     . Hepatitis C 2004    pt doesn't want treatment  . Fibromyalgia   . Prostatitis   . Prostadynia   . Mitral regurgitation   . MITRAL VALVE PROLAPSE 02/24/2007    Qualifier: Diagnosis of  By: [name], [name]    . GASTROESOPHAGEAL REFLUX, NO ESOPHAGITIS 02/24/2007    Qualifier: Diagnosis of  By: [name], [name]    . HEPATITIS C 02/24/2007    Qualifier: Diagnosis of  By: [name], [name]    . BACK PAIN, CHRONIC 12/11/2009    Qualifier: Diagnosis of  By: [name], [name]    . S/P minimally invasive mitral valve repair 04/26/2014    Complex valvuloplasty including artificial Gore-tex neocord placement x8, bovine pericardial patch augmentation of P3 portion of posterior leaflet and 36 mm Sorin Memo 3D ring annuloplasty via right mini thoracotomy    Current Outpatient Prescriptions  Medication Sig Dispense Refill  . aspirin EC 81 MG EC tablet Take 1 tablet (81 mg total) by mouth daily.      Marland Kitchen. esomeprazole (NEXIUM) 40 MG capsule Take 40 mg by mouth daily as needed (reflux).       . furosemide (LASIX) 40 MG tablet Take 40 mg by mouth as needed.       Marland Kitchen. KLOR-CON M20 20 MEQ tablet Take 20 mEq by mouth as needed.       Marland Kitchen. levofloxacin (LEVAQUIN) 750 MG tablet Take 1 tablet (  750 mg total) by mouth daily.  8 tablet  0  . metoprolol tartrate (LOPRESSOR) 12.5 mg TABS tablet Take 0.5 tablets (12.5 mg total) by mouth 2 (two) times daily.  60 each  1  . oxyCODONE (OXY IR/ROXICODONE) 5 MG immediate release tablet Take 1-2 tablets (5-10 mg total) by mouth every 4 (four) hours as needed for moderate pain.  50 tablet  0  . warfarin (COUMADIN) 2.5 MG tablet Take 1 tablet (2.5 mg total) by mouth daily at 6 PM.  100 tablet  1   No current facility-administered medications for this visit.    Allergies:   Review of patient's allergies indicates no known allergies.   Social History:  The patient  reports that he has never smoked. He has never used smokeless tobacco. He reports that he drinks alcohol. He reports  that he does not use illicit drugs.   Family History:  The patient's family history is negative for Heart attack, Cancer, and Stroke.   ROS:  Please see the history of present illness.   He denies any bleeding problems.    All other systems reviewed and negative.   PHYSICAL EXAM: VS:  BP 146/64  Pulse 89  Ht 5\' 7"  (1.702 m)  Wt 179 lb (81.194 kg)  BMI 28.03 kg/m2 Well nourished, well developed, in no acute distress HEENT: normal Neck: no JVD Cardiac:  normal S1, S2; irreg irreg rhythm; no murmur Chest:  R chest incision well healed Lungs:  clear to auscultation bilaterally, no wheezing, rhonchi or rales Abd: soft, nontender, no hepatomegaly Ext: no edema Skin: warm and dry Neuro:  CNs 2-12 intact, no focal abnormalities noted  EKG:  AFlutter, HR 89, variable AVB     ASSESSMENT AND PLAN:  1. Atrial flutter:  He is now in AFlutter with controlled rate.  He is asymptomatic.  He has been on coumadin and INR has been therapeutic since 05/07/14.  I reviewed his case with Dr. Rollene Rotunda.  We will check his INR today and again next week.  If his INR remains therapeutic, we will plan on DCCV next week (3 weeks of therapeutic INR).  Continue metoprolol 12.5 bid.   2. Mitral regurgitation S/P minimally invasive mitral valve repair:  Doing well after recent MV repair.  He understands the importance of SBE prophylaxis.  F/u with Dr. Cornelius Moras as planned.  Obtain follow up echo. 3. Disposition:  F/u with me or Dr. Rollene Rotunda in 3 weeks.     Signed, Brynda Rim, MHS 05/22/2014 9:27 AM    [facility] [address] [place], [place], [place]  [address] Phone: [phone]; Fax: [phone]

## 2014-05-22 NOTE — Progress Notes (Signed)
Cardiology Office Note   Date:  05/22/2014   ID:  Bradley Hansen, DOB 07/08/1951, MRN [id number]  PCP:  [name], [name], MD  Cardiologist:  Dr. Rollene RotundaJames Hochrein      History of Present Illness: Bradley Hansen is a 63 y.o. male with a hx of MVP with assoc MR. He had been evaluated by [name] in 2012 4 consideration of mitral valve repair. He was asymptomatic at that time with preserved EF and was followed clinically. He was recently seen back with symptoms felt to be attributable to his mitral regurgitation. He underwent cardiac catheterization which demonstrated no significant CAD. He was referred back to surgery and was admitted 4/30-5/7. He underwent minimally invasive mitral valve repair with [name] on 04/26/14. He did have postoperative atrial fibrillation. He converted to NSR on amiodarone. Amiodarone was discontinued secondary to junctional rhythm. He did require antibiotics for treatment of presumed tracheobronchitis. Otherwise, his postoperative course was fairly uneventful. He returns for follow up.  He has been doing well.  Still has some chest soreness.  He denies dyspnea, orthopnea, PND, edema.  He denies syncope.     Studies:  - LHC (01/2014):  No significant CAD (Mid LAD 25%), EF 60%  - Echo (01/2014):  EF 55-60%, normal wall motion, severe holosystolic prolapse of the mitral valve anterior leaflet, moderate to severe MR directed posteriorly, moderate to severe LAE, PASP 31 mmHg  - Carotid US (03/2014):  Bilateral ICA 1-39%   Recent Labs: 04/23/2014: ALT 18  04/30/2014: Creatinine 0.86; Potassium 4.4  05/01/2014: Hemoglobin 8.2*   Wt Readings from Last 3 Encounters:  05/22/14 179 lb (81.194 kg)  05/07/14 150 lb (68.04 kg)  05/03/14 151 lb 7.3 oz (68.7 kg)     Past Medical History  Diagnosis Date  . Mitral valve prolapse     TEE  08/2010 severe MR, bileaflet MVP, EF 60%  . Hypercholesteremia   . Synovitis of hand 2011    right hand  . Leukopenia   . Rhinitis 2011    chronic     . Hepatitis C 2004    pt doesn't want treatment  . Fibromyalgia   . Prostatitis   . Prostadynia   . Mitral regurgitation   . MITRAL VALVE PROLAPSE 02/24/2007    Qualifier: Diagnosis of  By: [name], [name]    . GASTROESOPHAGEAL REFLUX, NO ESOPHAGITIS 02/24/2007    Qualifier: Diagnosis of  By: [name], [name]    . HEPATITIS C 02/24/2007    Qualifier: Diagnosis of  By: [name], [name]    . BACK PAIN, CHRONIC 12/11/2009    Qualifier: Diagnosis of  By: [name], [name]    . S/P minimally invasive mitral valve repair 04/26/2014    Complex valvuloplasty including artificial Gore-tex neocord placement x8, bovine pericardial patch augmentation of P3 portion of posterior leaflet and 36 mm Sorin Memo 3D ring annuloplasty via right mini thoracotomy    Current Outpatient Prescriptions  Medication Sig Dispense Refill  . aspirin EC 81 MG EC tablet Take 1 tablet (81 mg total) by mouth daily.      Marland Kitchen. esomeprazole (NEXIUM) 40 MG capsule Take 40 mg by mouth daily as needed (reflux).       . furosemide (LASIX) 40 MG tablet Take 40 mg by mouth as needed.       Marland Kitchen. KLOR-CON M20 20 MEQ tablet Take 20 mEq by mouth as needed.       Marland Kitchen. levofloxacin (LEVAQUIN) 750 MG tablet Take 1 tablet (  750 mg total) by mouth daily.  8 tablet  0  . metoprolol tartrate (LOPRESSOR) 12.5 mg TABS tablet Take 0.5 tablets (12.5 mg total) by mouth 2 (two) times daily.  60 each  1  . oxyCODONE (OXY IR/ROXICODONE) 5 MG immediate release tablet Take 1-2 tablets (5-10 mg total) by mouth every 4 (four) hours as needed for moderate pain.  50 tablet  0  . warfarin (COUMADIN) 2.5 MG tablet Take 1 tablet (2.5 mg total) by mouth daily at 6 PM.  100 tablet  1   No current facility-administered medications for this visit.    Allergies:   Review of patient's allergies indicates no known allergies.   Social History:  The patient  reports that he has never smoked. He has never used smokeless tobacco. He reports that he drinks alcohol. He reports  that he does not use illicit drugs.   Family History:  The patient's family history is negative for Heart attack, Cancer, and Stroke.   ROS:  Please see the history of present illness.   He denies any bleeding problems.    All other systems reviewed and negative.   PHYSICAL EXAM: VS:  BP 146/64  Pulse 89  Ht 5\' 7"  (1.702 m)  Wt 179 lb (81.194 kg)  BMI 28.03 kg/m2 Well nourished, well developed, in no acute distress HEENT: normal Neck: no JVD Cardiac:  normal S1, S2; irreg irreg rhythm; no murmur Chest:  R chest incision well healed Lungs:  clear to auscultation bilaterally, no wheezing, rhonchi or rales Abd: soft, nontender, no hepatomegaly Ext: no edema Skin: warm and dry Neuro:  CNs 2-12 intact, no focal abnormalities noted  EKG:  AFlutter, HR 89, variable AVB     ASSESSMENT AND PLAN:  1. Atrial flutter:  He is now in AFlutter with controlled rate.  He is asymptomatic.  He has been on coumadin and INR has been therapeutic since 05/07/14.  I reviewed his case with Dr. Rollene Rotunda.  We will check his INR today and again next week.  If his INR remains therapeutic, we will plan on DCCV next week (3 weeks of therapeutic INR).  Continue metoprolol 12.5 bid.   2. Mitral regurgitation S/P minimally invasive mitral valve repair:  Doing well after recent MV repair.  He understands the importance of SBE prophylaxis.  F/u with Dr. Cornelius Moras as planned.  Obtain follow up echo. 3. Disposition:  F/u with me or Dr. Rollene Rotunda in 3 weeks.     Signed, Brynda Rim, MHS 05/22/2014 9:27 AM    [facility] [address] [place], [place], [place]  [address] Phone: [phone]; Fax: [phone]

## 2014-05-23 ENCOUNTER — Encounter: Payer: Self-pay | Admitting: Physician Assistant

## 2014-05-23 ENCOUNTER — Ambulatory Visit (HOSPITAL_COMMUNITY): Payer: BC Managed Care – PPO | Attending: Cardiovascular Disease | Admitting: Cardiology

## 2014-05-23 DIAGNOSIS — I059 Rheumatic mitral valve disease, unspecified: Secondary | ICD-10-CM

## 2014-05-23 DIAGNOSIS — I341 Nonrheumatic mitral (valve) prolapse: Secondary | ICD-10-CM

## 2014-05-23 DIAGNOSIS — I340 Nonrheumatic mitral (valve) insufficiency: Secondary | ICD-10-CM

## 2014-05-23 DIAGNOSIS — Z9889 Other specified postprocedural states: Secondary | ICD-10-CM

## 2014-05-23 NOTE — Progress Notes (Signed)
Echo performed. 

## 2014-05-24 ENCOUNTER — Telehealth: Payer: Self-pay | Admitting: *Deleted

## 2014-05-24 ENCOUNTER — Encounter (HOSPITAL_COMMUNITY): Payer: Self-pay | Admitting: Pharmacy Technician

## 2014-05-24 NOTE — Telephone Encounter (Signed)
lmptcb for echo results 

## 2014-05-25 NOTE — Telephone Encounter (Signed)
pt notified about echo results with verbal understanding 

## 2014-05-28 ENCOUNTER — Ambulatory Visit (INDEPENDENT_AMBULATORY_CARE_PROVIDER_SITE_OTHER): Payer: BC Managed Care – PPO | Admitting: *Deleted

## 2014-05-28 DIAGNOSIS — I340 Nonrheumatic mitral (valve) insufficiency: Secondary | ICD-10-CM

## 2014-05-28 DIAGNOSIS — Z9889 Other specified postprocedural states: Secondary | ICD-10-CM

## 2014-05-28 DIAGNOSIS — I059 Rheumatic mitral valve disease, unspecified: Secondary | ICD-10-CM

## 2014-05-28 DIAGNOSIS — Z5181 Encounter for therapeutic drug level monitoring: Secondary | ICD-10-CM

## 2014-05-28 LAB — POCT INR: INR: 2

## 2014-06-01 ENCOUNTER — Encounter (HOSPITAL_COMMUNITY): Payer: BC Managed Care – PPO | Admitting: Certified Registered Nurse Anesthetist

## 2014-06-01 ENCOUNTER — Ambulatory Visit (HOSPITAL_COMMUNITY): Payer: BC Managed Care – PPO | Admitting: Certified Registered Nurse Anesthetist

## 2014-06-01 ENCOUNTER — Ambulatory Visit (HOSPITAL_COMMUNITY)
Admission: RE | Admit: 2014-06-01 | Discharge: 2014-06-01 | Disposition: A | Discharge status: Home / Self Care | Payer: BC Managed Care – PPO | Source: Ambulatory Visit | Attending: Cardiology | Admitting: Cardiology

## 2014-06-01 ENCOUNTER — Encounter (HOSPITAL_COMMUNITY)
Admission: RE | Disposition: A | Discharge status: Home / Self Care | Payer: Self-pay | Source: Ambulatory Visit | Attending: Cardiology

## 2014-06-01 ENCOUNTER — Ambulatory Visit (INDEPENDENT_AMBULATORY_CARE_PROVIDER_SITE_OTHER): Payer: BC Managed Care – PPO | Admitting: *Deleted

## 2014-06-01 ENCOUNTER — Encounter (HOSPITAL_COMMUNITY): Payer: Self-pay | Admitting: Certified Registered Nurse Anesthetist

## 2014-06-01 DIAGNOSIS — IMO0001 Reserved for inherently not codable concepts without codable children: Secondary | ICD-10-CM | POA: Insufficient documentation

## 2014-06-01 DIAGNOSIS — I340 Nonrheumatic mitral (valve) insufficiency: Secondary | ICD-10-CM

## 2014-06-01 DIAGNOSIS — G8929 Other chronic pain: Secondary | ICD-10-CM | POA: Insufficient documentation

## 2014-06-01 DIAGNOSIS — Z9889 Other specified postprocedural states: Secondary | ICD-10-CM

## 2014-06-01 DIAGNOSIS — K219 Gastro-esophageal reflux disease without esophagitis: Secondary | ICD-10-CM | POA: Insufficient documentation

## 2014-06-01 DIAGNOSIS — Z7901 Long term (current) use of anticoagulants: Secondary | ICD-10-CM | POA: Insufficient documentation

## 2014-06-01 DIAGNOSIS — E7800 Pure hypercholesterolemia, unspecified: Secondary | ICD-10-CM | POA: Insufficient documentation

## 2014-06-01 DIAGNOSIS — I059 Rheumatic mitral valve disease, unspecified: Secondary | ICD-10-CM | POA: Insufficient documentation

## 2014-06-01 DIAGNOSIS — M549 Dorsalgia, unspecified: Secondary | ICD-10-CM | POA: Insufficient documentation

## 2014-06-01 DIAGNOSIS — B1920 Unspecified viral hepatitis C without hepatic coma: Secondary | ICD-10-CM | POA: Insufficient documentation

## 2014-06-01 DIAGNOSIS — N419 Inflammatory disease of prostate, unspecified: Secondary | ICD-10-CM | POA: Insufficient documentation

## 2014-06-01 DIAGNOSIS — I4892 Unspecified atrial flutter: Secondary | ICD-10-CM

## 2014-06-01 DIAGNOSIS — Z7982 Long term (current) use of aspirin: Secondary | ICD-10-CM | POA: Insufficient documentation

## 2014-06-01 DIAGNOSIS — Z5181 Encounter for therapeutic drug level monitoring: Secondary | ICD-10-CM

## 2014-06-01 HISTORY — PX: CARDIOVERSION: SHX1299

## 2014-06-01 LAB — POCT INR: INR: 3

## 2014-06-01 SURGERY — CARDIOVERSION
Anesthesia: General

## 2014-06-01 MED ORDER — PROPOFOL 10 MG/ML IV BOLUS
INTRAVENOUS | Status: DC | PRN
  Administered 2014-06-01: 60 mg via INTRAVENOUS

## 2014-06-01 MED ORDER — SODIUM CHLORIDE 0.9 % IV SOLN
250.0000 mL | INTRAVENOUS | Status: DC
  Administered 2014-06-01: 500 mL via INTRAVENOUS

## 2014-06-01 MED ORDER — SODIUM CHLORIDE 0.9 % IJ SOLN: 3.0000 mL | INTRAMUSCULAR | Status: DC | PRN

## 2014-06-01 MED ORDER — SODIUM CHLORIDE 0.9 % IJ SOLN: 3.0000 mL | Freq: Two times a day (BID) | INTRAMUSCULAR | Status: DC

## 2014-06-01 MED ORDER — LIDOCAINE HCL (CARDIAC) 20 MG/ML IV SOLN
INTRAVENOUS | Status: DC | PRN
  Administered 2014-06-01: 20 mg via INTRAVENOUS

## 2014-06-01 NOTE — H&P (Signed)
[name], PA-C Physician Assistant Signed Cardiology H&P Service date: 05/22/2014 5:38 PM    History and Physical  Date: 05/22/2014  ID: Bradley CocoKatie D Hansen, DOB [birth year], MRN [id number]  PCP: Bradley Hansen, [name], MD  Cardiologist: Dr. Rollene RotundaJames Hochrein  History of Present Illness:  Bradley Hansen is a 63 y.o. male with a hx of MVP with assoc MR. He had been evaluated by [name] in 2012 4 consideration of mitral valve repair. He was asymptomatic at that time with preserved EF and was followed clinically. He was recently seen back with symptoms felt to be attributable to his mitral regurgitation. He underwent cardiac catheterization which demonstrated no significant CAD. He was referred back to surgery and was admitted 4/30-5/7. He underwent minimally invasive mitral valve repair with [name] on 04/26/14. He did have postoperative atrial fibrillation. He converted to NSR on amiodarone. Amiodarone was discontinued secondary to junctional rhythm. He did require antibiotics for treatment of presumed tracheobronchitis. Otherwise, his postoperative course was fairly uneventful. He returns for follow up.  He has been doing well. Still has some chest soreness. He denies dyspnea, orthopnea, PND, edema. He denies syncope.  Studies:  - LHC (01/2014): No significant CAD (Mid LAD 25%), EF 60%  - Echo (01/2014): EF 55-60%, normal wall motion, severe holosystolic prolapse of the mitral valve anterior leaflet, moderate to severe MR directed posteriorly, moderate to severe LAE, PASP 31 mmHg  - Carotid US (03/2014): Bilateral ICA 1-39%  Recent Labs:  04/23/2014: ALT 18  04/30/2014: Creatinine 0.86; Potassium 4.4  05/01/2014: Hemoglobin 8.2*    Wt Readings from Last 3 Encounters:    05/22/14  179 lb (81.194 kg)    05/07/14  150 lb (68.04 kg)    05/03/14  151 lb 7.3 oz (68.7 kg)    Past Medical History    Diagnosis  Date    .  Mitral valve prolapse       TEE 08/2010 severe MR, bileaflet MVP, EF 60%    .  Hypercholesteremia     .   Synovitis of hand  2011      right hand    .  Leukopenia     .  Rhinitis  2011      chronic    .  Hepatitis C  2004      pt doesn't want treatment    .  Fibromyalgia     .  Prostatitis     .  Prostadynia     .  Mitral regurgitation     .  MITRAL VALVE PROLAPSE  02/24/2007      Qualifier: Diagnosis of By: [name], [name]    .  GASTROESOPHAGEAL REFLUX, NO ESOPHAGITIS  02/24/2007      Qualifier: Diagnosis of By: [name], [name]    .  HEPATITIS C  02/24/2007      Qualifier: Diagnosis of By: [name], [name]    .  BACK PAIN, CHRONIC  12/11/2009      Qualifier: Diagnosis of By: [name], [name]    .  S/P minimally invasive mitral valve repair  04/26/2014      Complex valvuloplasty including artificial Gore-tex neocord placement x8, bovine pericardial patch augmentation of P3 portion of posterior leaflet and 36 mm Sorin Memo 3D ring annuloplasty via right mini thoracotomy    Current Outpatient Prescriptions    Medication  Sig  Dispense  Refill    .  aspirin EC 81 MG EC tablet  Take 1 tablet (81 mg total)  by mouth daily.      Marland Kitchen  esomeprazole (NEXIUM) 40 MG capsule  Take 40 mg by mouth daily as needed (reflux).      .  furosemide (LASIX) 40 MG tablet  Take 40 mg by mouth as needed.      Marland Kitchen  KLOR-CON M20 20 MEQ tablet  Take 20 mEq by mouth as needed.      Marland Kitchen  levofloxacin (LEVAQUIN) 750 MG tablet  Take 1 tablet (750 mg total) by mouth daily.  8 tablet  0    .  metoprolol tartrate (LOPRESSOR) 12.5 mg TABS tablet  Take 0.5 tablets (12.5 mg total) by mouth 2 (two) times daily.  60 each  1    .  oxyCODONE (OXY IR/ROXICODONE) 5 MG immediate release tablet  Take 1-2 tablets (5-10 mg total) by mouth every 4 (four) hours as needed for moderate pain.  50 tablet  0    .  warfarin (COUMADIN) 2.5 MG tablet  Take 1 tablet (2.5 mg total) by mouth daily at 6 PM.  100 tablet  1    No current facility-administered medications for this visit.     Allergies: Review of patient's allergies indicates no known  allergies.  Social History: The patient reports that he has never smoked. He has never used smokeless tobacco. He reports that he drinks alcohol. He reports that he does not use illicit drugs.  Family History: The patient's family history is negative for Heart attack, Cancer, and Stroke.  ROS: Please see the history of present illness. He denies any bleeding problems. All other systems reviewed and negative.  PHYSICAL EXAM:  VS: BP 146/64  Pulse 89  Ht 5\' 7"  (1.702 m)  Wt 179 lb (81.194 kg)  BMI 28.03 kg/m2  Well nourished, well developed, in no acute distress  HEENT: normal  Neck: no JVD  Cardiac: normal S1, S2; irreg irreg rhythm; no murmur  Chest: R chest incision well healed  Lungs: clear to auscultation bilaterally, no wheezing, rhonchi or rales  Abd: soft, nontender, no hepatomegaly  Ext: no edema  Skin: warm and dry  Neuro: CNs 2-12 intact, no focal abnormalities noted  EKG: AFlutter, HR 89, variable AVB  ASSESSMENT AND PLAN:  1. Atrial flutter: He is now in AFlutter with controlled rate. He is asymptomatic. He has been on coumadin and INR has been therapeutic since 05/07/14. I reviewed his case with Dr. Rollene Rotunda. We will check his INR today and again next week. If his INR remains therapeutic, we will plan on DCCV next week (3 weeks of therapeutic INR). Continue metoprolol 12.5 bid.  2. Mitral regurgitation S/P minimally invasive mitral valve repair: Doing well after recent MV repair. He understands the importance of SBE prophylaxis. F/u with Dr. Cornelius Moras as planned. Obtain follow up echo. 3. Disposition: F/u with me or Dr. Rollene Rotunda in 3 weeks.  Signed,  [name], PA-C, MHS    For DCCV. INR has been therapeutic. No changes. [name]

## 2014-06-01 NOTE — Procedures (Signed)
Electrical Cardioversion Procedure Note Bradley Hansen [id number] 03/14/1951  Procedure: Electrical Cardioversion Indications:  Atrial Flutter  Procedure Details Consent: Risks of procedure as well as the alternatives and risks of each were explained to the (patient/caregiver).  Consent for procedure obtained. Time Out: Verified patient identification, verified procedure, site/side was marked, verified correct patient position, special equipment/implants available, medications/allergies/relevent history reviewed, required imaging and test results available.  Performed  Patient placed on cardiac monitor, pulse oximetry, supplemental oxygen as necessary.  Sedation given: Patient sedated by anesthesia with lidocaine 20 mg and diprovan 60 mg IV. Pacer pads placed anterior and posterior chest.  Cardioverted 1 time(s).  Cardioverted at 120J.  Evaluation Findings: Post procedure EKG shows: NSR Complications: None Patient did tolerate procedure well.   Bradley Hansen 06/01/2014, 9:52 AM

## 2014-06-01 NOTE — Discharge Instructions (Signed)
Electrical Cardioversion, Care After °Refer to this sheet in the next few weeks. These instructions provide you with information on caring for yourself after your procedure. Your health care provider may also give you more specific instructions. Your treatment has been planned according to current medical practices, but problems sometimes occur. Call your health care provider if you have any problems or questions after your procedure. °WHAT TO EXPECT AFTER THE PROCEDURE °After your procedure, it is typical to have the following sensations: °· Some redness on the skin where the shocks were delivered. If this is tender, a sunburn lotion or hydrocortisone cream may help. °· Possible return of an abnormal heart rhythm within hours or days after the procedure. °HOME CARE INSTRUCTIONS °· Only take medicine as directed by your health care provider. Be sure you understand how and when to take your medicine. °· Learn how to feel your pulse and check it often. °· Limit your activity for 48 hours after the procedure or as directed. °· Avoid or minimize caffeine and other stimulants as directed. °SEEK MEDICAL CARE IF: °· You feel like your heart is beating too fast or your pulse is not regular. °· You have any questions about your medicines. °· You have bleeding that will not stop. °SEEK IMMEDIATE MEDICAL CARE IF: °· You are dizzy or feel faint. °· It is hard to breathe or you feel short of breath. °· There is a change in discomfort in your chest. °· Your speech is slurred or you have trouble moving an arm or leg on one side of your body. °· You get a serious muscle cramp that does not go away. °· Your fingers or toes turn cold or blue. °MAKE SURE YOU:  °· Understand these instructions.   °· Will watch your condition.   °· Will get help right away if you are not doing well or get worse. °Document Released: 10/04/2013 Document Reviewed: 06/28/2013 °[employer]® Patient Information ©2014 [employer]. ° °

## 2014-06-01 NOTE — Anesthesia Preprocedure Evaluation (Addendum)
Anesthesia Evaluation  Patient identified by MRN, date of birth, ID band Patient awake    Reviewed: Allergy & Precautions, H&P , NPO status , Patient's Chart, lab work & pertinent test results  History of Anesthesia Complications Negative for: history of anesthetic complications  Airway Mallampati: II TM Distance: >3 FB Neck ROM: Full    Dental  (+) Teeth Intact, Dental Advisory Given   Pulmonary neg pulmonary ROS,  breath sounds clear to auscultation  Pulmonary exam normal       Cardiovascular + dysrhythmias Atrial Fibrillation + Valvular Problems/Murmurs MVP Rhythm:Irregular Rate:Tachycardia  S/p mitral valve repair 04/26/14   Neuro/Psych negative neurological ROS     GI/Hepatic GERD-  Medicated and Controlled,(+) Hepatitis -, C  Endo/Other  negative endocrine ROS  Renal/GU      Musculoskeletal  (+) Fibromyalgia -  Abdominal Normal abdominal exam  (+)   Peds  Hematology   Anesthesia Other Findings   Reproductive/Obstetrics                         Anesthesia Physical Anesthesia Plan  ASA: III  Anesthesia Plan: General   Post-op Pain Management:    Induction: Intravenous  Airway Management Planned: Mask  Additional Equipment:   Intra-op Plan:   Post-operative Plan:   Informed Consent: I have reviewed the patients History and Physical, chart, labs and discussed the procedure including the risks, benefits and alternatives for the proposed anesthesia with the patient or authorized representative who has indicated his/her understanding and acceptance.   Dental advisory given  Plan Discussed with: Anesthesiologist and Surgeon  Anesthesia Plan Comments:         Anesthesia Quick Evaluation

## 2014-06-01 NOTE — Anesthesia Postprocedure Evaluation (Signed)
  Anesthesia Post-op Note  Patient: Bradley Hansen  Procedure(s) Performed: Procedure(s): CARDIOVERSION (N/A)  Patient Location: Endoscopy Unit  Anesthesia Type:General  Level of Consciousness: awake, alert  and oriented  Airway and Oxygen Therapy: Patient Spontanous Breathing  Post-op Pain: none  Post-op Assessment: Post-op Vital signs reviewed, Patient's Cardiovascular Status Stable, Respiratory Function Stable, Patent Airway and No signs of Nausea or vomiting  Post-op Vital Signs: Reviewed and stable  Last Vitals:  Filed Vitals:   06/01/14 0928  Pulse: 129  Temp: 37.1 C  Resp: 16    Complications: No apparent anesthesia complications

## 2014-06-01 NOTE — Anesthesia Postprocedure Evaluation (Signed)
  Anesthesia Post-op Note  Patient: Bradley Hansen  Procedure(s) Performed: Procedure(s): CARDIOVERSION (N/A)  Patient Location: Endoscopy Unit  Anesthesia Type:General  Level of Consciousness: awake, alert  and oriented  Airway and Oxygen Therapy: Patient Spontanous Breathing  Post-op Pain: none  Post-op Assessment: Post-op Vital signs reviewed, Patient's Cardiovascular Status Stable, Respiratory Function Stable, Patent Airway and Pain level controlled  Post-op Vital Signs: stable  Last Vitals:  Filed Vitals:   06/01/14 1126  Pulse:   Temp: 36.9 C  Resp:     Complications: No apparent anesthesia complications

## 2014-06-01 NOTE — Transfer of Care (Signed)
Immediate Anesthesia Transfer of Care Note  Patient: Bradley Hansen  Procedure(s) Performed: Procedure(s): CARDIOVERSION (N/A)  Patient Location: Endoscopy Unit  Anesthesia Type:General  Level of Consciousness: awake, alert  and oriented  Airway & Oxygen Therapy: Patient Spontanous Breathing and Patient connected to nasal cannula oxygen  Post-op Assessment: Report given to PACU RN and Post -op Vital signs reviewed and stable  Post vital signs: Reviewed and stable  Complications: No apparent anesthesia complications

## 2014-06-04 ENCOUNTER — Encounter (HOSPITAL_COMMUNITY): Payer: Self-pay | Admitting: Cardiology

## 2014-06-04 ENCOUNTER — Ambulatory Visit (INDEPENDENT_AMBULATORY_CARE_PROVIDER_SITE_OTHER): Payer: Self-pay | Admitting: Thoracic Surgery (Cardiothoracic Vascular Surgery)

## 2014-06-04 VITALS — BP 112/74 | HR 112 | Resp 16 | Ht 67.0 in | Wt 155.5 lb

## 2014-06-04 DIAGNOSIS — Z9889 Other specified postprocedural states: Secondary | ICD-10-CM

## 2014-06-04 DIAGNOSIS — I4892 Unspecified atrial flutter: Secondary | ICD-10-CM | POA: Insufficient documentation

## 2014-06-04 DIAGNOSIS — I340 Nonrheumatic mitral (valve) insufficiency: Secondary | ICD-10-CM

## 2014-06-04 DIAGNOSIS — I059 Rheumatic mitral valve disease, unspecified: Secondary | ICD-10-CM

## 2014-06-04 DIAGNOSIS — I341 Nonrheumatic mitral (valve) prolapse: Secondary | ICD-10-CM

## 2014-06-04 HISTORY — DX: Unspecified atrial flutter: I48.92

## 2014-06-04 MED ORDER — AMIODARONE HCL 200 MG PO TABS: 200.0000 mg | ORAL_TABLET | Freq: Two times a day (BID) | ORAL | Status: DC

## 2014-06-04 NOTE — Progress Notes (Signed)
      [address]       Bradley Hansen [address]             [phone]     CARDIOTHORACIC SURGERY OFFICE NOTE  Referring Provider is Rollene Rotunda, MD PCP is [name], MD   HPI:  Patient returns for routine followup status post minimally invasive mitral valve repair on 04/26/2014.  His postoperative recovery has been uncomplicated. While he remained in the hospital he had some transient atrial fibrillation with slow ventricular response.  He converted to junctional rhythm on amiodarone.  Sinus node dysfunction persisted, so he was discharged without being on either a beta blocker or amiodarone. He was last seen here in our office on 05/07/2014 at which time he was doing well. He was seen by [name] on 05/22/2014 at which time he was noted to be in atrial flutter. He underwent DC cardioversion by Dr. Jens Som 3 days ago without complication. He returns to our office for followup today. He reports that he feels well although he still has an intermittent nonproductive cough. He denies any shortness of breath. He has not had any tachypalpitations or dizzy spells. He has very mild residual soreness in his chest and this really bothers him only when he coughs.   Current Outpatient Prescriptions  Medication Sig Dispense Refill  . aspirin EC 81 MG EC tablet Take 1 tablet (81 mg total) by mouth daily.      Marland Kitchen esomeprazole (NEXIUM) 40 MG capsule Take 40 mg by mouth daily as needed (reflux).       . warfarin (COUMADIN) 2.5 MG tablet Take 1 tablet (2.5 mg total) by mouth daily at 6 PM.  100 tablet  1   No current facility-administered medications for this visit.      Physical Exam:   BP 112/74  Pulse 112  Resp 16  Ht 5\' 7"  (1.702 m)  Wt 155 lb 8 oz (70.534 kg)  BMI 24.35 kg/m2  SpO2 98%  General:  Well-appearing  Chest:   Clear to auscultation  CV:   Elevated heart rate but regular rhythm, no murmur noted  Incisions:  Clean and dry and healing  nicely  Abdomen:  Soft and nontender  Extremities:  Warm and well-perfused  Diagnostic Tests:  2 channel telemetry rhythm strip demonstrates what appears to be either sinus tachycardia or more likely atrial flutter with heart rate 110   Impression:  The patient appears to have gone back into atrial flutter after having undergone DC cardioversion 3 days ago. He is otherwise recovering nicely following recent minimally invasive mitral valve repair. He has been therapeutic on Coumadin.    Plan:  Will resume oral amiodarone 200 mg twice daily. The patient has been instructed to stop by the Coumadin clinic to notify them that he has been started on amiodarone to inquire as to whether or not to adjust his Coumadin dose. He has been instructed to stop taking aspirin now that he is therapeutic on Coumadin. He plans to followup in Dr. Jenene Slicker office next Monday. He will return for followup and rhythm check here in our office in 4 weeks. All of his questions been addressed.    Salvatore Decent. Cornelius Moras, MD 06/04/2014 3:17 PM

## 2014-06-04 NOTE — Patient Instructions (Addendum)
Stop taking aspirin  Begin taking amiodarone 1 tablet twice daily with food.  Call the coumadin clinic and let them know you have been started on amiodarone.  Follow up with Dr Antoine Poche in 1 week

## 2014-06-11 ENCOUNTER — Ambulatory Visit (INDEPENDENT_AMBULATORY_CARE_PROVIDER_SITE_OTHER): Payer: BC Managed Care – PPO | Admitting: *Deleted

## 2014-06-11 ENCOUNTER — Encounter: Payer: Self-pay | Admitting: Physician Assistant

## 2014-06-11 ENCOUNTER — Other Ambulatory Visit (INDEPENDENT_AMBULATORY_CARE_PROVIDER_SITE_OTHER): Payer: BC Managed Care – PPO

## 2014-06-11 ENCOUNTER — Ambulatory Visit (INDEPENDENT_AMBULATORY_CARE_PROVIDER_SITE_OTHER): Payer: BC Managed Care – PPO | Admitting: Physician Assistant

## 2014-06-11 VITALS — BP 140/90 | HR 100 | Ht 67.0 in | Wt 157.0 lb

## 2014-06-11 DIAGNOSIS — Z5181 Encounter for therapeutic drug level monitoring: Secondary | ICD-10-CM

## 2014-06-11 DIAGNOSIS — R05 Cough: Secondary | ICD-10-CM

## 2014-06-11 DIAGNOSIS — Z9889 Other specified postprocedural states: Secondary | ICD-10-CM

## 2014-06-11 DIAGNOSIS — R059 Cough, unspecified: Secondary | ICD-10-CM

## 2014-06-11 DIAGNOSIS — R0602 Shortness of breath: Secondary | ICD-10-CM

## 2014-06-11 DIAGNOSIS — I059 Rheumatic mitral valve disease, unspecified: Secondary | ICD-10-CM

## 2014-06-11 DIAGNOSIS — I4892 Unspecified atrial flutter: Secondary | ICD-10-CM

## 2014-06-11 DIAGNOSIS — I340 Nonrheumatic mitral (valve) insufficiency: Secondary | ICD-10-CM

## 2014-06-11 DIAGNOSIS — I519 Heart disease, unspecified: Secondary | ICD-10-CM

## 2014-06-11 LAB — CBC WITH DIFFERENTIAL/PLATELET
Basophils Absolute: 0 10*3/uL (ref 0.0–0.1)
Basophils Relative: 0 % (ref 0.0–3.0)
Eosinophils Absolute: 0 10*3/uL (ref 0.0–0.7)
Eosinophils Relative: 0.6 % (ref 0.0–5.0)
HCT: 30.5 % — ABNORMAL LOW (ref 39.0–52.0)
Hemoglobin: 9.6 g/dL — ABNORMAL LOW (ref 13.0–17.0)
Lymphocytes Relative: 22.1 % (ref 12.0–46.0)
Lymphs Abs: 0.9 10*3/uL (ref 0.7–4.0)
MCHC: 31.5 g/dL (ref 30.0–36.0)
MCV: 84.8 fl (ref 78.0–100.0)
Monocytes Absolute: 0.3 10*3/uL (ref 0.1–1.0)
Monocytes Relative: 8 % (ref 3.0–12.0)
Neutro Abs: 2.9 10*3/uL (ref 1.4–7.7)
Neutrophils Relative %: 69.3 % (ref 43.0–77.0)
Platelets: 288 10*3/uL (ref 150.0–400.0)
RBC: 3.59 Mil/uL — ABNORMAL LOW (ref 4.22–5.81)
RDW: 15.4 % (ref 11.5–15.5)
WBC: 4.2 10*3/uL (ref 4.0–10.5)

## 2014-06-11 LAB — POCT INR: INR: 3

## 2014-06-11 LAB — BASIC METABOLIC PANEL
BUN: 6 mg/dL (ref 6–23)
CO2: 26 mEq/L (ref 19–32)
Calcium: 8.9 mg/dL (ref 8.4–10.5)
Chloride: 105 mEq/L (ref 96–112)
Creatinine, Ser: 0.8 mg/dL (ref 0.4–1.5)
GFR: 119.35 mL/min (ref >60.00)
Glucose, Bld: 117 mg/dL — ABNORMAL HIGH (ref 70–99)
Potassium: 3.7 mEq/L (ref 3.5–5.1)
Sodium: 135 mEq/L (ref 135–145)

## 2014-06-11 LAB — BRAIN NATRIURETIC PEPTIDE: Pro B Natriuretic peptide (BNP): 1989 pg/mL — ABNORMAL HIGH (ref 0.0–100.0)

## 2014-06-11 MED ORDER — LOSARTAN POTASSIUM 25 MG PO TABS: 25.0000 mg | ORAL_TABLET | Freq: Every day | ORAL | Status: DC

## 2014-06-11 MED ORDER — BENZONATATE 100 MG PO CAPS: 100.0000 mg | ORAL_CAPSULE | Freq: Three times a day (TID) | ORAL | Status: DC | PRN

## 2014-06-11 MED ORDER — AMIODARONE HCL 200 MG PO TABS: 200.0000 mg | ORAL_TABLET | Freq: Two times a day (BID) | ORAL | Status: DC

## 2014-06-11 NOTE — Progress Notes (Signed)
Cardiology Office Note   Date:  06/11/2014   ID:  Bradley CocoKatie D Cerrato, DOB 26-Jun-1951, MRN [id number]  PCP:  [name], [name], MD  Cardiologist:  Dr. Rollene RotundaJames Hochrein      History of Present Illness: Bradley Hansen is a 63 y.o. male with a hx of MVP with assoc MR.  He underwent minimally invasive mitral valve repair with [name] on 04/26/14. He did have postoperative atrial fibrillation. He converted to NSR on amiodarone. Amiodarone was discontinued secondary to junctional rhythm. He did require antibiotics for treatment of presumed tracheobronchitis.  I saw him in followup 05/22/14. He was in atrial flutter with controlled rate. I reviewed his case with Dr. [name] and we set him up for elective cardioversion. This restored NSR. He was seen by [name] in followup after his cardioversion and noted to be back in atrial flutter. He was placed on amiodarone. Of note, the patient had stopped taking the metoprolol at some point. He returns today back in NSR. His biggest complaint is cough. He states he often feels somewhat slightly short of breath with talking and then develops a cough. This is productive of clear sputum. He denies orthopnea, PND or edema. He denies syncope. He denies significant chest discomfort. He denies fevers.   Studies:  - LHC (01/2014):  No significant CAD (Mid LAD 25%), EF 60%  - Echo (01/2014):  EF 55-60%, normal wall motion, severe holosystolic prolapse of the mitral valve anterior leaflet, moderate to severe MR directed posteriorly, moderate to severe LAE, PASP 31 mmHg  - Echo (05/23/14):  EF 45% to 50%. Diffuse HK.  Ventricular septum: Septal motion showed paradox.  Mild AI, MV repair okay, moderate LAE, mild RAE, mild late systolic prolapse of the tricuspid valve  - Carotid US (03/2014):  Bilateral ICA 1-39%   Recent Labs: 04/23/2014: ALT 18  04/30/2014: Creatinine 0.86; Potassium 4.4  05/01/2014: Hemoglobin 8.2*   Wt Readings from Last 3 Encounters:  06/11/14 157 lb (71.215 kg)    06/04/14 155 lb 8 oz (70.534 kg)  05/22/14 179 lb (81.194 kg)     Past Medical History  Diagnosis Date  . Mitral valve prolapse     TEE  08/2010 severe MR, bileaflet MVP, EF 60%  . Hypercholesteremia   . Synovitis of hand 2011    right hand  . Leukopenia   . Rhinitis 2011    chronic   . Hepatitis C 2004    pt doesn't want treatment  . Fibromyalgia   . Prostatitis   . Prostadynia   . Mitral regurgitation   . MITRAL VALVE PROLAPSE 02/24/2007    Qualifier: Diagnosis of  By: [name], [name]    . GASTROESOPHAGEAL REFLUX, NO ESOPHAGITIS 02/24/2007    Qualifier: Diagnosis of  By: [name], [name]    . HEPATITIS C 02/24/2007    Qualifier: Diagnosis of  By: [name], [name]    . BACK PAIN, CHRONIC 12/11/2009    Qualifier: Diagnosis of  By: [name], [name]    . S/P minimally invasive mitral valve repair 04/26/2014    Complex valvuloplasty including artificial Gore-tex neocord placement x8, bovine pericardial patch augmentation of P3 portion of posterior leaflet and 36 mm Sorin Memo 3D ring annuloplasty via right mini thoracotomy  . Hx of echocardiogram     Echo (04/2014):  EF 45-50%, diff HK, mild AI, MV repair ok (mean 5 mmHg), mod LAE, mild RAE, mild late TV systolic prolapse  . Atrial flutter, post operative 06/04/2014  Current Outpatient Prescriptions  Medication Sig Dispense Refill  . amiodarone (PACERONE) 200 MG tablet Take 1 tablet (200 mg total) by mouth 2 (two) times daily.  60 tablet  0  . esomeprazole (NEXIUM) 40 MG capsule Take 40 mg by mouth daily as needed (reflux).       . warfarin (COUMADIN) 2.5 MG tablet Take 1 tablet (2.5 mg total) by mouth daily at 6 PM.  100 tablet  1   No current facility-administered medications for this visit.    Allergies:   Review of patient's allergies indicates no known allergies.   Social History:  The patient  reports that he has never smoked. He has never used smokeless tobacco. He reports that he drinks alcohol. He reports that he  does not use illicit drugs.   Family History:  The patient's family history is negative for Heart attack, Cancer, and Stroke.   ROS:  Please see the history of present illness.   He denies any bleeding problems.    All other systems reviewed and negative.   PHYSICAL EXAM: VS:  BP 140/90  Pulse 100  Ht 5\' 7"  (1.702 m)  Wt 157 lb (71.215 kg)  BMI 24.58 kg/m2 Well nourished, well developed, in no acute distress HEENT: normal Neck: no JVD Cardiac:  normal S1, S2; RRR no murmur? S3 versus midsystolic click Lungs:  Decreased breath sounds bilaterally, no wheezing, rhonchi or rales Abd: soft, nontender, no hepatomegaly Ext: no edema Skin: warm and dry Neuro:  CNs 2-12 intact, no focal abnormalities noted  EKG:  Sinus tachycardia, HR 100, nonspecific ST-T wave changes     ASSESSMENT AND PLAN:  1. Atrial flutter:  He is back in NSR. Heart rate is somewhat elevated. He has had mild depression of his LV function after his mitral valve repair. I reviewed his case today with Dr. Katrinka Blazing (DOD). Given his history of junctional rhythm in the hospital on amiodarone, we decided to hold off on resuming his beta blocker for now. His amiodarone will likely be discontinued in the next couple of months. At that point, we can consider placing him back on beta blocker therapy. Continue Coumadin. 2. LV Dysfunction: As noted, he said mildly reduced LV dysfunction since his mitral valve repair. I will start him on low-dose losartan 25 mg daily. Check a basic metabolic panel in one week.  Consider adding beta blocker back in the future once off Amiodarone. 3. Cough: Etiology is not entirely clear. He has had some symptoms of dyspnea at times. He does not look particularly volume overloaded on exam and his lungs are clear. I will obtain a chest x-ray, CBC, basic metabolic panel and BNP. I will give him Tessalon Perles 100 mg 3 times a day as needed for cough. I have also suggested that he use over-the-counter Claritin  should allergic rhinitis be contributing to his symptoms. 4. Mitral regurgitation S/P minimally invasive mitral valve repair:  Recent echo with stable mitral valve repair.  He understands the importance of SBE prophylaxis.  F/u with Dr. Cornelius Moras as planned.   5. Disposition:  F/u with Dr. Rollene Rotunda in one month.     Signed, Brynda Rim, MHS 06/11/2014 12:22 PM    [facility] [address] [place], [place], [place]  [address] Phone: [phone]; Fax: [phone]

## 2014-06-11 NOTE — Patient Instructions (Signed)
START LOSARTAN 25 MG DAILY  REFILL SENT IN FOR AMIODARONE  START TESSALON PEARLES 100 MG 1 CAP THREE TIMES DAILY AS NEEDED FOR COUGH  TRY OTC CLARITIN 10 MG AS NEEDED, RUNNY NOSE ETC.  LAB WORK AND A CHEST X-RAY TODAY; BMET, CBC W/DIFF, BNP AT THE [facility]  YOU WILL NEED A REPEAT BMET IN 2 WEEKS DUE TO MEDICATION CHANGES  YOU WILL NEED TO FOLLOW UP WITH DR. HOCHREIN IN 1 MONTH

## 2014-06-12 ENCOUNTER — Telehealth: Payer: Self-pay | Admitting: *Deleted

## 2014-06-12 DIAGNOSIS — R059 Cough, unspecified: Secondary | ICD-10-CM

## 2014-06-12 DIAGNOSIS — R05 Cough: Secondary | ICD-10-CM

## 2014-06-12 MED ORDER — FUROSEMIDE 40 MG PO TABS: 40.0000 mg | ORAL_TABLET | Freq: Every day | ORAL | Status: DC

## 2014-06-12 MED ORDER — POTASSIUM CHLORIDE CRYS ER 20 MEQ PO TBCR: 20.0000 meq | EXTENDED_RELEASE_TABLET | Freq: Every day | ORAL | Status: DC

## 2014-06-12 NOTE — Telephone Encounter (Signed)
I now returned pt's call for his lab results; lmptcb

## 2014-06-12 NOTE — Telephone Encounter (Signed)
pt notified about lab results and to start lasix 40 qd, K+ 20 qd. has appt 6/22 w/Dr. Antoine Poche. Pt said [facility] let him leave yesterday after they got blood work and never had pt get CXR. Pt said he cannot get CXR until 06/18/14. He can only do monday's.

## 2014-06-12 NOTE — Telephone Encounter (Signed)
lmptcb x 2 on both home and cell phone to go over lab results and med changes,

## 2014-06-18 ENCOUNTER — Encounter: Payer: Self-pay | Admitting: Cardiology

## 2014-06-18 ENCOUNTER — Telehealth: Payer: Self-pay | Admitting: *Deleted

## 2014-06-18 ENCOUNTER — Ambulatory Visit (INDEPENDENT_AMBULATORY_CARE_PROVIDER_SITE_OTHER): Payer: BC Managed Care – PPO | Admitting: *Deleted

## 2014-06-18 ENCOUNTER — Ambulatory Visit (INDEPENDENT_AMBULATORY_CARE_PROVIDER_SITE_OTHER): Payer: BC Managed Care – PPO | Admitting: Cardiology

## 2014-06-18 VITALS — BP 118/58 | HR 95 | Ht 67.0 in | Wt 155.0 lb

## 2014-06-18 DIAGNOSIS — Z9889 Other specified postprocedural states: Secondary | ICD-10-CM

## 2014-06-18 DIAGNOSIS — I340 Nonrheumatic mitral (valve) insufficiency: Secondary | ICD-10-CM

## 2014-06-18 DIAGNOSIS — I059 Rheumatic mitral valve disease, unspecified: Secondary | ICD-10-CM

## 2014-06-18 DIAGNOSIS — R05 Cough: Secondary | ICD-10-CM

## 2014-06-18 DIAGNOSIS — R059 Cough, unspecified: Secondary | ICD-10-CM

## 2014-06-18 DIAGNOSIS — I700 Atherosclerosis of aorta: Secondary | ICD-10-CM

## 2014-06-18 DIAGNOSIS — Z79899 Other long term (current) drug therapy: Secondary | ICD-10-CM

## 2014-06-18 DIAGNOSIS — Z5181 Encounter for therapeutic drug level monitoring: Secondary | ICD-10-CM

## 2014-06-18 DIAGNOSIS — I4891 Unspecified atrial fibrillation: Secondary | ICD-10-CM

## 2014-06-18 LAB — POCT INR: INR: 2.8

## 2014-06-18 LAB — HEPATIC FUNCTION PANEL
ALT: 24 U/L (ref 0–53)
AST: 23 U/L (ref 0–37)
Albumin: 3.5 g/dL (ref 3.5–5.2)
Alkaline Phosphatase: 51 U/L (ref 39–117)
Bilirubin, Direct: 0 mg/dL (ref 0.0–0.3)
Total Bilirubin: 0.7 mg/dL (ref 0.2–1.2)
Total Protein: 7.2 g/dL (ref 6.0–8.3)

## 2014-06-18 LAB — MAGNESIUM: Magnesium: 2 mg/dL (ref 1.5–2.5)

## 2014-06-18 LAB — BASIC METABOLIC PANEL
BUN: 11 mg/dL (ref 6–23)
CO2: 27 mEq/L (ref 19–32)
Calcium: 9.1 mg/dL (ref 8.4–10.5)
Chloride: 104 mEq/L (ref 96–112)
Creatinine, Ser: 0.9 mg/dL (ref 0.4–1.5)
GFR: 113.1 mL/min (ref >60.00)
Glucose, Bld: 103 mg/dL — ABNORMAL HIGH (ref 70–99)
Potassium: 3.8 mEq/L (ref 3.5–5.1)
Sodium: 137 mEq/L (ref 135–145)

## 2014-06-18 LAB — TSH: TSH: 1.2 u[IU]/mL (ref 0.35–4.50)

## 2014-06-18 MED ORDER — AMIODARONE HCL 200 MG PO TABS: 200.0000 mg | ORAL_TABLET | Freq: Every day | ORAL | Status: DC

## 2014-06-18 MED ORDER — FUROSEMIDE 20 MG PO TABS: 20.0000 mg | ORAL_TABLET | Freq: Every day | ORAL | Status: DC

## 2014-06-18 NOTE — Telephone Encounter (Signed)
Left message for pt to call back - he needs to stop his Amiodarone completely, repeat EKG in 1 week (has long Q-T) and his lab work from today needs to be reviewed with him.

## 2014-06-18 NOTE — Patient Instructions (Addendum)
Please decrease your Lasix to 20 mg a day. Decrease Amiodarone to 200 mg a day. Continue all other medications as listed.  Have blood work today (TSH, BMP, MG, Hepatic panel).  Follow up with Dr Antoine Poche in September at the [facility] office.

## 2014-06-18 NOTE — Progress Notes (Signed)
HPI The patient presents for followup of minimally invasive mitral valve repair by Dr. Cornelius Moras in April. He had postoperative atrial fibrillation and did have DCCV.  He was on amiodarone. This was discontinued secondary to bradycardia. However, followup with Dr. Cornelius Moras and he was noted to be back in atrial flutter. He was again placed on amiodarone.  He was back in sinus rhythm recently when he saw [place] in clinic.  He had a followup echocardiogram which demonstrated a mildly reduced ejection fraction but a stable mitral valve repair.  He reports that he has done well. He denies any chest pressure, neck or arm discomfort. He denies any palpitations, presyncope or syncope. He has had no PND or orthopnea. He says he feels very well and he is walking routinely. With this he has no symptoms.   No Known Allergies  Current Outpatient Prescriptions  Medication Sig Dispense Refill  . amiodarone (PACERONE) 200 MG tablet Take 1 tablet (200 mg total) by mouth 2 (two) times daily.  60 tablet  11  . benzonatate (TESSALON) 100 MG capsule Take 1 capsule (100 mg total) by mouth 3 (three) times daily as needed for cough.  30 capsule  0  . furosemide (LASIX) 40 MG tablet Take 1 tablet (40 mg total) by mouth daily with breakfast.  30 tablet  11  . losartan (COZAAR) 25 MG tablet Take 1 tablet (25 mg total) by mouth daily.  30 tablet  11  . potassium chloride SA (K-DUR,KLOR-CON) 20 MEQ tablet Take 1 tablet (20 mEq total) by mouth daily with breakfast.  30 tablet  11  . warfarin (COUMADIN) 2.5 MG tablet Take 1 tablet (2.5 mg total) by mouth daily at 6 PM.  100 tablet  1   No current facility-administered medications for this visit.    Past Medical History  Diagnosis Date  . Mitral valve prolapse     TEE  08/2010 severe MR, bileaflet MVP, EF 60%  . Hypercholesteremia   . Synovitis of hand 2011    right hand  . Leukopenia   . Rhinitis 2011    chronic   . Hepatitis C 2004    pt doesn't want treatment  .  Fibromyalgia   . Prostatitis   . Prostadynia   . Mitral regurgitation   . MITRAL VALVE PROLAPSE 02/24/2007    Qualifier: Diagnosis of  By: Abundio Miu    . GASTROESOPHAGEAL REFLUX, NO ESOPHAGITIS 02/24/2007    Qualifier: Diagnosis of  By: Abundio Miu    . HEPATITIS C 02/24/2007    Qualifier: Diagnosis of  By: Abundio Miu    . BACK PAIN, CHRONIC 12/11/2009    Qualifier: Diagnosis of  By: Mauricio Po MD, Fayrene Fearing    . S/P minimally invasive mitral valve repair 04/26/2014    Complex valvuloplasty including artificial Gore-tex neocord placement x8, bovine pericardial patch augmentation of P3 portion of posterior leaflet and 36 mm Sorin Memo 3D ring annuloplasty via right mini thoracotomy  . Hx of echocardiogram     Echo (04/2014):  EF 45-50%, diff HK, mild AI, MV repair ok (mean 5 mmHg), mod LAE, mild RAE, mild late TV systolic prolapse  . Atrial flutter, post operative 06/04/2014    Past Surgical History  Procedure Laterality Date  . Appendectomy    . Tee without cardioversion N/A 03/20/2013    Procedure: TRANSESOPHAGEAL ECHOCARDIOGRAM (TEE);  Surgeon: [name], MD;  Location: [facility] ENDOSCOPY;  Service: Cardiovascular;  Laterality: N/A;  . Mitral valve repair Right 04/26/2014  Procedure: MINIMALLY INVASIVE MITRAL VALVE REPAIR (MVR);  Surgeon: Purcell Nailslarence H Owen, MD;  [facility];  Service: Open Heart Surgery;  Laterality: Right;  . Intraoperative transesophageal echocardiogram N/A 04/26/2014    Procedure: INTRAOPERATIVE TRANSESOPHAGEAL ECHOCARDIOGRAM;  Surgeon: Purcell Nailslarence H Owen, MD;  [facility];  Service: Open Heart Surgery;  Laterality: N/A;  . Cardioversion N/A 06/01/2014    Procedure: CARDIOVERSION;  Surgeon: Lewayne BuntingBrian S Crenshaw, MD;  [facility];  Service: Cardiovascular;  Laterality: N/A;    ROS:   As stated in the HPI and negative for all other systems.  PHYSICAL EXAM BP 118/58  Pulse 95  Ht 5\' 7"  (1.702 m)  Wt 155 lb (70.308 kg)  BMI 24.27 kg/m2  SpO2  97% GENERAL:  Well appearing NECK:  No jugular venous distention, waveform within normal limits, carotid upstroke brisk and symmetric, no bruits, no thyromegaly LUNGS:  Clear to auscultation bilaterally BACK:  No CVA tenderness CHEST:  Unremarkable HEART:  PMI not displaced or sustained,S1 and S2 within normal limits, no S3, no S4, positive click, no rubs, harsh holosystolic murmur heard at the apex  ABD:  Flat, positive bowel sounds normal in frequency in pitch, no bruits, no rebound, no guarding, no midline pulsatile mass, no hepatomegaly, no splenomegaly EXT:  2 plus pulses throughout, no edema, no cyanosis no clubbing  EKG:  Sinus rhythm, rate 94, left axis deviation, QTC prolongation, deep lateral T-wave inversions new since previous. 06/18/2014  ASSESSMENT AND PLAN  ATRIAL FLUTTER:    He is maintaining NSR.  However, he now has a markedly abnormal EKG.  This is very atypical for a meal induced changes aside from the prolonged QT. However, I cannot otherwise explain T-wave inversion. I will check electrolytes. However, I think the better part of valor is to have him stop his amiodarone. He should come back in about one week for repeat EKG.   MITRAL VALVE REPAIR:  Recent echo demonstrated a stable repair. No further imaging is indicated.  COUGH:  This has resolved.  No change in therapy is indicated.   MILDLY REDUCED EF:   For now he will continue on the meds as listed with the change as above.  I will titrate beta blockers.  I will reduce his Lasix.

## 2014-06-19 NOTE — Telephone Encounter (Signed)
Follow up ° ° ° ° °Returning a nurses call from yesterday °

## 2014-06-19 NOTE — Telephone Encounter (Signed)
Returned call to patient advised to stop amiodarone.Appointment scheduled for a EKG 06/25/14 at 11:00 am.Lab results not available.Will call back after Dr.Hochrein reviews lab work.

## 2014-06-25 ENCOUNTER — Ambulatory Visit (INDEPENDENT_AMBULATORY_CARE_PROVIDER_SITE_OTHER): Payer: BC Managed Care – PPO | Admitting: *Deleted

## 2014-06-25 VITALS — BP 110/60 | HR 97 | Resp 12 | Wt 154.6 lb

## 2014-06-25 DIAGNOSIS — R9431 Abnormal electrocardiogram [ECG] [EKG]: Secondary | ICD-10-CM

## 2014-06-25 NOTE — Patient Instructions (Signed)
CONTINUE ON THE SAME MEDICINE  RETURN FOR EKG IN 10 DAYS

## 2014-06-25 NOTE — Progress Notes (Signed)
1.) Reason for visit: abnormal EKG  2.) Name of MD requesting visit: hochrein  3.) H&P: mitral valve repair, post op atrial flutter, amiodarone survallience  4.) ROS related to problem: abnormal EKG at last office visit, amiodarone stopped.             Pt has no complaints.  5.) Assessment and plan per MD: EKG reviewed with dr Delton See, QT has returned to normal.             T wave changes are still present. Pt has been off amiodarone since 06/18/14.              Per dr Delton See will continue the same and have the patient return in 10 days for repeat EKG.             Pt agreed with this plan. Follow up scheduled for EKG.

## 2014-07-02 ENCOUNTER — Ambulatory Visit (INDEPENDENT_AMBULATORY_CARE_PROVIDER_SITE_OTHER): Payer: BC Managed Care – PPO | Admitting: *Deleted

## 2014-07-02 ENCOUNTER — Encounter: Payer: Self-pay | Admitting: Thoracic Surgery (Cardiothoracic Vascular Surgery)

## 2014-07-02 ENCOUNTER — Ambulatory Visit (INDEPENDENT_AMBULATORY_CARE_PROVIDER_SITE_OTHER): Payer: Self-pay | Admitting: Thoracic Surgery (Cardiothoracic Vascular Surgery)

## 2014-07-02 VITALS — BP 122/79 | HR 105 | Resp 20 | Ht 67.0 in | Wt 155.0 lb

## 2014-07-02 VITALS — BP 112/64 | HR 102 | Ht 67.0 in | Wt 155.0 lb

## 2014-07-02 DIAGNOSIS — I340 Nonrheumatic mitral (valve) insufficiency: Secondary | ICD-10-CM

## 2014-07-02 DIAGNOSIS — Z9889 Other specified postprocedural states: Secondary | ICD-10-CM

## 2014-07-02 DIAGNOSIS — Z5181 Encounter for therapeutic drug level monitoring: Secondary | ICD-10-CM

## 2014-07-02 DIAGNOSIS — R9431 Abnormal electrocardiogram [ECG] [EKG]: Secondary | ICD-10-CM

## 2014-07-02 DIAGNOSIS — I059 Rheumatic mitral valve disease, unspecified: Secondary | ICD-10-CM

## 2014-07-02 DIAGNOSIS — I4892 Unspecified atrial flutter: Secondary | ICD-10-CM

## 2014-07-02 DIAGNOSIS — I341 Nonrheumatic mitral (valve) prolapse: Secondary | ICD-10-CM

## 2014-07-02 LAB — POCT INR: INR: 1.9

## 2014-07-02 NOTE — Patient Instructions (Signed)
The patient may return to driving an automobile as long as they are no longer requiring oral narcotic pain relievers during the daytime.  It would be wise to start driving only short distances during the daylight and gradually increase from there as they feel comfortable.  Patient may resume normal physical activity without any particular limitations at this time, and he may return to work.

## 2014-07-02 NOTE — Progress Notes (Signed)
1. Reason for visit:   Repeat EKG  2. Name of MD requesting visit:  Hochrein  3. H&P:   MVR, p/o A Fib, Amiodarone medication discontinued  4. ROS related to problem: Repeat EKG after stopping Amiodarone  5. Assessment and plan per MD:  EKG reviewed with Dr. Eldridge Dace (DOD), no change since prior EKG on 6/29. No orders.

## 2014-07-02 NOTE — Progress Notes (Signed)
[address]       [place] [address]             [phone]     CARDIOTHORACIC SURGERY OFFICE NOTE  Referring Provider is Rollene Rotunda, MD PCP is Shirlee Latch, MD   HPI:  Patient returns for routine followup status post minimally invasive mitral valve repair on 04/26/2014.  His postoperative recovery has been notable for recurrent atrial fibrillation and atrial flutter. He has been anticoagulated with coumadin and was seen most recently in our office on 06/04/2014 at which time he was in atria flutter.  He was restarted on amiodarone and seen the following week at Dr. Jenene Slicker office where he was notably back in sinus rhythm.  He was seen again the following week by Dr. Antoine Poche where her EKG revealed sinus rhythm with atypical T wave changes. Amiodarone was discontinued. Followup echocardiogram looked good with intact mitral valve repair and no mitral regurgitation. The patient returns for office for further followup today. He feels well. He has no significant pain in his chest. He has no shortness of breath. He is not having any tachypalpitations or dizzy spells. He feels ready to go back to work.    Current Outpatient Prescriptions  Medication Sig Dispense Refill  . furosemide (LASIX) 20 MG tablet Take 1 tablet (20 mg total) by mouth daily with breakfast.  30 tablet  11  . losartan (COZAAR) 25 MG tablet Take 1 tablet (25 mg total) by mouth daily.  30 tablet  11  . potassium chloride SA (K-DUR,KLOR-CON) 20 MEQ tablet Take 1 tablet (20 mEq total) by mouth daily with breakfast.  30 tablet  11  . warfarin (COUMADIN) 2.5 MG tablet Take 1 tablet (2.5 mg total) by mouth daily at 6 PM.  100 tablet  1   No current facility-administered medications for this visit.      Physical Exam:   BP 122/79  Pulse 105  Resp 20  Ht 5\' 7"  (1.702 m)  Wt 155 lb (70.308 kg)  BMI 24.27 kg/m2  SpO2 99%  General:  Well-appearing  Chest:   Clear to  auscultation  CV:   Regular rate and rhythm without murmur  Incisions:  Completely healed  Abdomen:  Soft and nontender  Extremities:  Warm and well-perfused  Diagnostic Tests:  2 channel telemetry rhythm strip demonstrates normal sinus rhythm   Transthoracic Echocardiography  Patient: Bradley Hansen, Bradley Hansen MR #: [id number] Study Date: 05/23/2014 Gender: M Age: 49 Height: 170.2 cm Weight: 81.2 kg BSA: 1.98 m^2 Pt. Status: Room:  REFERRING Rollene Rotunda SONOGRAPHER [place], Will Doreene Adas, Scott T REFERRING Alben Spittle, Scott T ATTENDING Thurmon Fair, MD PERFORMING [facility], Outpatient  cc:  ------------------------------------------------------------------- LV EF: 45% - 50%  ------------------------------------------------------------------- Indications: 424.0 Mitral valve disease.  ------------------------------------------------------------------- History: PMH: S/p MVR. Acquired from the patient and from the patient&'s chart. Atrial flutter. Mitral valve prolapse.  ------------------------------------------------------------------- Study Conclusions  - Left ventricle: The cavity size was normal. Wall thickness was normal. Systolic function was mildly reduced. The estimated ejection fraction was in the range of 45% to 50%. Diffuse hypokinesis. The study is not technically sufficient to allow evaluation of LV diastolic function. - Ventricular septum: Septal motion showed paradox. - Aortic valve: There was mild regurgitation. - Mitral valve: Prior procedures included surgical repair. An annular ring prosthesis was present. - Left atrium: The atrium was moderately dilated. - Right atrium: The atrium was mildly dilated. - Atrial septum: No defect or patent  foramen ovale was identified. - Tricuspid valve: Mild, late  systolicprolapse.  -------------------------------------------------------------------  ------------------------------------------------------------------- Left ventricle: The cavity size was normal. Wall thickness was normal. Systolic function was mildly reduced. The estimated ejection fraction was in the range of 45% to 50%. Diffuse hypokinesis. The study is not technically sufficient to allow evaluation of LV diastolic function.  ------------------------------------------------------------------- Aortic valve: Structurally normal valve. Trileaflet. Cusp separation was normal. Doppler: Transvalvular velocity was within the normal range. There was no stenosis. There was mild regurgitation.  ------------------------------------------------------------------- Aorta: Aortic root: The aortic root was normal in size. Ascending aorta: The ascending aorta was normal in size.  ------------------------------------------------------------------- Mitral valve: Moderately thickened leaflets . Moderate myxomatous degeneration. Prior procedures included surgical repair. An annular ring prosthesis was present. Gradients (measured at heart rate of 70 bpm) are mildly elevated due to annuloplasty. Doppler: There was no significant regurgitation. Valve area by pressure half-time: 3.61 cm^2. Indexed valve area by pressure half-time: 1.82 cm^2/m^2. Valve area by continuity equation (using LVOT flow): 2.02 cm^2. Indexed valve area by continuity equation (using LVOT flow): 1.02 cm^2/m^2. Mean gradient (D): 5 mm Hg. Peak gradient (D): 9 mm Hg.  ------------------------------------------------------------------- Left atrium: The atrium was moderately dilated.  ------------------------------------------------------------------- Atrial septum: No defect or patent foramen ovale was identified.  ------------------------------------------------------------------- Right ventricle: The cavity size was  normal. Wall thickness was normal. Systolic function was normal.  ------------------------------------------------------------------- Ventricular septum: Septal motion showed paradox.  ------------------------------------------------------------------- Pulmonic valve: Structurally normal valve. Cusp separation was normal. Doppler: Transvalvular velocity was within the normal range. There was trivial regurgitation.  ------------------------------------------------------------------- Tricuspid valve: Mild, late systolicprolapse. Doppler: There was trivial regurgitation.  ------------------------------------------------------------------- Right atrium: The atrium was mildly dilated.  ------------------------------------------------------------------- Pericardium: There was no pericardial effusion.  ------------------------------------------------------------------- Systemic veins: Inferior vena cava: The vessel was normal in size. The respirophasic diameter changes were in the normal range (>= 50%), consistent with normal central venous pressure.  ------------------------------------------------------------------- Prepared and Electronically Authenticated by  Thurmon FairMihai Croitoru, MD 2015-05-27T16:00:16     Impression:  Patient is doing well more than 2 months following minimally invasive mitral valve repair. He is now maintaining sinus rhythm. He remains on Coumadin. Late followup echocardiogram looks good.  Plan:  I think it is reasonable for the patient to resume driving an automobile and to return to work. Given the patient's issues with post operative atrial fibrillation and atrial flutter, we will defer the decision regarding termination of Coumadin therapy to Dr. [name] who plans to see the patient in followup in September.  The patient will return to our office for routine followup next April, approximately 1 year following his original surgery.   Salvatore Decentlarence H. Cornelius Moraswen,  MD 07/02/2014 1:38 PM

## 2014-07-02 NOTE — Patient Instructions (Signed)
Your physician recommends that you continue on your current medications as directed. Please refer to the Current Medication list given to you today.     

## 2014-07-16 ENCOUNTER — Ambulatory Visit (INDEPENDENT_AMBULATORY_CARE_PROVIDER_SITE_OTHER): Payer: BC Managed Care – PPO | Admitting: Pharmacist

## 2014-07-16 DIAGNOSIS — Z9889 Other specified postprocedural states: Secondary | ICD-10-CM

## 2014-07-16 DIAGNOSIS — I059 Rheumatic mitral valve disease, unspecified: Secondary | ICD-10-CM

## 2014-07-16 DIAGNOSIS — Z5181 Encounter for therapeutic drug level monitoring: Secondary | ICD-10-CM

## 2014-07-16 DIAGNOSIS — I340 Nonrheumatic mitral (valve) insufficiency: Secondary | ICD-10-CM

## 2014-07-16 LAB — POCT INR: INR: 2.7

## 2014-07-16 MED ORDER — WARFARIN SODIUM 2.5 MG PO TABS: 2.5000 mg | ORAL_TABLET | Freq: Every day | ORAL | Status: DC

## 2014-07-23 ENCOUNTER — Encounter: Payer: Self-pay | Admitting: Physician Assistant

## 2014-07-23 NOTE — Progress Notes (Signed)
Patient ID: Bradley Hansen, male   DOB: 02-13-1951, 63 y.o.   MRN: [id number]    This is an addendum to a previously dictated discharge summary by Gershon Crane PA- C from 05/01/2014.   The patient has been discharged on:   1.Beta Blocker:  Yes [ x  ]                              No   [   ]                              If No, reason:  2.Ace Inhibitor/ARB: Yes [   ]                                     No  [  x  ]                                     If No, reason: Borderline Hypertension  3.Statin:   Yes [   ]                  No  [ x  ]                  If No, reason: No CAD, Hyperlipidemia  4.Marlowe KaysCarlyle Basques  ]                  No   [   ]                  If No, reason:

## 2014-07-30 ENCOUNTER — Ambulatory Visit: Payer: BC Managed Care – PPO | Admitting: Cardiology

## 2014-08-06 ENCOUNTER — Ambulatory Visit (INDEPENDENT_AMBULATORY_CARE_PROVIDER_SITE_OTHER): Payer: BC Managed Care – PPO | Admitting: Pharmacist

## 2014-08-06 DIAGNOSIS — I340 Nonrheumatic mitral (valve) insufficiency: Secondary | ICD-10-CM

## 2014-08-06 DIAGNOSIS — Z9889 Other specified postprocedural states: Secondary | ICD-10-CM

## 2014-08-06 DIAGNOSIS — I059 Rheumatic mitral valve disease, unspecified: Secondary | ICD-10-CM

## 2014-08-06 DIAGNOSIS — Z5181 Encounter for therapeutic drug level monitoring: Secondary | ICD-10-CM

## 2014-08-06 LAB — POCT INR: INR: 1.7

## 2014-08-27 ENCOUNTER — Ambulatory Visit (INDEPENDENT_AMBULATORY_CARE_PROVIDER_SITE_OTHER): Payer: BC Managed Care – PPO | Admitting: Pharmacist

## 2014-08-27 DIAGNOSIS — I340 Nonrheumatic mitral (valve) insufficiency: Secondary | ICD-10-CM

## 2014-08-27 DIAGNOSIS — I059 Rheumatic mitral valve disease, unspecified: Secondary | ICD-10-CM

## 2014-08-27 DIAGNOSIS — Z9889 Other specified postprocedural states: Secondary | ICD-10-CM

## 2014-08-27 DIAGNOSIS — Z5181 Encounter for therapeutic drug level monitoring: Secondary | ICD-10-CM

## 2014-08-27 LAB — POCT INR: INR: 1.8

## 2014-08-28 ENCOUNTER — Ambulatory Visit: Payer: BC Managed Care – PPO | Admitting: Cardiology

## 2014-09-10 ENCOUNTER — Encounter: Payer: Self-pay | Admitting: Cardiology

## 2014-09-10 ENCOUNTER — Ambulatory Visit (INDEPENDENT_AMBULATORY_CARE_PROVIDER_SITE_OTHER): Payer: BC Managed Care – PPO | Admitting: Cardiology

## 2014-09-10 ENCOUNTER — Ambulatory Visit (INDEPENDENT_AMBULATORY_CARE_PROVIDER_SITE_OTHER): Payer: BC Managed Care – PPO | Admitting: Pharmacist Clinician (PhC)/ Clinical Pharmacy Specialist

## 2014-09-10 VITALS — BP 124/76 | HR 104 | Ht 67.0 in | Wt 160.1 lb

## 2014-09-10 DIAGNOSIS — Z5181 Encounter for therapeutic drug level monitoring: Secondary | ICD-10-CM

## 2014-09-10 DIAGNOSIS — Z9889 Other specified postprocedural states: Secondary | ICD-10-CM

## 2014-09-10 DIAGNOSIS — I059 Rheumatic mitral valve disease, unspecified: Secondary | ICD-10-CM

## 2014-09-10 DIAGNOSIS — I340 Nonrheumatic mitral (valve) insufficiency: Secondary | ICD-10-CM

## 2014-09-10 LAB — POCT INR: INR: 1.9

## 2014-09-10 NOTE — Progress Notes (Signed)
HPI The patient presents for followup of minimally invasive mitral valve repair by Dr. Cornelius Moras in April. He had postoperative atrial fibrillation and did have DCCV.  He was on amiodarone. This was discontinued secondary to bradycardia. However, followup with Dr. Cornelius Moras and he was noted to be back in atrial flutter. He was again placed on amiodarone.  He was back in sinus rhythm recently when he saw [place] in clinic.  He had a followup echocardiogram which demonstrated a mildly reduced ejection fraction but a stable mitral valve repair.  However, at the last appt he had a markedly abnormal EKG with QT prolongation and T wave inversion.  I stopped the amiodarone.    He reports that he has done well. He denies any chest pressure, neck or arm discomfort. He denies any palpitations, presyncope or syncope. He has had no PND or orthopnea. He says he feels very well and he is walking routinely. With this he has no symptoms.   No Known Allergies  Current Outpatient Prescriptions  Medication Sig Dispense Refill  . furosemide (LASIX) 20 MG tablet Take 1 tablet (20 mg total) by mouth daily with breakfast.  30 tablet  11  . losartan (COZAAR) 25 MG tablet Take 1 tablet (25 mg total) by mouth daily.  30 tablet  11  . potassium chloride SA (K-DUR,KLOR-CON) 20 MEQ tablet Take 1 tablet (20 mEq total) by mouth daily with breakfast.  30 tablet  11  . warfarin (COUMADIN) 2.5 MG tablet Take 1 tablet (2.5 mg total) by mouth daily at 6 PM.  100 tablet  0   No current facility-administered medications for this visit.    Past Medical History  Diagnosis Date  . Mitral valve prolapse     TEE  08/2010 severe MR, bileaflet MVP, EF 60%  . Hypercholesteremia   . Synovitis of hand 2011    right hand  . Leukopenia   . Rhinitis 2011    chronic   . Hepatitis C 2004    pt doesn't want treatment  . Fibromyalgia   . Prostatitis   . Prostadynia   . Mitral regurgitation   . MITRAL VALVE PROLAPSE 02/24/2007    Qualifier: Diagnosis  of  By: Abundio Miu    . GASTROESOPHAGEAL REFLUX, NO ESOPHAGITIS 02/24/2007    Qualifier: Diagnosis of  By: Abundio Miu    . HEPATITIS C 02/24/2007    Qualifier: Diagnosis of  By: Abundio Miu    . BACK PAIN, CHRONIC 12/11/2009    Qualifier: Diagnosis of  By: Mauricio Po MD, Fayrene Fearing    . S/P minimally invasive mitral valve repair 04/26/2014    Complex valvuloplasty including artificial Gore-tex neocord placement x8, bovine pericardial patch augmentation of P3 portion of posterior leaflet and 36 mm Sorin Memo 3D ring annuloplasty via right mini thoracotomy  . Hx of echocardiogram     Echo (04/2014):  EF 45-50%, diff HK, mild AI, MV repair ok (mean 5 mmHg), mod LAE, mild RAE, mild late TV systolic prolapse  . Atrial flutter, post operative 06/04/2014    Past Surgical History  Procedure Laterality Date  . Appendectomy    . Tee without cardioversion N/A 03/20/2013    Procedure: TRANSESOPHAGEAL ECHOCARDIOGRAM (TEE);  Surgeon: [name], MD;  Location: [facility] ENDOSCOPY;  Service: Cardiovascular;  Laterality: N/A;  . Mitral valve repair Right 04/26/2014    Procedure: MINIMALLY INVASIVE MITRAL VALVE REPAIR (MVR);  Surgeon: Purcell Nails, MD;  Location: [facility] OR;  Service: Open Heart Surgery;  Laterality: Right;  . Intraoperative transesophageal echocardiogram N/A 04/26/2014    Procedure: INTRAOPERATIVE TRANSESOPHAGEAL ECHOCARDIOGRAM;  Surgeon: Purcell Nails, MD;  Location: [facility] OR;  Service: Open Heart Surgery;  Laterality: N/A;  . Cardioversion N/A 06/01/2014    Procedure: CARDIOVERSION;  Surgeon: [name], MD;  Location: [facility] ENDOSCOPY;  Service: Cardiovascular;  Laterality: N/A;    ROS:   As stated in the HPI and negative for all other systems.  PHYSICAL EXAM BP 124/76  Pulse 104  Ht  (1.702 m)  Wt 160 lb 1.6 oz (72.621 kg)  BMI 25.07 kg/m2 GENERAL:  Well appearing NECK:  No jugular venous distention, waveform within normal limits, carotid upstroke brisk and symmetric, no  bruits, no thyromegaly LUNGS:  Clear to auscultation bilaterally BACK:  No CVA tenderness CHEST:  Unremarkable HEART:  PMI not displaced or sustained,S1 and S2 within normal limits, no S3, no S4, positive click, no rubs, harsh holosystolic murmur heard at the apex  ABD:  Flat, positive bowel sounds normal in frequency in pitch, no bruits, no rebound, no guarding, no midline pulsatile mass, no hepatomegaly, no splenomegaly EXT:  2 plus pulses throughout, no edema, no cyanosis no clubbing  EKG:  Sinus rhythm, rate 98,  QTC prolongation has resolved, deep lateral T-waves noted on the previous EKG have resolved.. 09/10/2014  ASSESSMENT AND PLAN  ATRIAL FLUTTER:    He seems to be maintaining sinus rhythm. His EKG has normalized since coming off the amiodarone. We will avoid this drug in the future. No further evaluation or changes in therapy is indicated. For now I would like him to stay on anticoagulation but may consider discontinuing this in the future if there are no further arrhythmias.  MITRAL VALVE REPAIR:  Recent echo demonstrated a stable repair.   I will see him back in April of next year to reassess his ejection fraction and valve repair.  MILDLY REDUCED EF:   For now he will continue on the meds as listed.

## 2014-09-10 NOTE — Patient Instructions (Addendum)
Your physician recommends that you schedule a follow-up appointment in:  April of 2016   We are ordering an echo for April of 2016

## 2014-09-11 NOTE — Addendum Note (Signed)
Addended by: Rubbie Battiest C. on: 09/11/2014 11:35 AM   Modules accepted: Orders

## 2014-10-01 ENCOUNTER — Ambulatory Visit (INDEPENDENT_AMBULATORY_CARE_PROVIDER_SITE_OTHER): Payer: BC Managed Care – PPO | Admitting: Pharmacist Clinician (PhC)/ Clinical Pharmacy Specialist

## 2014-10-01 DIAGNOSIS — Z5181 Encounter for therapeutic drug level monitoring: Secondary | ICD-10-CM

## 2014-10-01 DIAGNOSIS — Z9889 Other specified postprocedural states: Secondary | ICD-10-CM

## 2014-10-01 DIAGNOSIS — I340 Nonrheumatic mitral (valve) insufficiency: Secondary | ICD-10-CM

## 2014-10-01 LAB — POCT INR: INR: 1.8

## 2014-10-15 ENCOUNTER — Ambulatory Visit (INDEPENDENT_AMBULATORY_CARE_PROVIDER_SITE_OTHER): Payer: BC Managed Care – PPO | Admitting: Pharmacist Clinician (PhC)/ Clinical Pharmacy Specialist

## 2014-10-15 DIAGNOSIS — Z9889 Other specified postprocedural states: Secondary | ICD-10-CM

## 2014-10-15 DIAGNOSIS — I340 Nonrheumatic mitral (valve) insufficiency: Secondary | ICD-10-CM

## 2014-10-15 DIAGNOSIS — Z5181 Encounter for therapeutic drug level monitoring: Secondary | ICD-10-CM

## 2014-10-15 LAB — POCT INR: INR: 2.3

## 2014-10-15 MED ORDER — WARFARIN SODIUM 2.5 MG PO TABS: ORAL_TABLET | ORAL | Status: DC

## 2014-10-29 ENCOUNTER — Ambulatory Visit (INDEPENDENT_AMBULATORY_CARE_PROVIDER_SITE_OTHER): Payer: BC Managed Care – PPO | Admitting: Pharmacist Clinician (PhC)/ Clinical Pharmacy Specialist

## 2014-10-29 DIAGNOSIS — I340 Nonrheumatic mitral (valve) insufficiency: Secondary | ICD-10-CM

## 2014-10-29 DIAGNOSIS — Z9889 Other specified postprocedural states: Secondary | ICD-10-CM

## 2014-10-29 DIAGNOSIS — Z5181 Encounter for therapeutic drug level monitoring: Secondary | ICD-10-CM

## 2014-10-29 LAB — POCT INR: INR: 1.5

## 2014-11-12 ENCOUNTER — Ambulatory Visit (INDEPENDENT_AMBULATORY_CARE_PROVIDER_SITE_OTHER): Payer: BC Managed Care – PPO | Admitting: Pharmacist Clinician (PhC)/ Clinical Pharmacy Specialist

## 2014-11-12 DIAGNOSIS — Z9889 Other specified postprocedural states: Secondary | ICD-10-CM

## 2014-11-12 DIAGNOSIS — I340 Nonrheumatic mitral (valve) insufficiency: Secondary | ICD-10-CM

## 2014-11-12 DIAGNOSIS — Z5181 Encounter for therapeutic drug level monitoring: Secondary | ICD-10-CM

## 2014-11-12 LAB — POCT INR: INR: 1.3

## 2014-11-26 ENCOUNTER — Ambulatory Visit (INDEPENDENT_AMBULATORY_CARE_PROVIDER_SITE_OTHER): Payer: BC Managed Care – PPO | Admitting: Pharmacist Clinician (PhC)/ Clinical Pharmacy Specialist

## 2014-11-26 DIAGNOSIS — Z9889 Other specified postprocedural states: Secondary | ICD-10-CM

## 2014-11-26 DIAGNOSIS — I340 Nonrheumatic mitral (valve) insufficiency: Secondary | ICD-10-CM

## 2014-11-26 DIAGNOSIS — Z5181 Encounter for therapeutic drug level monitoring: Secondary | ICD-10-CM

## 2014-11-26 LAB — POCT INR: INR: 2.8

## 2014-12-06 ENCOUNTER — Encounter (HOSPITAL_COMMUNITY): Payer: Self-pay | Admitting: Interventional Cardiology

## 2014-12-17 ENCOUNTER — Ambulatory Visit (INDEPENDENT_AMBULATORY_CARE_PROVIDER_SITE_OTHER): Payer: BC Managed Care – PPO | Admitting: Pharmacist Clinician (PhC)/ Clinical Pharmacy Specialist

## 2014-12-17 DIAGNOSIS — Z5181 Encounter for therapeutic drug level monitoring: Secondary | ICD-10-CM

## 2014-12-17 DIAGNOSIS — Z9889 Other specified postprocedural states: Secondary | ICD-10-CM

## 2014-12-17 DIAGNOSIS — I340 Nonrheumatic mitral (valve) insufficiency: Secondary | ICD-10-CM

## 2014-12-17 LAB — POCT INR: INR: 3

## 2015-01-14 ENCOUNTER — Ambulatory Visit (INDEPENDENT_AMBULATORY_CARE_PROVIDER_SITE_OTHER): Payer: BC Managed Care – PPO | Admitting: Pharmacist Clinician (PhC)/ Clinical Pharmacy Specialist

## 2015-01-14 DIAGNOSIS — Z9889 Other specified postprocedural states: Secondary | ICD-10-CM

## 2015-01-14 DIAGNOSIS — I340 Nonrheumatic mitral (valve) insufficiency: Secondary | ICD-10-CM

## 2015-01-14 DIAGNOSIS — Z5181 Encounter for therapeutic drug level monitoring: Secondary | ICD-10-CM

## 2015-01-14 LAB — POCT INR: INR: 2.9

## 2015-01-29 IMAGING — CR DG CHEST 2V
2 series · 2 of 2 positions shown · non-contrast
Comparison: DG CHEST 2 VIEW dated 04/30/2014; DG CHEST 1V PORT dated
04/29/2014;

CLINICAL DATA: follow up right lung opacity

EXAM:
CHEST  2 VIEW

[w chest pa]
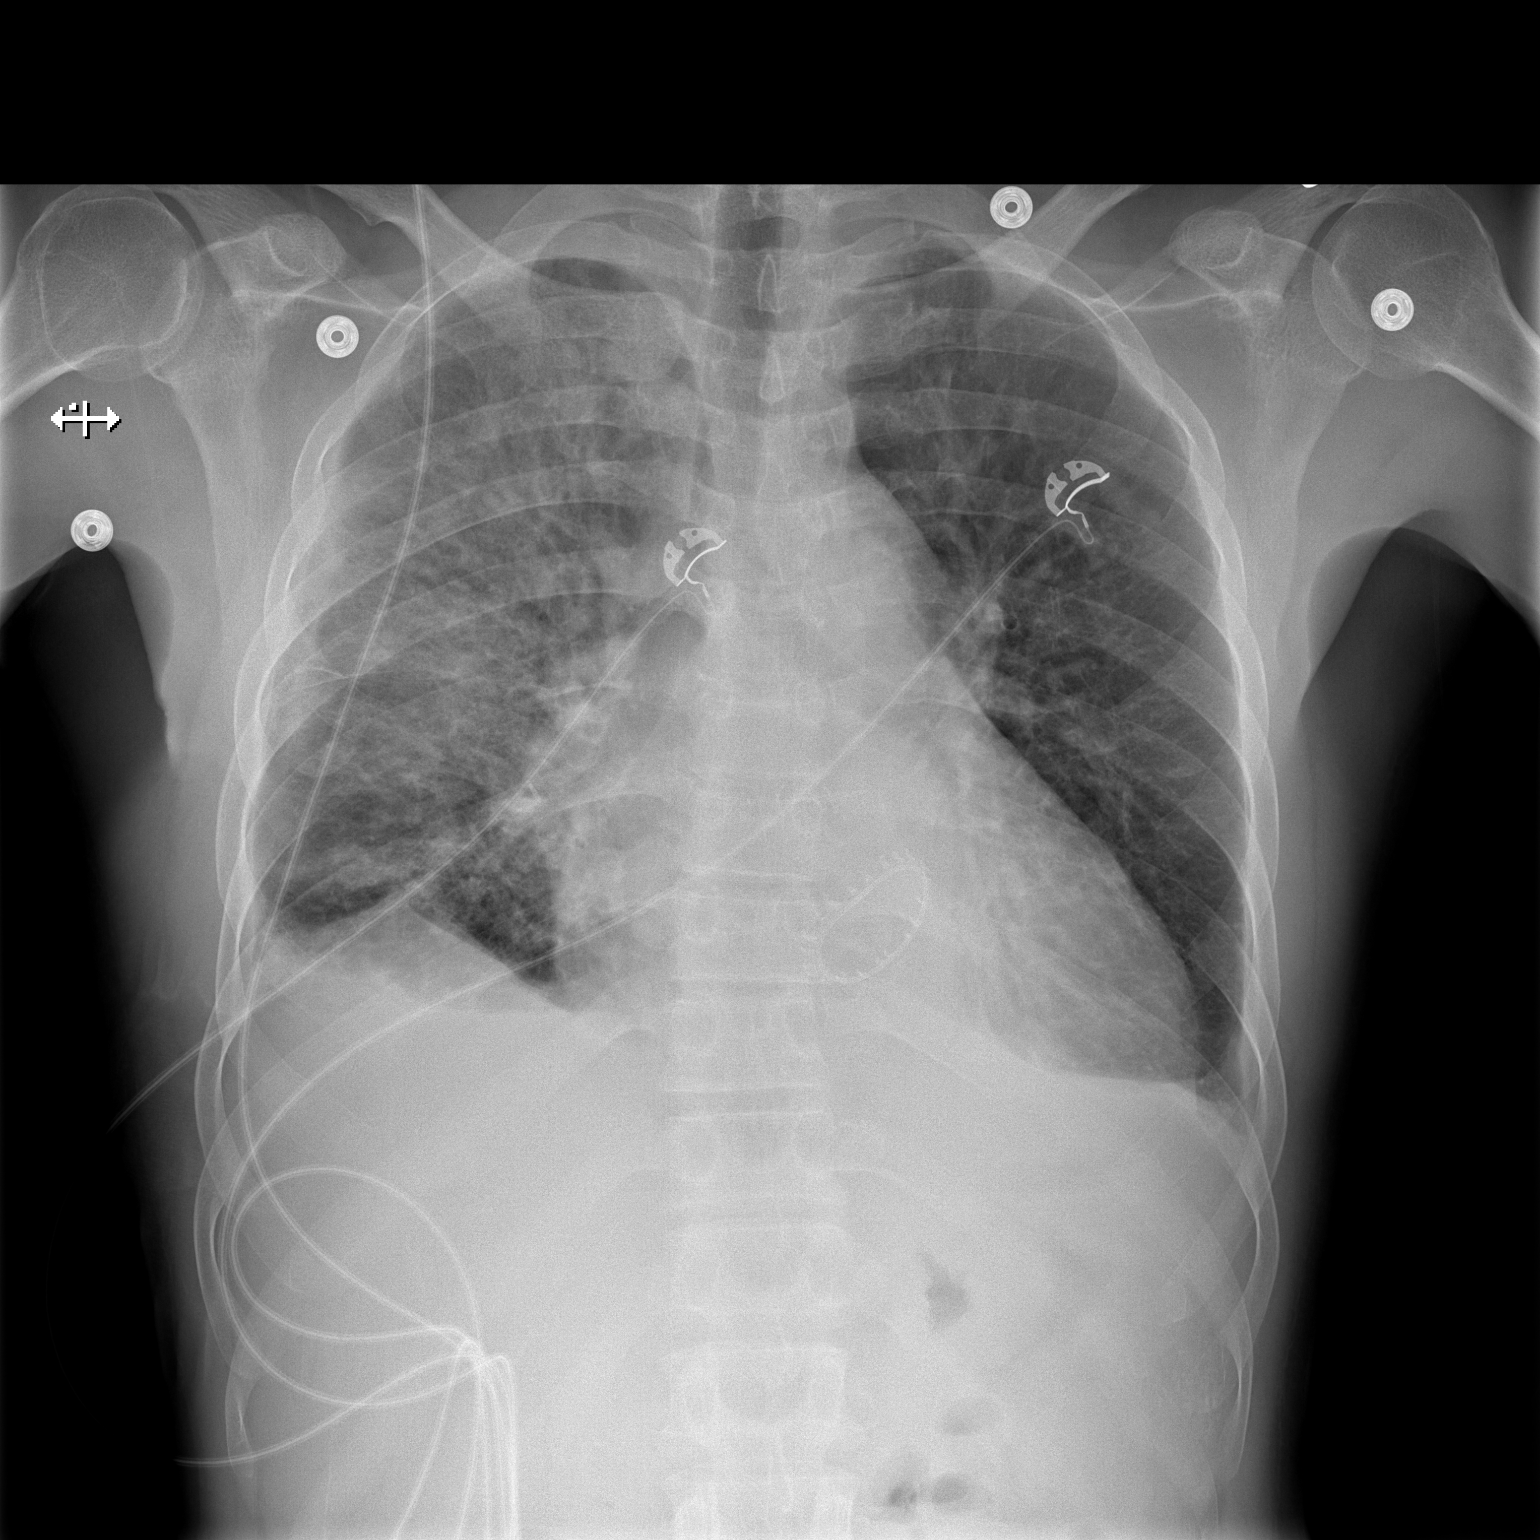

[w chest lat]
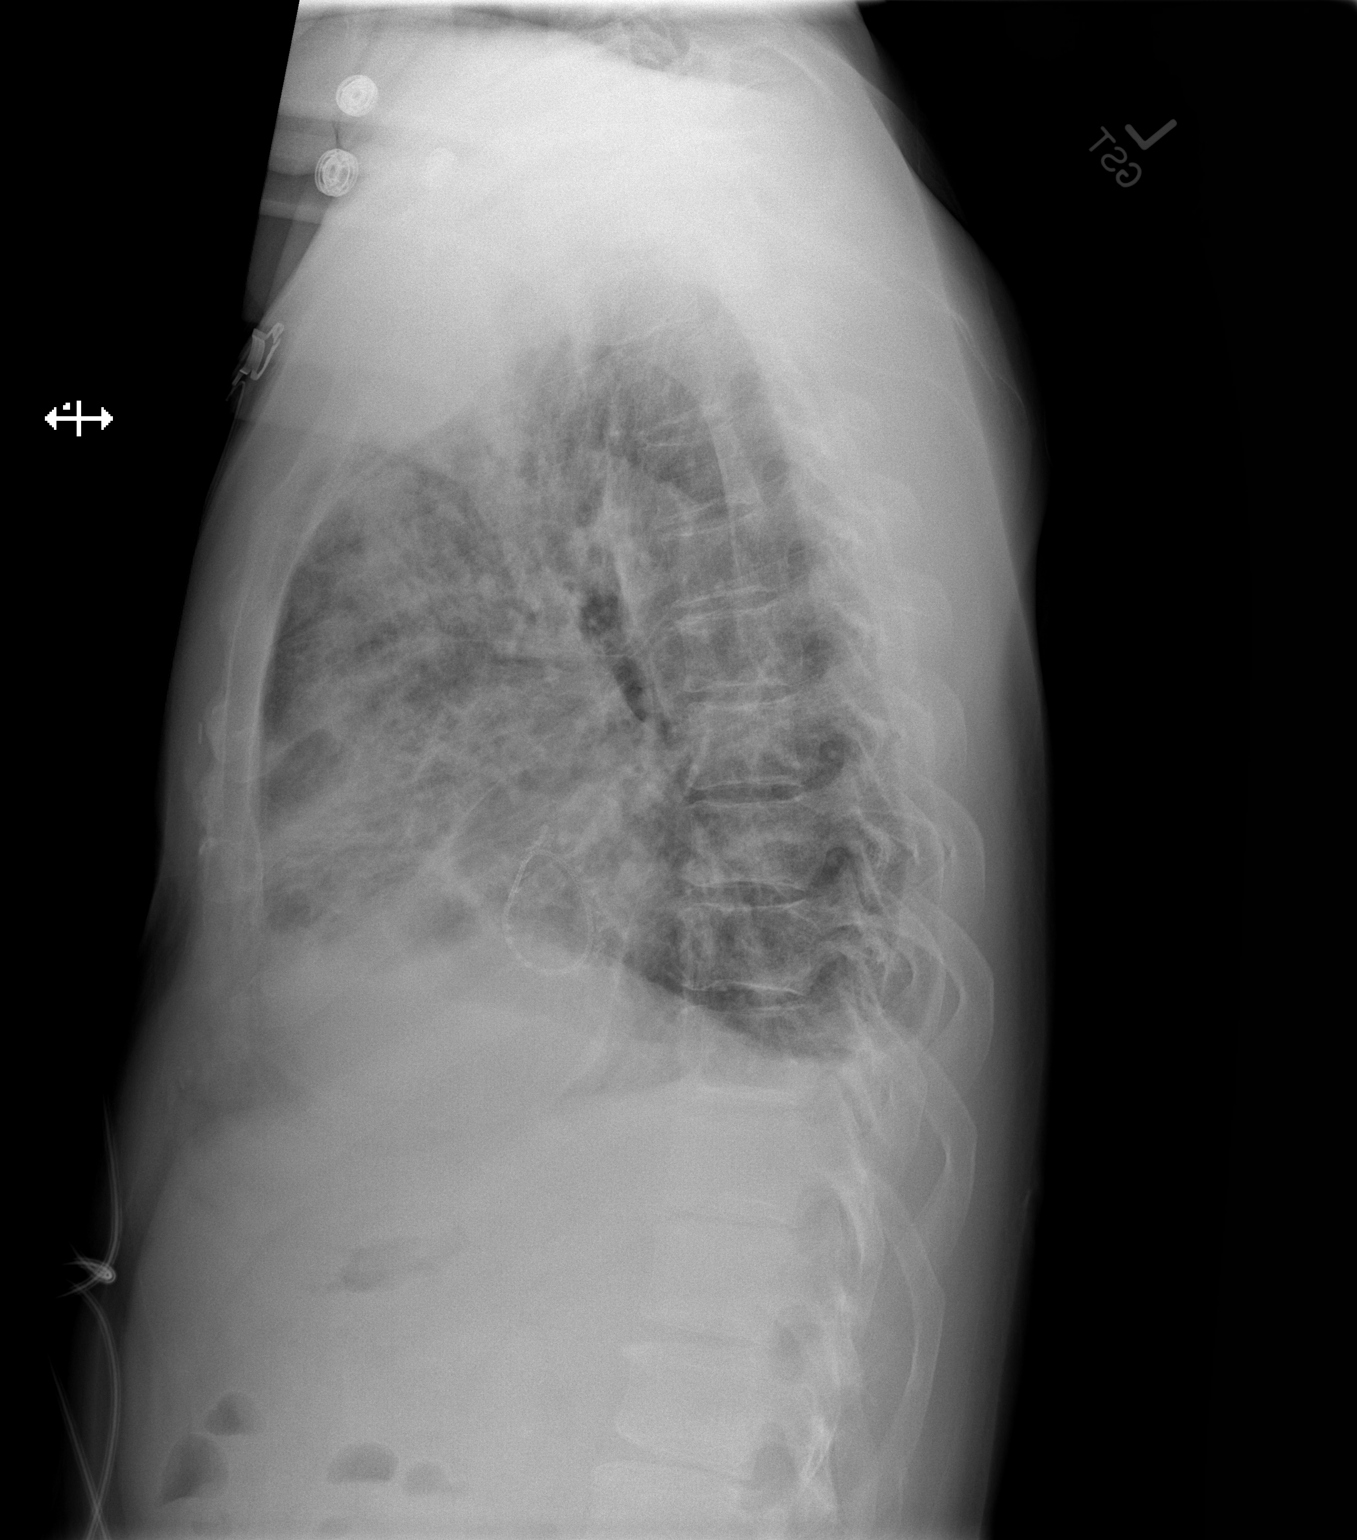

[2 of 2 positions shown; findings below may reference images not displayed]

FINDINGS: Low lung volumes. The cardiac silhouette is enlarged. The mitral
valve prostheses is stable. There is decreased conspicuity of the
right upper lobe density. Diffuse thickening of the interstitial
markings is appreciated, right greater than left. There is blunting
of the costophrenic angles. A mild area of tenting within the right
lung base. No new focal regions of consolidation or new focal
infiltrates. No acute osseus abnormalities.
IMPRESSION: Improvement in the right upper lobe infiltrate.

Small bilateral effusions

Volume loss right lung base

Interstitial findings may reflect a component of mild pulmonary
edema.

## 2015-02-03 IMAGING — CR DG CHEST 2V
2 series · 2 of 2 positions shown · non-contrast
Comparison: May 02, 2014

CLINICAL DATA: Mitral valve prolapse

EXAM:
CHEST  2 VIEW

[view not recorded (1 of 2)]
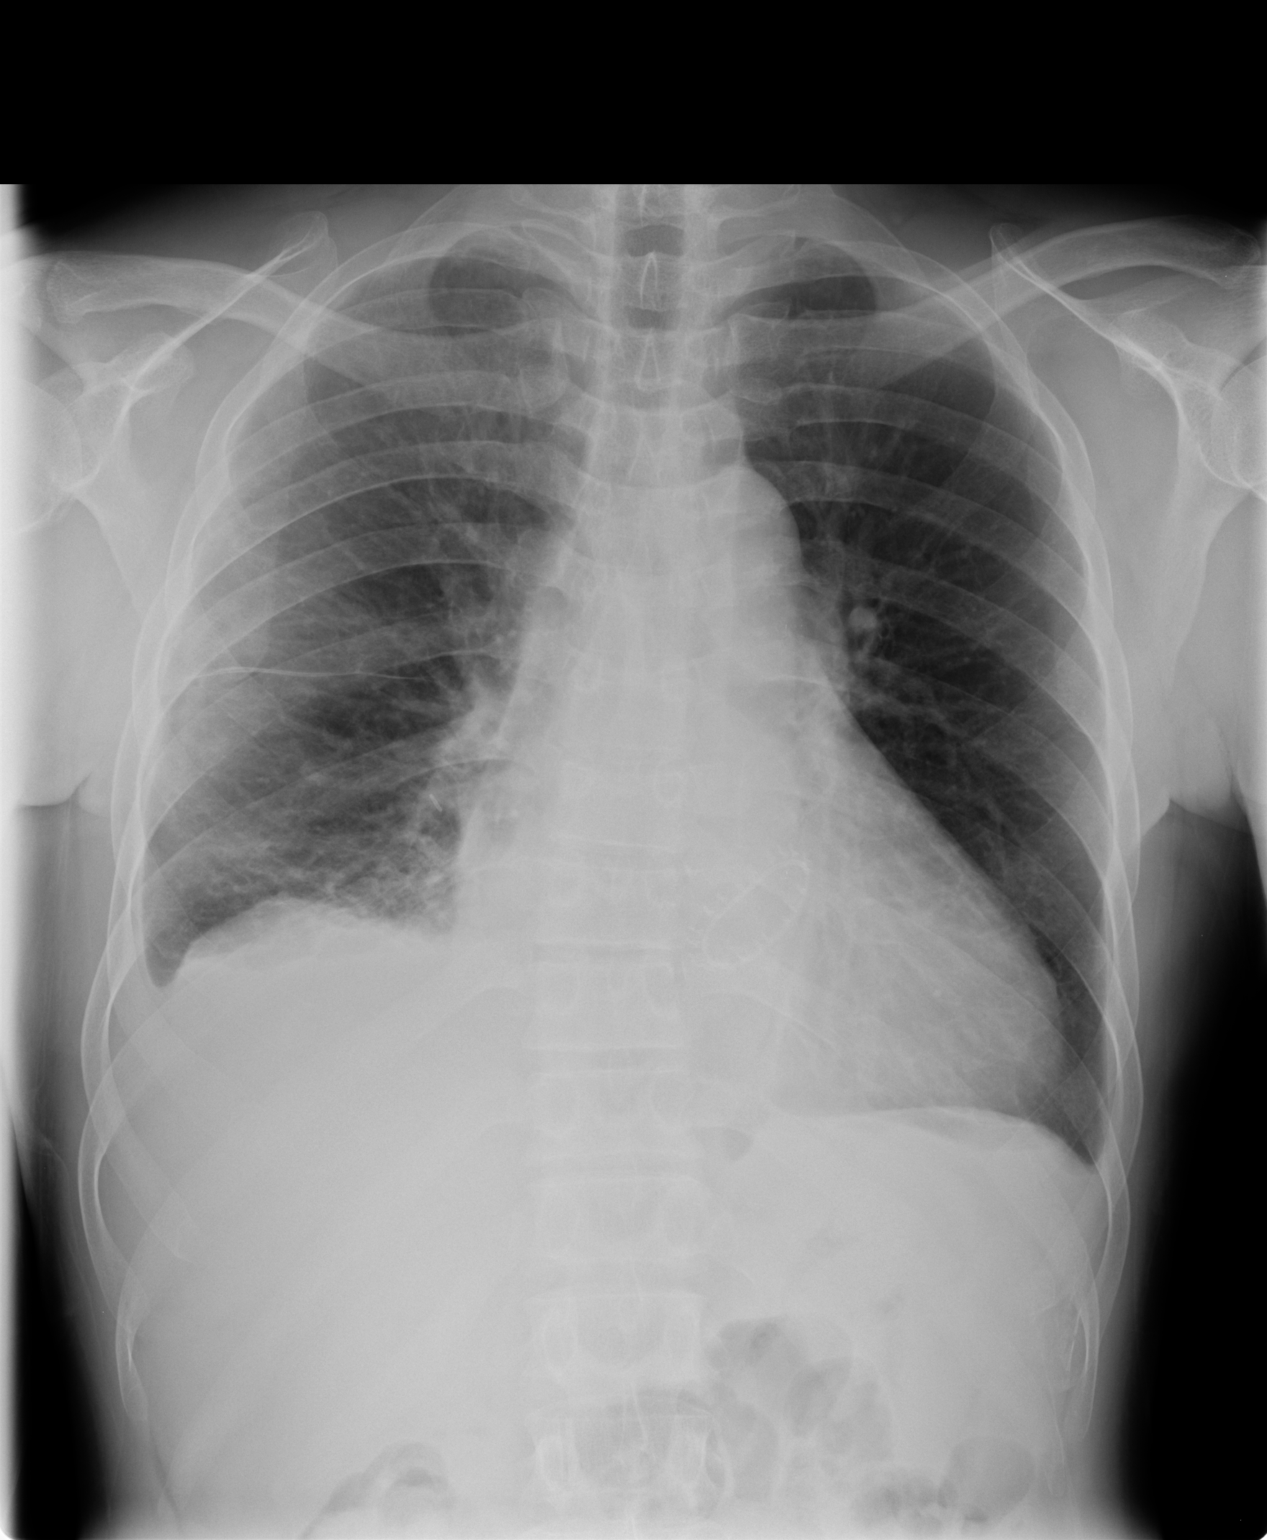

[view not recorded (2 of 2)]
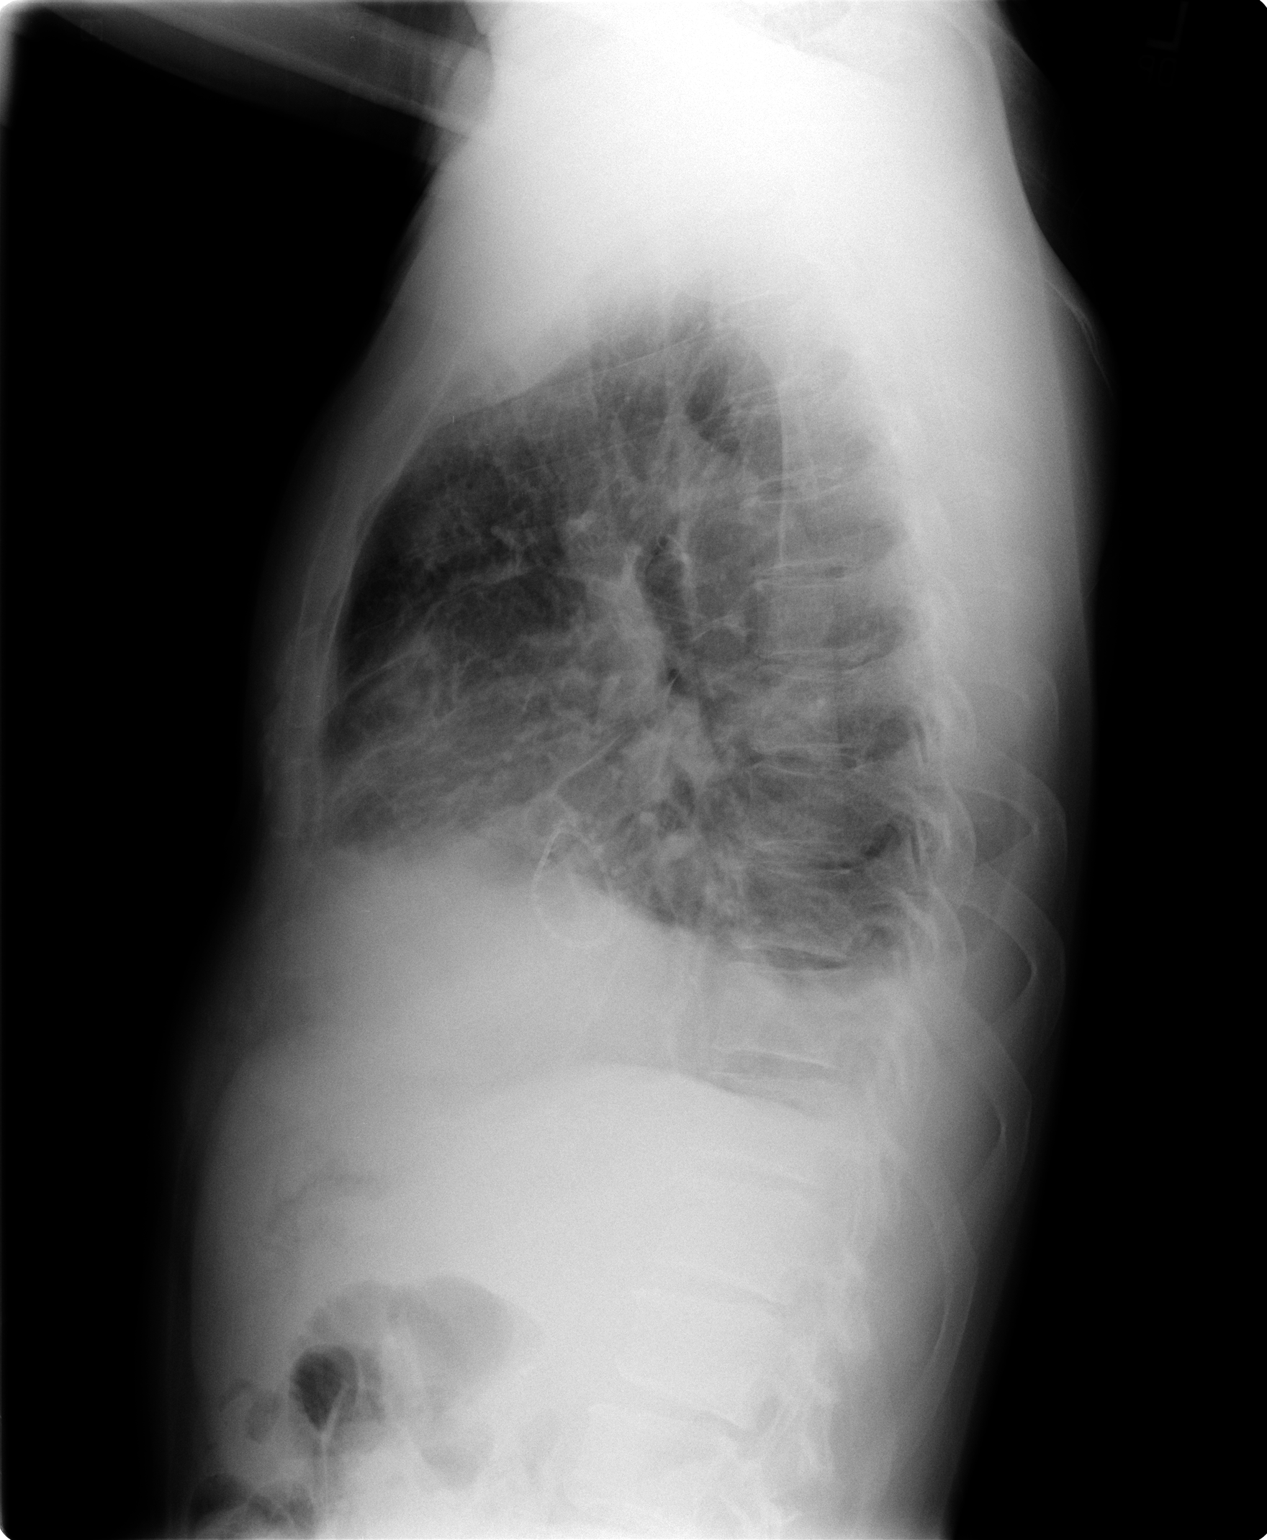

[2 of 2 positions shown; findings below may reference images not displayed]

FINDINGS: There is been significant clearing of interstitial infiltrate from
the right lung compared to recent prior study. Some patchy
interstitial infiltrate in the right base remains. Elsewhere, lungs
are clear. There is underlying emphysematous change.

Heart is enlarged with normal pulmonary vascularity. There is a
prosthetic mitral valve. No adenopathy. No bone lesions.
IMPRESSION: Interval clearing since recent prior study. Patchy interstitial
infiltrate remains in the right lung. Underlying emphysema. No new
opacity. No change in cardiac silhouette.

## 2015-02-11 ENCOUNTER — Ambulatory Visit: Payer: BC Managed Care – PPO | Admitting: Pharmacist Clinician (PhC)/ Clinical Pharmacy Specialist

## 2015-02-18 ENCOUNTER — Ambulatory Visit (INDEPENDENT_AMBULATORY_CARE_PROVIDER_SITE_OTHER): Payer: BC Managed Care – PPO | Admitting: Pharmacist Clinician (PhC)/ Clinical Pharmacy Specialist

## 2015-02-18 DIAGNOSIS — I340 Nonrheumatic mitral (valve) insufficiency: Secondary | ICD-10-CM

## 2015-02-18 DIAGNOSIS — Z5181 Encounter for therapeutic drug level monitoring: Secondary | ICD-10-CM

## 2015-02-18 DIAGNOSIS — Z9889 Other specified postprocedural states: Secondary | ICD-10-CM

## 2015-02-18 LAB — POCT INR: INR: 4.1

## 2015-02-25 ENCOUNTER — Other Ambulatory Visit: Payer: Self-pay | Admitting: Cardiology

## 2015-03-04 ENCOUNTER — Ambulatory Visit (INDEPENDENT_AMBULATORY_CARE_PROVIDER_SITE_OTHER): Payer: BC Managed Care – PPO | Admitting: Pharmacist Clinician (PhC)/ Clinical Pharmacy Specialist

## 2015-03-04 DIAGNOSIS — Z9889 Other specified postprocedural states: Secondary | ICD-10-CM

## 2015-03-04 DIAGNOSIS — I340 Nonrheumatic mitral (valve) insufficiency: Secondary | ICD-10-CM

## 2015-03-04 DIAGNOSIS — Z5181 Encounter for therapeutic drug level monitoring: Secondary | ICD-10-CM

## 2015-03-04 LAB — POCT INR: INR: 3.3

## 2015-03-18 ENCOUNTER — Ambulatory Visit (INDEPENDENT_AMBULATORY_CARE_PROVIDER_SITE_OTHER): Payer: BC Managed Care – PPO | Admitting: Pharmacist Clinician (PhC)/ Clinical Pharmacy Specialist

## 2015-03-18 DIAGNOSIS — Z5181 Encounter for therapeutic drug level monitoring: Secondary | ICD-10-CM

## 2015-03-18 DIAGNOSIS — Z9889 Other specified postprocedural states: Secondary | ICD-10-CM

## 2015-03-18 DIAGNOSIS — I340 Nonrheumatic mitral (valve) insufficiency: Secondary | ICD-10-CM

## 2015-03-18 LAB — POCT INR: INR: 3.6

## 2015-04-08 ENCOUNTER — Ambulatory Visit (INDEPENDENT_AMBULATORY_CARE_PROVIDER_SITE_OTHER): Payer: BLUE CROSS/BLUE SHIELD | Admitting: Pharmacist Clinician (PhC)/ Clinical Pharmacy Specialist

## 2015-04-08 DIAGNOSIS — I340 Nonrheumatic mitral (valve) insufficiency: Secondary | ICD-10-CM | POA: Diagnosis not present

## 2015-04-08 DIAGNOSIS — Z5181 Encounter for therapeutic drug level monitoring: Secondary | ICD-10-CM

## 2015-04-08 DIAGNOSIS — Z9889 Other specified postprocedural states: Secondary | ICD-10-CM

## 2015-04-08 LAB — POCT INR: INR: 3.3

## 2015-04-15 ENCOUNTER — Telehealth: Payer: Self-pay | Admitting: Pharmacist Clinician (PhC)/ Clinical Pharmacy Specialist

## 2015-04-15 ENCOUNTER — Encounter: Payer: Self-pay | Admitting: *Deleted

## 2015-04-15 NOTE — Telephone Encounter (Signed)
Letter done and faxed to Dr. Randa Evens office to stop coumadin for three days prior to procedure and restart day of procedure .

## 2015-04-15 NOTE — Telephone Encounter (Signed)
Requesting clearance from [facility] to hold warfarin dose x 3 days for surveillance colonoscopy.  Pt has CHADS2 score of 1 (hypertension, treated on losartan).  Ok to hold warfarin x 3 days as requested per protocol.  Will fax clearance back to that office.

## 2015-04-22 ENCOUNTER — Ambulatory Visit (INDEPENDENT_AMBULATORY_CARE_PROVIDER_SITE_OTHER): Payer: BC Managed Care – PPO | Admitting: Pharmacist Clinician (PhC)/ Clinical Pharmacy Specialist

## 2015-04-22 DIAGNOSIS — Z9889 Other specified postprocedural states: Secondary | ICD-10-CM

## 2015-04-22 DIAGNOSIS — Z5181 Encounter for therapeutic drug level monitoring: Secondary | ICD-10-CM

## 2015-04-22 DIAGNOSIS — I340 Nonrheumatic mitral (valve) insufficiency: Secondary | ICD-10-CM | POA: Diagnosis not present

## 2015-04-22 LAB — POCT INR: INR: 2

## 2015-04-23 ENCOUNTER — Telehealth: Payer: Self-pay | Admitting: *Deleted

## 2015-04-23 NOTE — Telephone Encounter (Signed)
Surgical request to hold Warfarin 3 days prior to colonoscopy.   Per Dr.Hochrein "Ok to hold warfarin 3-5 days prior to procedure"  Response letter was faxed back to Dr. Randa Evens at [facility].

## 2015-04-29 ENCOUNTER — Ambulatory Visit: Payer: BLUE CROSS/BLUE SHIELD | Admitting: Thoracic Surgery (Cardiothoracic Vascular Surgery)

## 2015-05-02 ENCOUNTER — Ambulatory Visit: Payer: BLUE CROSS/BLUE SHIELD | Admitting: Thoracic Surgery (Cardiothoracic Vascular Surgery)

## 2015-05-13 ENCOUNTER — Ambulatory Visit: Payer: BC Managed Care – PPO | Admitting: Thoracic Surgery (Cardiothoracic Vascular Surgery)

## 2015-05-13 ENCOUNTER — Ambulatory Visit (INDEPENDENT_AMBULATORY_CARE_PROVIDER_SITE_OTHER): Payer: BLUE CROSS/BLUE SHIELD | Admitting: Pharmacist Clinician (PhC)/ Clinical Pharmacy Specialist

## 2015-05-13 DIAGNOSIS — I340 Nonrheumatic mitral (valve) insufficiency: Secondary | ICD-10-CM | POA: Diagnosis not present

## 2015-05-13 DIAGNOSIS — Z9889 Other specified postprocedural states: Secondary | ICD-10-CM

## 2015-05-13 DIAGNOSIS — Z5181 Encounter for therapeutic drug level monitoring: Secondary | ICD-10-CM | POA: Diagnosis not present

## 2015-05-13 LAB — POCT INR: INR: 1.7

## 2015-05-22 ENCOUNTER — Other Ambulatory Visit: Payer: Self-pay | Admitting: Gastroenterology

## 2015-05-31 ENCOUNTER — Ambulatory Visit: Payer: BLUE CROSS/BLUE SHIELD | Admitting: Pharmacist Clinician (PhC)/ Clinical Pharmacy Specialist

## 2015-06-03 ENCOUNTER — Encounter: Payer: Self-pay | Admitting: Thoracic Surgery (Cardiothoracic Vascular Surgery)

## 2015-06-03 ENCOUNTER — Ambulatory Visit (INDEPENDENT_AMBULATORY_CARE_PROVIDER_SITE_OTHER): Payer: BLUE CROSS/BLUE SHIELD | Admitting: Thoracic Surgery (Cardiothoracic Vascular Surgery)

## 2015-06-03 ENCOUNTER — Ambulatory Visit (INDEPENDENT_AMBULATORY_CARE_PROVIDER_SITE_OTHER): Payer: BLUE CROSS/BLUE SHIELD | Admitting: *Deleted

## 2015-06-03 VITALS — BP 116/71 | HR 92 | Resp 20 | Ht 67.0 in | Wt 160.0 lb

## 2015-06-03 DIAGNOSIS — Z9889 Other specified postprocedural states: Secondary | ICD-10-CM

## 2015-06-03 DIAGNOSIS — Z5181 Encounter for therapeutic drug level monitoring: Secondary | ICD-10-CM

## 2015-06-03 DIAGNOSIS — I340 Nonrheumatic mitral (valve) insufficiency: Secondary | ICD-10-CM | POA: Diagnosis not present

## 2015-06-03 LAB — POCT INR: INR: 2.4

## 2015-06-03 NOTE — Patient Instructions (Signed)
Schedule follow up appointment with Dr Antoine Poche to recheck heart rhythm and consider stopping Coumadin (warfarin)  Continue taking all current medications without change at this time   Endocarditis is a potentially serious infection of heart valves or inside lining of the heart.  It occurs more commonly in patients with diseased heart valves (such as patient's with aortic or mitral valve disease) and in patients who have undergone heart valve repair or replacement.  Certain surgical and dental procedures may put you at risk, such as dental cleaning, other dental procedures, or any surgery involving the respiratory, urinary, gastrointestinal tract, gallbladder or prostate gland.   To minimize your chances for develooping endocarditis, maintain good oral health and seek prompt medical attention for any infections involving the mouth, teeth, gums, skin or urinary tract.  Always notify your doctor or dentist about your underlying heart valve condition before having any invasive procedures. You will need to take antibiotics before certain procedures.

## 2015-06-03 NOTE — Progress Notes (Signed)
[address]       [place] [address]             [phone]     CARDIOTHORACIC SURGERY OFFICE NOTE  Referring Provider is Rollene Rotunda, MD PCP is Shirlee Latch, MD   HPI:  Patient returns for routine followup more than 1 year status post minimally invasive mitral valve repair on 04/26/2014. His postoperative recovery was uncomplicated but notable for recurrent atrial fibrillation and atrial flutter for which he has been anticoagulated with coumadin.  He was seen most recently in our office on 07/02/2014 and he was seen most recently by Dr. Antoine Poche on 09/10/2014, at which time he was maintaining sinus rhythm.  Since then the patient has done very well clinically. He states that he feels very well and noticeably improved in comparison with how he felt prior to surgery. He states that his exercise tolerance is better than it was prior to surgery. He has no physical limitations whatsoever and he specifically does not get any shortness of breath with activity. He is very pleased with how things have turned out and he expresses his gratitude for his care.   Current Outpatient Prescriptions  Medication Sig Dispense Refill  . furosemide (LASIX) 20 MG tablet Take 1 tablet (20 mg total) by mouth daily with breakfast. 30 tablet 11  . losartan (COZAAR) 25 MG tablet Take 1 tablet (25 mg total) by mouth daily. 30 tablet 11  . potassium chloride SA (K-DUR,KLOR-CON) 20 MEQ tablet Take 1 tablet (20 mEq total) by mouth daily with breakfast. 30 tablet 11  . warfarin (COUMADIN) 2.5 MG tablet TAKE 1-2 TABLETS BY MOUTH DAILY AS DIRECTED BY COUMADIN CLINIC 135 tablet 1   No current facility-administered medications for this visit.      Physical Exam:   BP 116/71 mmHg  Pulse 92  Resp 20  Ht  (1.702 m)  Wt 160 lb (72.576 kg)  BMI 25.05 kg/m2  SpO2 98%  General:  Well-appearing  Chest:   clear  CV:   Regular rate and rhythm without murmur  Incisions:  Completely  healed  Abdomen:  Soft and nontender  Extremities:  Warm and well-perfused  Diagnostic Tests:  Transthoracic Echocardiography  Patient:  Bradley Hansen, Bradley Hansen MR #:    [id number] Study Date: 05/23/2014 Gender:   M Age:    64 Height:   170.2 cm Weight:   81.2 kg BSA:    1.98 m^2 Pt. Status: Room:  REFERRING  Rollene Rotunda SONOGRAPHER [place], Will Doreene Adas, Scott T REFERRING  Alben Spittle, Scott T ATTENDING  Thurmon Fair, MD PERFORMING  [facility], Outpatient  cc:  ------------------------------------------------------------------- LV EF: 45% -  50%  ------------------------------------------------------------------- Indications:   424.0 Mitral valve disease.  ------------------------------------------------------------------- History:  PMH: S/p MVR. Acquired from the patient and from the patient&'s chart. Atrial flutter. Mitral valve prolapse.  ------------------------------------------------------------------- Study Conclusions  - Left ventricle: The cavity size was normal. Wall thickness was normal. Systolic function was mildly reduced. The estimated ejection fraction was in the range of 45% to 50%. Diffuse hypokinesis. The study is not technically sufficient to allow evaluation of LV diastolic function. - Ventricular septum: Septal motion showed paradox. - Aortic valve: There was mild regurgitation. - Mitral valve: Prior procedures included surgical repair. An annular ring prosthesis was present. - Left atrium: The atrium was moderately dilated. - Right atrium: The atrium was mildly dilated. - Atrial septum: No defect or patent foramen ovale was  identified. - Tricuspid valve: Mild, late systolicprolapse.  -------------------------------------------------------------------  ------------------------------------------------------------------- Left ventricle: The cavity size was normal. Wall thickness was normal.  Systolic function was mildly reduced. The estimated ejection fraction was in the range of 45% to 50%. Diffuse hypokinesis. The study is not technically sufficient to allow evaluation of LV diastolic function.  ------------------------------------------------------------------- Aortic valve:  Structurally normal valve. Trileaflet. Cusp separation was normal. Doppler: Transvalvular velocity was within the normal range. There was no stenosis. There was mild regurgitation.  ------------------------------------------------------------------- Aorta: Aortic root: The aortic root was normal in size. Ascending aorta: The ascending aorta was normal in size.  ------------------------------------------------------------------- Mitral valve:  Moderately thickened leaflets . Moderate myxomatous degeneration. Prior procedures included surgical repair. An annular ring prosthesis was present. Gradients (measured at heart rate of 70 bpm) are mildly elevated due to annuloplasty. Doppler: There was no significant regurgitation.  Valve area by pressure half-time: 3.61 cm^2. Indexed valve area by pressure half-time: 1.82 cm^2/m^2. Valve area by continuity equation (using LVOT flow): 2.02 cm^2. Indexed valve area by continuity equation (using LVOT flow): 1.02 cm^2/m^2.  Mean gradient (D): 5 mm Hg. Peak gradient (D): 9 mm Hg.  ------------------------------------------------------------------- Left atrium: The atrium was moderately dilated.  ------------------------------------------------------------------- Atrial septum: No defect or patent foramen ovale was identified.  ------------------------------------------------------------------- Right ventricle: The cavity size was normal. Wall thickness was normal. Systolic function was normal.  ------------------------------------------------------------------- Ventricular septum:  Septal motion showed  paradox.  ------------------------------------------------------------------- Pulmonic valve:  Structurally normal valve.  Cusp separation was normal. Doppler: Transvalvular velocity was within the normal range. There was trivial regurgitation.  ------------------------------------------------------------------- Tricuspid valve:  Mild, late systolicprolapse. Doppler: There was trivial regurgitation.  ------------------------------------------------------------------- Right atrium: The atrium was mildly dilated.  ------------------------------------------------------------------- Pericardium: There was no pericardial effusion.  ------------------------------------------------------------------- Systemic veins: Inferior vena cava: The vessel was normal in size. The respirophasic diameter changes were in the normal range (>= 50%), consistent with normal central venous pressure.  ------------------------------------------------------------------- Prepared and Electronically Authenticated by  Thurmon Fair, MD 2015-05-27T16:00:16    Impression:  Patient is doing very well more than one year status post minimally invasive mitral valve repair. He reports normal exercise tolerance with no exertional shortness of breath whatsoever. He reports no physical limitations. He remains anticoagulated using warfarin. He was maintaining sinus rhythm at the time of his most recent cardiology office visit last September.   Plan:  I have reminded the patient to make certain that he has a follow-up appointment scheduled with Dr. Antoine Poche.  I think it might be reasonable to consider stopping Coumadin if he continues to maintain sinus rhythm. The patient has been reminded regarding his lifelong need for antibiotic prophylaxis for all dental cleaning and related procedures. All of his questions have been addressed. In the future he will call and return to see [place] as needed.  I spent in excess  of 15 minutes during the conduct of this office consultation and >50% of this time involved direct face-to-face encounter with the patient for counseling and/or coordination of their care.   Salvatore Decent. Cornelius Moras, MD 06/03/2015 10:45 AM

## 2015-06-04 ENCOUNTER — Telehealth: Payer: Self-pay | Admitting: Cardiology

## 2015-06-10 NOTE — Telephone Encounter (Signed)
Close encounter 

## 2015-06-19 ENCOUNTER — Other Ambulatory Visit: Payer: Self-pay | Admitting: Physician Assistant

## 2015-06-19 ENCOUNTER — Other Ambulatory Visit: Payer: Self-pay

## 2015-06-19 MED ORDER — FUROSEMIDE 20 MG PO TABS: 20.0000 mg | ORAL_TABLET | Freq: Every day | ORAL | Status: DC

## 2015-06-27 ENCOUNTER — Ambulatory Visit (INDEPENDENT_AMBULATORY_CARE_PROVIDER_SITE_OTHER): Payer: BLUE CROSS/BLUE SHIELD | Admitting: Pharmacist Clinician (PhC)/ Clinical Pharmacy Specialist

## 2015-06-27 DIAGNOSIS — I340 Nonrheumatic mitral (valve) insufficiency: Secondary | ICD-10-CM | POA: Diagnosis not present

## 2015-06-27 DIAGNOSIS — Z5181 Encounter for therapeutic drug level monitoring: Secondary | ICD-10-CM | POA: Diagnosis not present

## 2015-06-27 DIAGNOSIS — Z9889 Other specified postprocedural states: Secondary | ICD-10-CM

## 2015-06-27 LAB — POCT INR: INR: 1.5

## 2015-07-04 ENCOUNTER — Ambulatory Visit: Payer: BLUE CROSS/BLUE SHIELD | Admitting: Cardiology

## 2015-07-08 ENCOUNTER — Ambulatory Visit (INDEPENDENT_AMBULATORY_CARE_PROVIDER_SITE_OTHER): Payer: BLUE CROSS/BLUE SHIELD | Admitting: Pharmacist Clinician (PhC)/ Clinical Pharmacy Specialist

## 2015-07-08 DIAGNOSIS — Z5181 Encounter for therapeutic drug level monitoring: Secondary | ICD-10-CM | POA: Diagnosis not present

## 2015-07-08 DIAGNOSIS — I340 Nonrheumatic mitral (valve) insufficiency: Secondary | ICD-10-CM

## 2015-07-08 DIAGNOSIS — Z9889 Other specified postprocedural states: Secondary | ICD-10-CM | POA: Diagnosis not present

## 2015-07-08 LAB — POCT INR: INR: 1.6

## 2015-07-15 ENCOUNTER — Ambulatory Visit: Payer: BLUE CROSS/BLUE SHIELD | Admitting: Cardiology

## 2015-07-29 ENCOUNTER — Ambulatory Visit (INDEPENDENT_AMBULATORY_CARE_PROVIDER_SITE_OTHER): Payer: BLUE CROSS/BLUE SHIELD | Admitting: Pharmacist Clinician (PhC)/ Clinical Pharmacy Specialist

## 2015-07-29 DIAGNOSIS — I340 Nonrheumatic mitral (valve) insufficiency: Secondary | ICD-10-CM

## 2015-07-29 DIAGNOSIS — Z9889 Other specified postprocedural states: Secondary | ICD-10-CM | POA: Diagnosis not present

## 2015-07-29 DIAGNOSIS — Z5181 Encounter for therapeutic drug level monitoring: Secondary | ICD-10-CM | POA: Diagnosis not present

## 2015-07-29 LAB — POCT INR: INR: 2.8

## 2015-08-26 ENCOUNTER — Ambulatory Visit (INDEPENDENT_AMBULATORY_CARE_PROVIDER_SITE_OTHER): Payer: BLUE CROSS/BLUE SHIELD | Admitting: Pharmacist Clinician (PhC)/ Clinical Pharmacy Specialist

## 2015-08-26 DIAGNOSIS — Z5181 Encounter for therapeutic drug level monitoring: Secondary | ICD-10-CM

## 2015-08-26 DIAGNOSIS — Z9889 Other specified postprocedural states: Secondary | ICD-10-CM | POA: Diagnosis not present

## 2015-08-26 DIAGNOSIS — I340 Nonrheumatic mitral (valve) insufficiency: Secondary | ICD-10-CM | POA: Diagnosis not present

## 2015-08-26 LAB — POCT INR: INR: 2

## 2015-09-09 ENCOUNTER — Encounter: Payer: Self-pay | Admitting: Cardiology

## 2015-09-09 ENCOUNTER — Ambulatory Visit (INDEPENDENT_AMBULATORY_CARE_PROVIDER_SITE_OTHER): Payer: BLUE CROSS/BLUE SHIELD | Admitting: Cardiology

## 2015-09-09 ENCOUNTER — Ambulatory Visit: Payer: Self-pay | Admitting: Pharmacist Clinician (PhC)/ Clinical Pharmacy Specialist

## 2015-09-09 VITALS — BP 108/78 | HR 86 | Ht 67.0 in | Wt 157.5 lb

## 2015-09-09 DIAGNOSIS — Z5181 Encounter for therapeutic drug level monitoring: Secondary | ICD-10-CM

## 2015-09-09 DIAGNOSIS — Z9889 Other specified postprocedural states: Secondary | ICD-10-CM

## 2015-09-09 DIAGNOSIS — I059 Rheumatic mitral valve disease, unspecified: Secondary | ICD-10-CM | POA: Diagnosis not present

## 2015-09-09 DIAGNOSIS — I340 Nonrheumatic mitral (valve) insufficiency: Secondary | ICD-10-CM

## 2015-09-09 MED ORDER — ASPIRIN EC 81 MG PO TBEC: 81.0000 mg | DELAYED_RELEASE_TABLET | Freq: Every day | ORAL | Status: DC

## 2015-09-09 NOTE — Progress Notes (Signed)
HPI The patient presents for followup of minimally invasive mitral valve repair by Dr. Cornelius Moras. He had postoperative atrial fibrillation and DCCV.  He was on amiodarone. This was discontinued secondary to bradycardia. However, at followup with Dr. Cornelius Moras was noted to be back in atrial flutter. He was again placed on amiodarone.  He converted on this back into sinus.  However follow up EKG demonstrated a markedly abnormal EKG with QT prolongation and T wave inversion.  I stopped the amiodarone.  Follow up EKG demonstrated resolution of these changes.   He has a mildly reduced EF but stable repair on echo in 04/2014.  Since I last saw him he has done well.  The patient denies any new symptoms such as chest discomfort, neck or arm discomfort. There has been no new shortness of breath, PND or orthopnea. There have been no reported palpitations, presyncope or syncope.  No Known Allergies  Current Outpatient Prescriptions  Medication Sig Dispense Refill  . furosemide (LASIX) 20 MG tablet Take 1 tablet (20 mg total) by mouth daily with breakfast. 30 tablet 3  . losartan (COZAAR) 25 MG tablet Take 1 tablet (25 mg total) by mouth daily. 30 tablet 11  . potassium chloride SA (K-DUR,KLOR-CON) 20 MEQ tablet Take 1 tablet (20 mEq total) by mouth daily with breakfast. 30 tablet 11  . warfarin (COUMADIN) 2.5 MG tablet TAKE 1-2 TABLETS BY MOUTH DAILY AS DIRECTED BY COUMADIN CLINIC 135 tablet 1   No current facility-administered medications for this visit.    Past Medical History  Diagnosis Date  . Mitral valve prolapse     TEE  08/2010 severe MR, bileaflet MVP, EF 60%  . Hypercholesteremia   . Synovitis of hand 2011    right hand  . Leukopenia   . Rhinitis 2011    chronic   . Hepatitis C 2004    pt doesn't want treatment  . Fibromyalgia   . Prostatitis   . Prostadynia   . Mitral regurgitation   . MITRAL VALVE PROLAPSE 02/24/2007    Qualifier: Diagnosis of  By: Abundio Miu    . GASTROESOPHAGEAL  REFLUX, NO ESOPHAGITIS 02/24/2007    Qualifier: Diagnosis of  By: Abundio Miu    . HEPATITIS C 02/24/2007    Qualifier: Diagnosis of  By: Abundio Miu    . BACK PAIN, CHRONIC 12/11/2009    Qualifier: Diagnosis of  By: Mauricio Po MD, Fayrene Fearing    . S/P minimally invasive mitral valve repair 04/26/2014    Complex valvuloplasty including artificial Gore-tex neocord placement x8, bovine pericardial patch augmentation of P3 portion of posterior leaflet and 36 mm Sorin Memo 3D ring annuloplasty via right mini thoracotomy  . Hx of echocardiogram     Echo (04/2014):  EF 45-50%, diff HK, mild AI, MV repair ok (mean 5 mmHg), mod LAE, mild RAE, mild late TV systolic prolapse  . Atrial flutter, post operative 06/04/2014    Past Surgical History  Procedure Laterality Date  . Appendectomy    . Tee without cardioversion N/A 03/20/2013    Procedure: TRANSESOPHAGEAL ECHOCARDIOGRAM (TEE);  Surgeon: [name], MD;  Location: [facility] ENDOSCOPY;  Service: Cardiovascular;  Laterality: N/A;  . Mitral valve repair Right 04/26/2014    Procedure: MINIMALLY INVASIVE MITRAL VALVE REPAIR (MVR);  Surgeon: Purcell Nails, MD;  Location: [facility] OR;  Service: Open Heart Surgery;  Laterality: Right;  . Intraoperative transesophageal echocardiogram N/A 04/26/2014    Procedure: INTRAOPERATIVE TRANSESOPHAGEAL ECHOCARDIOGRAM;  Surgeon: Purcell Nails, MD;  Location: [facility] OR;  Service: Open Heart Surgery;  Laterality: N/A;  . Cardioversion N/A 06/01/2014    Procedure: CARDIOVERSION;  Surgeon: [name], MD;  Location: [facility] ENDOSCOPY;  Service: Cardiovascular;  Laterality: N/A;  . Left and right heart catheterization with coronary angiogram N/A 02/19/2014    Procedure: LEFT AND RIGHT HEART CATHETERIZATION WITH CORONARY ANGIOGRAM;  Surgeon: Corky Crafts, MD;  Location: [facility] CATH LAB;  Service: Cardiovascular;  Laterality: N/A;    ROS:   As stated in the HPI and negative for all other systems.  PHYSICAL EXAM BP 108/78 mmHg   Pulse 86  Ht 5\' 7"  (1.702 m)  Wt 157 lb 8 oz (71.442 kg)  BMI 24.66 kg/m2 GENERAL:  Well appearing NECK:  No jugular venous distention, waveform within normal limits, carotid upstroke brisk and symmetric, no bruits, no thyromegaly LUNGS:  Clear to auscultation bilaterally BACK:  No CVA tenderness CHEST:  Unremarkable HEART:  PMI not displaced or sustained,S1 and S2 within normal limits, no S3, no S4, positive click, no rubs, harsh holosystolic murmur heard at the apex  ABD:  Flat, positive bowel sounds normal in frequency in pitch, no bruits, no rebound, no guarding, no midline pulsatile mass, no hepatomegaly, no splenomegaly EXT:  2 plus pulses throughout, no edema, no cyanosis no clubbing  EKG:  Sinus rhythm, rate 86,  No acute ST T wave changes.   09/09/2015  ASSESSMENT AND PLAN  ATRIAL FLUTTER:    He seems to be maintaining sinus rhythm. He can stop his warfarin.  He will restartat 81 mg ASA  MITRAL VALVE REPAIR:  I will follow up with an echo in April of next year before has has follow up with Dr. Cornelius Moras.    MILDLY REDUCED EF:   For now he will continue on the meds as listed.  This will be assessed with the echo as above

## 2015-09-09 NOTE — Patient Instructions (Signed)
Medication Instructions:  Your physician has recommended you make the following change in your medication:  1) STOP Coumadin 2) START Aspirin 81 mg tablet by mouth ONCE daily   Labwork: NONE  Testing/Procedures: Your physician has requested that you have an echocardiogram. Echocardiography is a painless test that uses sound waves to create images of your heart. It provides your doctor with information about the size and shape of your heart and how well your heart's chambers and valves are working. This procedure takes approximately one hour. There are no restrictions for this procedure. SCHEDULE IN April 2017   Follow-Up: Your physician wants you to follow-up in: 12 months with Dr. Antoine Poche. You will receive a reminder letter in the mail two months in advance. If you don't receive a letter, please call our office to schedule the follow-up appointment.   Any Other Special Instructions Will Be Listed Below (If Applicable).

## 2015-09-23 ENCOUNTER — Ambulatory Visit: Payer: BLUE CROSS/BLUE SHIELD | Admitting: Pharmacist Clinician (PhC)/ Clinical Pharmacy Specialist

## 2015-10-05 ENCOUNTER — Other Ambulatory Visit: Payer: Self-pay | Admitting: Cardiology

## 2015-10-23 ENCOUNTER — Ambulatory Visit (INDEPENDENT_AMBULATORY_CARE_PROVIDER_SITE_OTHER): Payer: BLUE CROSS/BLUE SHIELD

## 2015-10-23 ENCOUNTER — Ambulatory Visit (INDEPENDENT_AMBULATORY_CARE_PROVIDER_SITE_OTHER): Payer: BLUE CROSS/BLUE SHIELD | Admitting: Emergency Medicine

## 2015-10-23 VITALS — BP 130/80 | HR 81 | Temp 99.2°F | Resp 20 | Ht 68.0 in | Wt 163.4 lb

## 2015-10-23 DIAGNOSIS — M542 Cervicalgia: Secondary | ICD-10-CM

## 2015-10-23 DIAGNOSIS — M5430 Sciatica, unspecified side: Secondary | ICD-10-CM | POA: Diagnosis not present

## 2015-10-23 MED ORDER — CYCLOBENZAPRINE HCL 10 MG PO TABS
10.0000 mg | ORAL_TABLET | Freq: Three times a day (TID) | ORAL | Status: DC | PRN
Start: 2015-10-23 — End: 2016-12-14

## 2015-10-23 MED ORDER — NAPROXEN SODIUM 550 MG PO TABS: 550.0000 mg | ORAL_TABLET | Freq: Two times a day (BID) | ORAL | Status: AC

## 2015-10-23 NOTE — Progress Notes (Signed)
Subjective:  Patient ID: Bradley Hansen, male    DOB: 1951-09-10  Age: 65 y.o. MRN: [id number]  CC: Neck Pain   HPI Bradley Hansen presents  patient has pain in both the back of his neck and upper shoulders and his low back radiating around her hips. This is lasted for the last  1 week. He denies any history of injury or overuse. He has no radicular symptoms and numbness tingling or weakness in lower upper extremities. Says the pain is worse when he gets out of bed in the morning. He hasn't taken any medicine for the discomfort.  History Bradley Hansen has a past medical history of Mitral valve prolapse; Hypercholesteremia; Synovitis of hand (2011); Leukopenia; Rhinitis (2011); Hepatitis C (2004); Fibromyalgia; Prostatitis; Prostadynia; Mitral regurgitation; MITRAL VALVE PROLAPSE (02/24/2007); GASTROESOPHAGEAL REFLUX, NO ESOPHAGITIS (02/24/2007); HEPATITIS C (02/24/2007); BACK PAIN, CHRONIC (12/11/2009); S/P minimally invasive mitral valve repair (04/26/2014); echocardiogram; and Atrial flutter, post operative (06/04/2014).   He has past surgical history that includes Appendectomy; TEE without cardioversion (N/A, 03/20/2013); Mitral valve repair (Right, 04/26/2014); Intraoprative transesophageal echocardiogram (N/A, 04/26/2014); Cardioversion (N/A, 06/01/2014); and left and right heart catheterization with coronary angiogram (N/A, 02/19/2014).   His  family history is negative for Heart attack, Cancer, and Stroke.  He   reports that he has never smoked. He has never used smokeless tobacco. He reports that he drinks alcohol. He reports that he does not use illicit drugs.  Outpatient Prescriptions Prior to Visit  Medication Sig Dispense Refill  . aspirin EC 81 MG tablet Take 1 tablet (81 mg total) by mouth daily. (Patient not taking: Reported on 10/23/2015) 90 tablet 3  . losartan (COZAAR) 25 MG tablet Take 1 tablet (25 mg total) by mouth daily. (Patient not taking: Reported on 10/23/2015) 30 tablet 11   No  facility-administered medications prior to visit.    Social History   Social History  . Marital Status: Married    Spouse Name: N/A  . Number of Children: N/A  . Years of Education: N/A   Social History Main Topics  . Smoking status: Never Smoker   . Smokeless tobacco: Never Used  . Alcohol Use: Yes     Comment: week  . Drug Use: No  . Sexual Activity: Not Asked   Other Topics Concern  . None   Social History Narrative     Review of Systems  Constitutional: Negative for fever, chills and appetite change.  HENT: Negative for congestion, ear pain, postnasal drip, sinus pressure and sore throat.   Eyes: Negative for pain and redness.  Respiratory: Negative for cough, shortness of breath and wheezing.   Cardiovascular: Negative for leg swelling.  Gastrointestinal: Negative for nausea, vomiting, abdominal pain, diarrhea, constipation and blood in stool.  Endocrine: Negative for polyuria.  Genitourinary: Negative for dysuria, urgency, frequency and flank pain.  Musculoskeletal: Negative for gait problem.  Skin: Negative for rash.  Neurological: Negative for weakness and headaches.  Psychiatric/Behavioral: Negative for confusion and decreased concentration. The patient is not nervous/anxious.     Objective:  BP 130/80 mmHg  Pulse 81  Temp(Src) 99.2 F (37.3 C) (Oral)  Resp 20  Ht  (1.727 m)  Wt 163 lb 6.4 oz (74.118 kg)  BMI 24.85 kg/m2  SpO2 98%  Physical Exam  Constitutional: He is oriented to person, place, and time. He appears well-developed and well-nourished.  HENT:  Head: Normocephalic and atraumatic.  Eyes: Conjunctivae are normal. Pupils are equal, round, and reactive to light.  Pulmonary/Chest: Effort normal.  Musculoskeletal: He exhibits no edema.       Cervical back: He exhibits tenderness and spasm. He exhibits normal range of motion.  Neurological: He is alert and oriented to person, place, and time.  Skin: Skin is dry.  Psychiatric: He has a  normal mood and affect. His behavior is normal. Thought content normal.      Assessment & Plan:   Bradley Hansen was seen today for neck pain.  Diagnoses and all orders for this visit:  Neck pain -     DG Cervical Spine Complete; Future  Sciatic neuritis, unspecified laterality -     DG Lumbar Spine Complete; Future  Other orders -     naproxen sodium (ANAPROX DS) 550 MG tablet; Take 1 tablet (550 mg total) by mouth 2 (two) times daily with a meal. -     cyclobenzaprine (FLEXERIL) 10 MG tablet; Take 1 tablet (10 mg total) by mouth 3 (three) times daily as needed for muscle spasms.   I am having Bradley Hansen start on naproxen sodium and cyclobenzaprine. I am also having him maintain his losartan and aspirin EC.  Meds ordered this encounter  Medications  . naproxen sodium (ANAPROX DS) 550 MG tablet    Sig: Take 1 tablet (550 mg total) by mouth 2 (two) times daily with a meal.    Dispense:  40 tablet    Refill:  0  . cyclobenzaprine (FLEXERIL) 10 MG tablet    Sig: Take 1 tablet (10 mg total) by mouth 3 (three) times daily as needed for muscle spasms.    Dispense:  30 tablet    Refill:  0    Appropriate red flag conditions were discussed with the patient as well as actions that should be taken.  Patient expressed his understanding.  Follow-up: Return if symptoms worsen or fail to improve.  Carmelina Dane, MD   [facility] reading (PRIMARY) by  Dr. Dareen Piano.  Negative .

## 2015-10-23 NOTE — Patient Instructions (Signed)
Cervical Sprain  A cervical sprain is an injury in the neck in which the strong, fibrous tissues (ligaments) that connect your neck bones stretch or tear. Cervical sprains can range from mild to severe. Severe cervical sprains can cause the neck vertebrae to be unstable. This can lead to damage of the spinal cord and can result in serious nervous system problems. The amount of time it takes for a cervical sprain to get better depends on the cause and extent of the injury. Most cervical sprains heal in 1 to 3 weeks.  CAUSES   Severe cervical sprains may be caused by:    Contact sport injuries (such as from football, rugby, wrestling, hockey, auto racing, gymnastics, diving, martial arts, or boxing).    Motor vehicle collisions.    Whiplash injuries. This is an injury from a sudden forward and backward whipping movement of the head and neck.   Falls.   Mild cervical sprains may be caused by:    Being in an awkward position, such as while cradling a telephone between your ear and shoulder.    Sitting in a chair that does not offer proper support.    Working at a poorly designed computer station.    Looking up or down for long periods of time.   SYMPTOMS    Pain, soreness, stiffness, or a burning sensation in the front, back, or sides of the neck. This discomfort may develop immediately after the injury or slowly, 24 hours or more after the injury.    Pain or tenderness directly in the middle of the back of the neck.    Shoulder or upper back pain.    Limited ability to move the neck.    Headache.    Dizziness.    Weakness, numbness, or tingling in the hands or arms.    Muscle spasms.    Difficulty swallowing or chewing.    Tenderness and swelling of the neck.   DIAGNOSIS   Most of the time your health care provider can diagnose a cervical sprain by taking your history and doing a physical exam. Your health care provider will ask about previous neck injuries and any known neck  problems, such as arthritis in the neck. X-rays may be taken to find out if there are any other problems, such as with the bones of the neck. Other tests, such as a CT scan or MRI, may also be needed.   TREATMENT   Treatment depends on the severity of the cervical sprain. Mild sprains can be treated with rest, keeping the neck in place (immobilization), and pain medicines. Severe cervical sprains are immediately immobilized. Further treatment is done to help with pain, muscle spasms, and other symptoms and may include:   Medicines, such as pain relievers, numbing medicines, or muscle relaxants.    Physical therapy. This may involve stretching exercises, strengthening exercises, and posture training. Exercises and improved posture can help stabilize the neck, strengthen muscles, and help stop symptoms from returning.   HOME CARE INSTRUCTIONS    Put ice on the injured area.     Put ice in a plastic bag.     Place a towel between your skin and the bag.     Leave the ice on for 15-20 minutes, 3-4 times a day.    If your injury was severe, you may have been given a cervical collar to wear. A cervical collar is a two-piece collar designed to keep your neck from moving while it heals.      Do not remove the collar unless instructed by your health care provider.    If you have long hair, keep it outside of the collar.    Ask your health care provider before making any adjustments to your collar. Minor adjustments may be required over time to improve comfort and reduce pressure on your chin or on the back of your head.    Ifyou are allowed to remove the collar for cleaning or bathing, follow your health care provider's instructions on how to do so safely.    Keep your collar clean by wiping it with mild soap and water and drying it completely. If the collar you have been given includes removable pads, remove them every 1-2 days and hand wash them with soap and water. Allow them to air dry. They should be completely  dry before you wear them in the collar.    If you are allowed to remove the collar for cleaning and bathing, wash and dry the skin of your neck. Check your skin for irritation or sores. If you see any, tell your health care provider.    Do not drive while wearing the collar.    Only take over-the-counter or prescription medicines for pain, discomfort, or fever as directed by your health care provider.    Keep all follow-up appointments as directed by your health care provider.    Keep all physical therapy appointments as directed by your health care provider.    Make any needed adjustments to your workstation to promote good posture.    Avoid positions and activities that make your symptoms worse.    Warm up and stretch before being active to help prevent problems.   SEEK MEDICAL CARE IF:    Your pain is not controlled with medicine.    You are unable to decrease your pain medicine over time as planned.    Your activity level is not improving as expected.   SEEK IMMEDIATE MEDICAL CARE IF:    You develop any bleeding.   You develop stomach upset.   You have signs of an allergic reaction to your medicine.    Your symptoms get worse.    You develop new, unexplained symptoms.    You have numbness, tingling, weakness, or paralysis in any part of your body.   MAKE SURE YOU:    Understand these instructions.   Will watch your condition.   Will get help right away if you are not doing well or get worse.     This information is not intended to replace advice given to you by your health care provider. Make sure you discuss any questions you have with your health care provider.     Document Released: 10/11/2007 Document Revised: 12/19/2013 Document Reviewed: 06/21/2013  Elsevier Interactive Patient Education 2016 [employer] Inc[employer]

## 2015-11-19 ENCOUNTER — Ambulatory Visit (INDEPENDENT_AMBULATORY_CARE_PROVIDER_SITE_OTHER): Payer: BLUE CROSS/BLUE SHIELD | Admitting: Emergency Medicine

## 2015-11-19 ENCOUNTER — Emergency Department (HOSPITAL_COMMUNITY): Payer: BLUE CROSS/BLUE SHIELD

## 2015-11-19 ENCOUNTER — Encounter (HOSPITAL_COMMUNITY): Payer: Self-pay | Admitting: Emergency Medicine

## 2015-11-19 ENCOUNTER — Emergency Department (HOSPITAL_COMMUNITY)
Admission: EM | Admit: 2015-11-19 | Discharge: 2015-11-19 | Disposition: A | Discharge status: Home / Self Care | Payer: BLUE CROSS/BLUE SHIELD | Attending: Emergency Medicine | Admitting: Emergency Medicine

## 2015-11-19 VITALS — BP 124/80 | HR 100 | Temp 97.9°F | Resp 18 | Ht 65.0 in | Wt 158.0 lb

## 2015-11-19 DIAGNOSIS — Z8679 Personal history of other diseases of the circulatory system: Secondary | ICD-10-CM | POA: Insufficient documentation

## 2015-11-19 DIAGNOSIS — Z8639 Personal history of other endocrine, nutritional and metabolic disease: Secondary | ICD-10-CM | POA: Insufficient documentation

## 2015-11-19 DIAGNOSIS — R519 Headache, unspecified: Secondary | ICD-10-CM

## 2015-11-19 DIAGNOSIS — R269 Unspecified abnormalities of gait and mobility: Secondary | ICD-10-CM | POA: Diagnosis not present

## 2015-11-19 DIAGNOSIS — R42 Dizziness and giddiness: Secondary | ICD-10-CM | POA: Diagnosis not present

## 2015-11-19 DIAGNOSIS — M797 Fibromyalgia: Secondary | ICD-10-CM | POA: Insufficient documentation

## 2015-11-19 DIAGNOSIS — R51 Headache: Secondary | ICD-10-CM | POA: Insufficient documentation

## 2015-11-19 DIAGNOSIS — Z862 Personal history of diseases of the blood and blood-forming organs and certain disorders involving the immune mechanism: Secondary | ICD-10-CM | POA: Insufficient documentation

## 2015-11-19 DIAGNOSIS — R202 Paresthesia of skin: Secondary | ICD-10-CM

## 2015-11-19 DIAGNOSIS — Z8719 Personal history of other diseases of the digestive system: Secondary | ICD-10-CM | POA: Insufficient documentation

## 2015-11-19 DIAGNOSIS — Z87438 Personal history of other diseases of male genital organs: Secondary | ICD-10-CM | POA: Diagnosis not present

## 2015-11-19 DIAGNOSIS — G8929 Other chronic pain: Secondary | ICD-10-CM | POA: Insufficient documentation

## 2015-11-19 DIAGNOSIS — Z8619 Personal history of other infectious and parasitic diseases: Secondary | ICD-10-CM | POA: Insufficient documentation

## 2015-11-19 DIAGNOSIS — Z7982 Long term (current) use of aspirin: Secondary | ICD-10-CM | POA: Insufficient documentation

## 2015-11-19 DIAGNOSIS — Z9889 Other specified postprocedural states: Secondary | ICD-10-CM | POA: Diagnosis not present

## 2015-11-19 LAB — BASIC METABOLIC PANEL
Anion gap: 7 (ref 5–15)
BUN: 11 mg/dL (ref 6–20)
CO2: 28 mmol/L (ref 22–32)
Calcium: 10.2 mg/dL (ref 8.9–10.3)
Chloride: 102 mmol/L (ref 101–111)
Creatinine, Ser: 0.86 mg/dL (ref 0.61–1.24)
GFR calc Af Amer: >60 mL/min (ref >60)
GFR calc non Af Amer: >60 mL/min (ref >60)
Glucose, Bld: 107 mg/dL — ABNORMAL HIGH (ref 65–99)
Potassium: 4.3 mmol/L (ref 3.5–5.1)
Sodium: 137 mmol/L (ref 135–145)

## 2015-11-19 LAB — CBC
HCT: 43.7 % (ref 39.0–52.0)
Hemoglobin: 14.4 g/dL (ref 13.0–17.0)
MCH: 30.4 pg (ref 26.0–34.0)
MCHC: 33 g/dL (ref 30.0–36.0)
MCV: 92.2 fL (ref 78.0–100.0)
Platelets: 233 10*3/uL (ref 150–400)
RBC: 4.74 MIL/uL (ref 4.22–5.81)
RDW: 11.9 % (ref 11.5–15.5)
WBC: 5.6 10*3/uL (ref 4.0–10.5)

## 2015-11-19 MED ORDER — PROCHLORPERAZINE EDISYLATE 5 MG/ML IJ SOLN
5.0000 mg | Freq: Once | INTRAMUSCULAR | Status: AC
  Administered 2015-11-19: 5 mg via INTRAVENOUS
  Filled 2015-11-19: qty 2

## 2015-11-19 MED ORDER — PROCHLORPERAZINE MALEATE 5 MG PO TABS: 5.0000 mg | ORAL_TABLET | Freq: Three times a day (TID) | ORAL | Status: DC | PRN

## 2015-11-19 MED ORDER — PROCHLORPERAZINE EDISYLATE 5 MG/ML IJ SOLN: 5.0000 mg | Freq: Four times a day (QID) | INTRAMUSCULAR | Status: DC | PRN

## 2015-11-19 MED ORDER — SODIUM CHLORIDE 0.9 % IV BOLUS (SEPSIS)
1000.0000 mL | Freq: Once | INTRAVENOUS | Status: AC
  Administered 2015-11-19: 1000 mL via INTRAVENOUS

## 2015-11-19 NOTE — ED Notes (Signed)
Pt states after flying in from out of town last night at 12am be had a sudden onset on a headache and dizziness. Pt is not dizzy at this time states it keeps coming and going. Pt has a slight anterior headache 5/10. No blurry vision. Speech is clear, grips equal, pt ambulated to wheelchair in triage with a steady gait. No numbness or tingling. Pt is alert and ox4.

## 2015-11-19 NOTE — Discharge Instructions (Signed)
Your workup today was normal. You may take the compazine for your headaches as needed. Please call your primary care provider to f/u within one week. If you experience any new or concerning symptoms such as seizures, high fever, one-sided weakness, fainting, return to the ER immediately.    Take medications as prescribed. Return to the emergency room for worsening condition or new concerning symptoms. Follow up with your regular doctor. If you don't have a regular doctor use one of the numbers below to establish a primary care doctor.   Emergency Department Resource Guide 1) Find a Doctor and Pay Out of Pocket Although you won't have to find out who is covered by your insurance plan, it is a good idea to ask around and get recommendations. You will then need to call the office and see if the doctor you have chosen will accept you as a new patient and what types of options they offer for patients who are self-pay. Some doctors offer discounts or will set up payment plans for their patients who do not have insurance, but you will need to ask so you aren't surprised when you get to your appointment.  2) Contact Your Local Health Department Not all health departments have doctors that can see patients for sick visits, but many do, so it is worth a call to see if yours does. If you don't know where your local health department is, you can check in your phone book. The CDC also has a tool to help you locate your state's health department, and many state websites also have listings of all of their local health departments.  3) Find a Walk-in Clinic If your illness is not likely to be very severe or complicated, you may want to try a walk in clinic. These are popping up all over the country in pharmacies, drugstores, and shopping centers. They're usually staffed by nurse practitioners or physician assistants that have been trained to treat common illnesses and complaints. They're usually fairly quick and  inexpensive. However, if you have serious medical issues or chronic medical problems, these are probably not your best option.  No Primary Care Doctor: - Call Health Connect at  [phone] - they can help you locate a primary care doctor that  accepts your insurance, provides certain services, etc. - Physician Referral [phone]  Emergency Department Resource Guide 1) Find a Doctor and Pay Out of Pocket Although you won't have to find out who is covered by your insurance plan, it is a good idea to ask around and get recommendations. You will then need to call the office and see if the doctor you have chosen will accept you as a new patient and what types of options they offer for patients who are self-pay. Some doctors offer discounts or will set up payment plans for their patients who do not have insurance, but you will need to ask so you aren't surprised when you get to your appointment.  2) Contact Your Local Health Department Not all health departments have doctors that can see patients for sick visits, but many do, so it is worth a call to see if yours does. If you don't know where your local health department is, you can check in your phone book. The CDC also has a tool to help you locate your state's health department, and many state websites also have listings of all of their local health departments.  3) Find a Walk-in Clinic If your illness is not likely to  be very severe or complicated, you may want to try a walk in clinic. These are popping up all over the country in pharmacies, drugstores, and shopping centers. They're usually staffed by nurse practitioners or physician assistants that have been trained to treat common illnesses and complaints. They're usually fairly quick and inexpensive. However, if you have serious medical issues or chronic medical problems, these are probably not your best option.  No Primary Care Doctor: - Call Health Connect at  [phone] - they can  help you locate a primary care doctor that  accepts your insurance, provides certain services, etc. - Physician Referral Service- [phone]  Chronic Pain Problems: Organization         Address  Phone   Notes  [facility]  [phone] Patients need to be referred by their primary care doctor.   Medication Assistance: Organization         Address  Phone   Notes  [facility] [address] [place], [place] [address] [phone] --Must be a resident of [place] -- Must have NO insurance coverage whatsoever (no Medicaid/ Medicare, etc.) -- The pt. MUST have a primary care doctor that directs their care regularly and follows them in the community   [facility]  [phone]   [employer]  [phone]    Agencies that provide inexpensive medical care: Organization         Address  Phone   Notes  [facility]  [phone]   [facility]    [phone]   [facility] [address] [place], [place] [address] [phone]   [facility] [address], [place] [phone]   [facility]    [phone]   [facility]    [phone]   [facility]  [address], [place] Phone:  [phone], Fax:  [phone] Hours of Operation:  9 am - 6 pm, M-F.  Also accepts Medicaid/Medicare and self-pay.  [facility]  [address], [place] Phone: [phone], Fax: [phone]. Hours of Operation:  8:30 am - 5:30 pm, M-F.  Also accepts Medicaid and self-pay.  [facility] [place] [address], [place] Phone: [phone]   [facility] [address], [place], [place] [phone], Ext. [phone] Mondays & Thursdays: 7-9 AM.  First 15 patients are seen on a first come, first serve basis.    Medicaid-accepting [place] Providers:  Organization         Address  Phone   Notes  [facility] [address],  [phone] Also accepts self-pay patients.  [facility] [address], [place]  [phone]   [facility] [address], [place] [phone]   [facility] [address] [place] [address], [place] [phone]   [facility] RakersVe[facility] Bland [address], [place]   [phone] Only accepts [place] patients after they have their name applied to their card.   Self-Pay (no insurance) in [facility]:  Organization         Address  Phone   Notes  Sickle Cell Patients, [facility] [address], [place] [phone]   [facility] [address] [place], [place] [phone]   [facility]  [address], [place] [phone]   [facility]/Dr. Osei-Bonsu  [address] [place] or [address], [place] [phone] Phone number for both [place] and [place] locations is the same.  [facility] [address] [place] [phone]   [facility] [address], [place] or [address] [phone]   [facility] [address] [place] [phone], phone; [phone], fax Sees patients 1st and 3rd Saturday of every month.  Must not qualify for public or private insurance (i.e. Medicaid, Medicare, [place] Health Choice, Veterans' Benefits)  Household income should be no more than 200% of the poverty level The clinic cannot treat you if you are pregnant or think you are pregnant  Sexually transmitted diseases are not treated at the clinic.

## 2015-11-19 NOTE — Progress Notes (Signed)
Subjective:  Patient ID: Bradley Hansen, male    DOB: 1951-01-06  Age: 64 y.o. MRN: [id number]  CC: Headache and Dizziness   HPI CLEE COMAR presents  patient flew in from [place] last night after he arrived in [place] he developed a headache tabs headache is global. It is unrelenting. He was up sporadically during the night with headache and then developed dizziness on arising this morning associated with numbness in his left hand. He was so dizzy that he decided to go to the Hospital emergency room but was unable safely drive and stop here. He has no antecedent injury or illness. No cough coryza runny nose or throat fever chills no nausea or vomiting. No stool change. He has no ill contacts.  History Naomi has a past medical history of Mitral valve prolapse; Hypercholesteremia; Synovitis of hand (2011); Leukopenia; Rhinitis (2011); Hepatitis C (2004); Fibromyalgia; Prostatitis; Prostadynia; Mitral regurgitation; MITRAL VALVE PROLAPSE (02/24/2007); GASTROESOPHAGEAL REFLUX, NO ESOPHAGITIS (02/24/2007); HEPATITIS C (02/24/2007); BACK PAIN, CHRONIC (12/11/2009); S/P minimally invasive mitral valve repair (04/26/2014); echocardiogram; and Atrial flutter, post operative (06/04/2014).   He has past surgical history that includes Appendectomy; TEE without cardioversion (N/A, 03/20/2013); Mitral valve repair (Right, 04/26/2014); Intraoprative transesophageal echocardiogram (N/A, 04/26/2014); Cardioversion (N/A, 06/01/2014); and left and right heart catheterization with coronary angiogram (N/A, 02/19/2014).   His  family history is negative for Heart attack, Cancer, and Stroke.  He   reports that he has never smoked. He has never used smokeless tobacco. He reports that he drinks alcohol. He reports that he does not use illicit drugs.  Outpatient Prescriptions Prior to Visit  Medication Sig Dispense Refill  . aspirin EC 81 MG tablet Take 1 tablet (81 mg total) by mouth daily. 90 tablet 3  . cyclobenzaprine  (FLEXERIL) 10 MG tablet Take 1 tablet (10 mg total) by mouth 3 (three) times daily as needed for muscle spasms. (Patient not taking: Reported on 11/19/2015) 30 tablet 0  . losartan (COZAAR) 25 MG tablet Take 1 tablet (25 mg total) by mouth daily. (Patient not taking: Reported on 10/23/2015) 30 tablet 11  . naproxen sodium (ANAPROX DS) 550 MG tablet Take 1 tablet (550 mg total) by mouth 2 (two) times daily with a meal. (Patient not taking: Reported on 11/19/2015) 40 tablet 0   No facility-administered medications prior to visit.    Social History   Social History  . Marital Status: Married    Spouse Name: N/A  . Number of Children: N/A  . Years of Education: N/A   Social History Main Topics  . Smoking status: Never Smoker   . Smokeless tobacco: Never Used  . Alcohol Use: Yes     Comment: week  . Drug Use: No  . Sexual Activity: Not Asked   Other Topics Concern  . None   Social History Narrative     Review of Systems  Constitutional: Negative for fever, chills and appetite change.  HENT: Negative for congestion, ear pain, postnasal drip, sinus pressure and sore throat.   Eyes: Negative for pain and redness.  Respiratory: Negative for cough, shortness of breath and wheezing.   Cardiovascular: Negative for leg swelling.  Gastrointestinal: Negative for nausea, vomiting, abdominal pain, diarrhea, constipation and blood in stool.  Endocrine: Negative for polyuria.  Genitourinary: Negative for dysuria, urgency, frequency and flank pain.  Musculoskeletal: Negative for gait problem.  Skin: Negative for rash.  Neurological: Positive for dizziness, numbness and headaches. Negative for weakness.  Psychiatric/Behavioral: Negative for confusion and  decreased concentration. The patient is not nervous/anxious.     Objective:  BP 124/80 mmHg  Pulse 100  Temp(Src) 97.9 F (36.6 C) (Oral)  Resp 18  Ht  (1.651 m)  Wt 158 lb (71.668 kg)  BMI 26.29 kg/m2  SpO2 99%  Physical Exam   Constitutional: He is oriented to person, place, and time. He appears well-developed and well-nourished. No distress.  HENT:  Head: Normocephalic and atraumatic.  Right Ear: External ear normal.  Left Ear: External ear normal.  Nose: Nose normal.  Eyes: Conjunctivae and EOM are normal. Pupils are equal, round, and reactive to light. No scleral icterus.  Neck: Normal range of motion. Neck supple. No tracheal deviation present.  Cardiovascular: Normal rate, regular rhythm and normal heart sounds.   Pulmonary/Chest: Effort normal. No respiratory distress. He has no wheezes. He has no rales.  Abdominal: He exhibits no mass. There is no tenderness. There is no rebound and no guarding.  Musculoskeletal: He exhibits no edema.  Lymphadenopathy:    He has no cervical adenopathy.  Neurological: He is alert and oriented to person, place, and time. A sensory deficit is present. No cranial nerve deficit. He exhibits abnormal muscle tone. Coordination and gait abnormal.  Skin: Skin is warm and dry. No rash noted.  Psychiatric: He has a normal mood and affect. His behavior is normal.   He has an impaired tandem gait and left arm flexion and extension and grip weakness.   Assessment & Plan:   Enrico was seen today for headache and dizziness.  Diagnoses and all orders for this visit:  Dizziness and giddiness -     EKG 12-Lead  Impaired gait -     EKG 12-Lead  Paresthesia of left arm -     EKG 12-Lead   I am having Mr. Schauf maintain his losartan, aspirin EC, naproxen sodium, and cyclobenzaprine.  No orders of the defined types were placed in this encounter.   Decision made was made to transfer from the emergency room by ambulance and was competent without untoward event  Appropriate red flag conditions were discussed with the patient as well as actions that should be taken.  Patient expressed his understanding.  Follow-up: Return if symptoms worsen or fail to improve.  Carmelina Dane, MD

## 2015-11-19 NOTE — ED Provider Notes (Signed)
CSN: [id number]     Arrival date & time 11/19/15  1426 History   First MD Initiated Contact with Patient 11/19/15 1526     Chief Complaint  Patient presents with  . Dizziness    HPI   Bradley Hansen is an 64 y.o. male with h/o MVP s/p repair 2015, HLD, hep c, chronic back pain, who prsents to the ED for evaluation of headache. He reports he was in his usual state of health until yesterday when he flew back from [place]. He states he got back around midnight and since then has had a bad diffuse headache. States that the headache came on suddenly but was not that bad but overnight has progressively gotten worse. He states that it now comes and goes in waves and sometimes will be present and sometimes it won't. States the pain is all over his head. He states that earlier today he also felt dizzy. Felt like the room was spinning around him. Denies photophobia, phonophobia, tinnitus. Denies n/v/d. Denies chest pain or sob. Denies fever, chills, neck rigidity.    Past Medical History  Diagnosis Date  . Mitral valve prolapse     TEE  08/2010 severe MR, bileaflet MVP, EF 60%  . Hypercholesteremia   . Synovitis of hand 2011    right hand  . Leukopenia   . Rhinitis 2011    chronic   . Hepatitis C 2004    pt doesn't want treatment  . Fibromyalgia   . Prostatitis   . Prostadynia   . Mitral regurgitation   . MITRAL VALVE PROLAPSE 02/24/2007    Qualifier: Diagnosis of  By: Abundio Miu    . GASTROESOPHAGEAL REFLUX, NO ESOPHAGITIS 02/24/2007    Qualifier: Diagnosis of  By: Abundio Miu    . HEPATITIS C 02/24/2007    Qualifier: Diagnosis of  By: Abundio Miu    . BACK PAIN, CHRONIC 12/11/2009    Qualifier: Diagnosis of  By: Mauricio Po MD, Fayrene Fearing    . S/P minimally invasive mitral valve repair 04/26/2014    Complex valvuloplasty including artificial Gore-tex neocord placement x8, bovine pericardial patch augmentation of P3 portion of posterior leaflet and 36 mm Sorin Memo 3D ring annuloplasty via  right mini thoracotomy  . Hx of echocardiogram     Echo (04/2014):  EF 45-50%, diff HK, mild AI, MV repair ok (mean 5 mmHg), mod LAE, mild RAE, mild late TV systolic prolapse  . Atrial flutter, post operative 06/04/2014   Past Surgical History  Procedure Laterality Date  . Appendectomy    . Tee without cardioversion N/A 03/20/2013    Procedure: TRANSESOPHAGEAL ECHOCARDIOGRAM (TEE);  Surgeon: [name], MD;  Location: [facility] ENDOSCOPY;  Service: Cardiovascular;  Laterality: N/A;  . Mitral valve repair Right 04/26/2014    Procedure: MINIMALLY INVASIVE MITRAL VALVE REPAIR (MVR);  Surgeon: Purcell Nails, MD;  [facility];  Service: Open Heart Surgery;  Laterality: Right;  . Intraoperative transesophageal echocardiogram N/A 04/26/2014    Procedure: INTRAOPERATIVE TRANSESOPHAGEAL ECHOCARDIOGRAM;  Surgeon: Purcell Nails, MD;  Location: [facility] OR;  Service: Open Heart Surgery;  Laterality: N/A;  . Cardioversion N/A 06/01/2014    Procedure: CARDIOVERSION;  Surgeon: [name], MD;  Location: [facility] ENDOSCOPY;  Service: Cardiovascular;  Laterality: N/A;  . Left and right heart catheterization with coronary angiogram N/A 02/19/2014    Procedure: LEFT AND RIGHT HEART CATHETERIZATION WITH CORONARY ANGIOGRAM;  Surgeon: Corky Crafts, MD;  Location[facility] CATH LAB;  Service: Cardiovascular;  Laterality:  N/A;   Family History  Problem Relation Age of Onset  . Heart attack Neg Hx   . Cancer Neg Hx   . Stroke Neg Hx    Social History  Substance Use Topics  . Smoking status: Never Smoker   . Smokeless tobacco: Never Used  . Alcohol Use: Yes     Comment: week    Review of Systems  All other systems reviewed and are negative.     Allergies  Review of patient's allergies indicates no known allergies.  Home Medications   Prior to Admission medications   Medication Sig Start Date End Date Taking? Authorizing Provider  aspirin EC 81 MG tablet Take 1 tablet (81 mg total) by mouth daily. 09/09/15   Yes Rollene Rotunda, MD  cyclobenzaprine (FLEXERIL) 10 MG tablet Take 1 tablet (10 mg total) by mouth 3 (three) times daily as needed for muscle spasms. Patient not taking: Reported on 11/19/2015 10/23/15   Carmelina Dane, MD  losartan (COZAAR) 25 MG tablet Take 1 tablet (25 mg total) by mouth daily. Patient not taking: Reported on 10/23/2015 06/11/14   Beatrice Lecher, PA-C  naproxen sodium (ANAPROX DS) 550 MG tablet Take 1 tablet (550 mg total) by mouth 2 (two) times daily with a meal. Patient not taking: Reported on 11/19/2015 10/23/15 10/22/16  Carmelina Dane, MD   BP 137/96 mmHg  Pulse 89  Temp(Src) 98 F (36.7 C) (Oral)  Resp 21  Ht 5\' 7"  (1.702 m)  Wt 71.668 kg  BMI 24.74 kg/m2  SpO2 99% Physical Exam  Constitutional: He is oriented to person, place, and time. No distress.  HENT:  Head: Atraumatic.  Right Ear: External ear normal.  Left Ear: External ear normal.  Nose: Nose normal.  Mouth/Throat: Oropharynx is clear and moist and mucous membranes are normal. No oropharyngeal exudate.  Eyes: Conjunctivae and EOM are normal. Pupils are equal, round, and reactive to light.  Neck: Normal range of motion. Neck supple. No rigidity.  Cardiovascular: Normal rate, regular rhythm and normal heart sounds.   Pulmonary/Chest: Effort normal and breath sounds normal. No respiratory distress. He has no wheezes.  Abdominal: Soft. Bowel sounds are normal. He exhibits no distension. There is no tenderness.  Musculoskeletal: Normal range of motion. He exhibits no edema or tenderness.  Neurological: He is alert and oriented to person, place, and time. He has normal strength. No cranial nerve deficit or sensory deficit. He displays a negative Romberg sign. Coordination and gait normal.  Skin: Skin is warm and dry. He is not diaphoretic.  Psychiatric: He has a normal mood and affect.  Nursing note and vitals reviewed.   ED Course  Procedures (including critical care time) Labs  Review Labs Reviewed  BASIC METABOLIC PANEL - Abnormal; Notable for the following:    Glucose, Bld 107 (*)    All other components within normal limits  CBC  URINALYSIS, ROUTINE W REFLEX MICROSCOPIC (NOT AT [facility])    Imaging Review Ct Head Wo Contrast  11/19/2015  CLINICAL DATA:  Sudden onset headache and dizziness. EXAM: CT HEAD WITHOUT CONTRAST TECHNIQUE: Contiguous axial images were obtained from the base of the skull through the vertex without intravenous contrast. COMPARISON:  None. FINDINGS: The brainstem, cerebellum, cerebral peduncles, thalami, basal ganglia, basilar cisterns, and ventricular system appear within normal limits. Mild periventricular white matter and corona radiata hypodensities favor chronic ischemic microvascular white matter disease. No intracranial hemorrhage, mass lesion, or acute CVA. IMPRESSION: 1. Mild chronic ischemic microvascular white  matter disease. Otherwise, no significant abnormalities are observed. Electronically Signed   By: Gaylyn Rong M.D.   On: 11/19/2015 16:54   I have personally reviewed and evaluated these images and lab results as part of my medical decision-making.   EKG Interpretation None      MDM   Final diagnoses:  Acute nonintractable headache, unspecified headache type    Completely nonfocal exam. Entire neuro exam intact. Steady gait. No nystagmus. No focal findings. VSS. BMP and CBC unremarkable. However, given new headache with dizziness will CT non con head.   CT unremarkable. Pt reports relief with 1L NS and compazine. Will d/c home with strict return precautions. Pt verbalized agreement.     Carlene Coria, PA-C 11/21/15 0111  Alvira Monday, MD 11/21/15 2133

## 2016-04-06 DIAGNOSIS — Z0000 Encounter for general adult medical examination without abnormal findings: Secondary | ICD-10-CM | POA: Diagnosis not present

## 2016-04-06 DIAGNOSIS — E559 Vitamin D deficiency, unspecified: Secondary | ICD-10-CM | POA: Diagnosis not present

## 2016-04-06 DIAGNOSIS — N138 Other obstructive and reflux uropathy: Secondary | ICD-10-CM | POA: Diagnosis not present

## 2016-04-06 DIAGNOSIS — N401 Enlarged prostate with lower urinary tract symptoms: Secondary | ICD-10-CM | POA: Diagnosis not present

## 2016-04-13 ENCOUNTER — Ambulatory Visit (HOSPITAL_COMMUNITY): Payer: BLUE CROSS/BLUE SHIELD | Attending: Cardiology

## 2016-04-13 ENCOUNTER — Other Ambulatory Visit: Payer: Self-pay

## 2016-04-13 DIAGNOSIS — I059 Rheumatic mitral valve disease, unspecified: Secondary | ICD-10-CM | POA: Diagnosis not present

## 2016-04-13 DIAGNOSIS — I351 Nonrheumatic aortic (valve) insufficiency: Secondary | ICD-10-CM | POA: Insufficient documentation

## 2016-04-13 DIAGNOSIS — I340 Nonrheumatic mitral (valve) insufficiency: Secondary | ICD-10-CM | POA: Insufficient documentation

## 2016-11-02 DIAGNOSIS — E559 Vitamin D deficiency, unspecified: Secondary | ICD-10-CM | POA: Diagnosis not present

## 2016-11-24 DIAGNOSIS — Z0000 Encounter for general adult medical examination without abnormal findings: Secondary | ICD-10-CM | POA: Diagnosis not present

## 2016-11-30 ENCOUNTER — Ambulatory Visit: Payer: BLUE CROSS/BLUE SHIELD | Admitting: Cardiology

## 2016-12-13 NOTE — Progress Notes (Signed)
HPI The patient presents for followup of minimally invasive mitral valve repair by Dr. Cornelius Moras. He had postoperative atrial fibrillation and DCCV.  He was on amiodarone. This was discontinued secondary to bradycardia. However, at followup with Dr. Cornelius Moras was noted to be back in atrial flutter. He was again placed on amiodarone.  He converted on this back into sinus.  However follow up EKG demonstrated a markedly abnormal EKG with QT prolongation and T wave inversion.  I stopped the amiodarone.  Follow up EKG demonstrated resolution of these changes.   He has a mildly reduced EF but stable repair on echo in Apirl of this year.  MV repair was stable as well.  At the last appt I stopped his anticoagulation.  He has done well since then. He does a little walking but not much in the winter. He denies any chest pressure, neck or arm discomfort. He denies shortness of breath, PND or orthopnea. He's had no palpitations, presyncope or syncope. He's never felt his heart beating out of rhythm since his initial surgery.  No Known Allergies  Current Outpatient Prescriptions  Medication Sig Dispense Refill  . aspirin EC 81 MG tablet Take 1 tablet (81 mg total) by mouth daily. 90 tablet 3  . amoxicillin (AMOXIL) 500 MG tablet Take 1 tablet (500 mg total) by mouth daily. 4 tablet 0   No current facility-administered medications for this visit.     Past Medical History:  Diagnosis Date  . Atrial flutter, post operative 06/04/2014  . BACK PAIN, CHRONIC 12/11/2009   Qualifier: Diagnosis of  By: Mauricio Po MD, Fayrene Fearing    . Fibromyalgia   . GASTROESOPHAGEAL REFLUX, NO ESOPHAGITIS 02/24/2007   Qualifier: Diagnosis of  By: Abundio Miu    . HEPATITIS C 02/24/2007   Qualifier: Diagnosis of  By: Abundio Miu    . Hx of echocardiogram    Echo (04/2014):  EF 45-50%, diff HK, mild AI, MV repair ok (mean 5 mmHg), mod LAE, mild RAE, mild late TV systolic prolapse  . Hypercholesteremia   . Leukopenia   . Mitral  regurgitation   . Mitral valve prolapse    TEE  08/2010 severe MR, bileaflet MVP, EF 60%  . Prostadynia   . Prostatitis   . Rhinitis 2011   chronic   . S/P minimally invasive mitral valve repair 04/26/2014   Complex valvuloplasty including artificial Gore-tex neocord placement x8, bovine pericardial patch augmentation of P3 portion of posterior leaflet and 36 mm Sorin Memo 3D ring annuloplasty via right mini thoracotomy  . Synovitis of hand 2011   right hand    Past Surgical History:  Procedure Laterality Date  . APPENDECTOMY    . CARDIOVERSION N/A 06/01/2014   Procedure: CARDIOVERSION;  Surgeon: [name], MD;  Location: [facility] ENDOSCOPY;  Service: Cardiovascular;  Laterality: N/A;  . INTRAOPERATIVE TRANSESOPHAGEAL ECHOCARDIOGRAM N/A 04/26/2014   Procedure: INTRAOPERATIVE TRANSESOPHAGEAL ECHOCARDIOGRAM;  Surgeon: Purcell Nails, MD;  Location: [facility] OR;  Service: Open Heart Surgery;  Laterality: N/A;  . LEFT AND RIGHT HEART CATHETERIZATION WITH CORONARY ANGIOGRAM N/A 02/19/2014   Procedure: LEFT AND RIGHT HEART CATHETERIZATION WITH CORONARY ANGIOGRAM;  Surgeon: Corky Crafts, MD;  Location: [facility] CATH LAB;  Service: Cardiovascular;  Laterality: N/A;  . MITRAL VALVE REPAIR Right 04/26/2014   Procedure: MINIMALLY INVASIVE MITRAL VALVE REPAIR (MVR);  Surgeon: Purcell Nails, MD;  Location[facility] OR;  Service: Open Heart Surgery;  Laterality: Right;  . TEE WITHOUT CARDIOVERSION N/A 03/20/2013   Procedure:  TRANSESOPHAGEAL ECHOCARDIOGRAM (TEE);  Surgeon: Vesta MixerPhilip J Nahser, MD;  Location[facility] ENDOSCOPY;  Service: Cardiovascular;  Laterality: N/A;    ROS:       As stated in the HPI and negative for all other systems.  PHYSICAL EXAM BP 138/88 (BP Location: Left Arm, Patient Position: Sitting, Cuff Size: Normal)   Pulse 91   Ht 5\' 7"  (1.702 m)   Wt 165 lb (74.8 kg)   BMI 25.84 kg/m  GENERAL:  Well appearing NECK:  No jugular venous distention, waveform within normal limits, carotid upstroke brisk  and symmetric, no bruits, no thyromegaly LUNGS:  Clear to auscultation bilaterally BACK:  No CVA tenderness CHEST:  Unremarkable HEART:  PMI not displaced or sustained,S1 and S2 within normal limits, no S3, no S4, positive click, no rubs, harsh holosystolic murmur heard at the apex  ABD:  Flat, positive bowel sounds normal in frequency in pitch, no bruits, no rebound, no guarding, no midline pulsatile mass, no hepatomegaly, no splenomegaly EXT:  2 plus pulses throughout, no edema, no cyanosis no clubbing  EKG:  Sinus rhythm, rate 80,  No acute ST T wave changes.   12/14/2016  ASSESSMENT AND PLAN  ATRIAL FLUTTER:    He was in sinus at the last visit and anticoagulation was stopped.  No change in therapy is indicated.  Marland Kitchen.  MITRAL VALVE REPAIR:  Echo in April demonstrated stable MVR.  I'll see him next year. I'll likely order an echocardiogram after that.He is aware and follows SBE prophylaxis   MILDLY REDUCED EF:   For now he will continue on the meds as listed.  EF was 45% in April  this will be followed up clinically and with echocardiograms.

## 2016-12-14 ENCOUNTER — Ambulatory Visit (INDEPENDENT_AMBULATORY_CARE_PROVIDER_SITE_OTHER): Payer: BLUE CROSS/BLUE SHIELD | Admitting: Cardiology

## 2016-12-14 ENCOUNTER — Telehealth: Payer: Self-pay | Admitting: *Deleted

## 2016-12-14 ENCOUNTER — Encounter: Payer: Self-pay | Admitting: Cardiology

## 2016-12-14 VITALS — BP 138/88 | HR 91 | Ht 67.0 in | Wt 165.0 lb

## 2016-12-14 DIAGNOSIS — I420 Dilated cardiomyopathy: Secondary | ICD-10-CM | POA: Diagnosis not present

## 2016-12-14 DIAGNOSIS — Z9889 Other specified postprocedural states: Secondary | ICD-10-CM

## 2016-12-14 DIAGNOSIS — I483 Typical atrial flutter: Secondary | ICD-10-CM | POA: Diagnosis not present

## 2016-12-14 DIAGNOSIS — I340 Nonrheumatic mitral (valve) insufficiency: Secondary | ICD-10-CM | POA: Diagnosis not present

## 2016-12-14 MED ORDER — AMOXICILLIN 500 MG PO TABS: 500.0000 mg | ORAL_TABLET | Freq: Every day | ORAL | 0 refills | Status: DC

## 2016-12-14 NOTE — Telephone Encounter (Signed)
Spoke with pharmacist, letting him know it's for dental procedure

## 2016-12-14 NOTE — Patient Instructions (Signed)
Medication Instructions:  START- Amoxicillin 500 mg take all 4 tablets 1 hour before procedure   Labwork: None Ordered  Testing/Procedures: None Ordered  Follow-Up: Your physician wants you to follow-up in: 1 Year. You will receive a reminder letter in the mail two months in advance. If you don't receive a letter, please call our office to schedule the follow-up appointment.   Any Other Special Instructions Will Be Listed Below (If Applicable).   If you need a refill on your cardiac medications before your next appointment, please call your pharmacy.

## 2016-12-25 DIAGNOSIS — Z8619 Personal history of other infectious and parasitic diseases: Secondary | ICD-10-CM | POA: Diagnosis not present

## 2016-12-25 DIAGNOSIS — R7989 Other specified abnormal findings of blood chemistry: Secondary | ICD-10-CM | POA: Diagnosis not present

## 2017-01-04 ENCOUNTER — Ambulatory Visit (INDEPENDENT_AMBULATORY_CARE_PROVIDER_SITE_OTHER): Payer: BLUE CROSS/BLUE SHIELD | Admitting: Emergency Medicine

## 2017-01-04 VITALS — BP 110/66 | HR 86 | Temp 98.8°F | Resp 18 | Ht 67.0 in | Wt 160.0 lb

## 2017-01-04 DIAGNOSIS — M65222 Calcific tendinitis, left upper arm: Secondary | ICD-10-CM

## 2017-01-04 DIAGNOSIS — M79622 Pain in left upper arm: Secondary | ICD-10-CM

## 2017-01-04 MED ORDER — DICLOFENAC SODIUM 75 MG PO TBEC: 75.0000 mg | DELAYED_RELEASE_TABLET | Freq: Two times a day (BID) | ORAL | 0 refills | Status: DC

## 2017-01-04 NOTE — Progress Notes (Signed)
Bradley Hansen 66 y.o.   Chief Complaint  Patient presents with  . Arm Pain    LEFT FOR 1 WEEK    HISTORY OF PRESENT ILLNESS: This is a 66 y.o. male complaining of left upper arm pain x 1 week.   Arm Pain   The incident occurred 5 to 7 days ago. There was no injury mechanism. The pain is present in the upper left arm. The quality of the pain is described as aching. The pain does not radiate. The pain is at a severity of 5/10. The pain is moderate. The pain has been constant since the incident. Pertinent negatives include no muscle weakness, numbness or tingling. The symptoms are aggravated by movement, lifting and palpation. He has tried nothing for the symptoms.     Prior to Admission medications   Medication Sig Start Date End Date Taking? Authorizing Provider  aspirin EC 81 MG tablet Take 1 tablet (81 mg total) by mouth daily. 09/09/15   Rollene Rotunda, MD  diclofenac (VOLTAREN) 75 MG EC tablet Take 1 tablet (75 mg total) by mouth 2 (two) times daily. 01/04/17 01/11/17  Georgina Quint, MD    No Known Allergies  Patient Active Problem List   Diagnosis Date Noted  . S/P MVR (mitral valve repair) 07/02/2014  . Atrial flutter, post operative 06/04/2014  . Encounter for therapeutic drug monitoring 05/28/2014  . S/P minimally invasive mitral valve repair 04/26/2014  . Mitral valve prolapse 03/26/2014  . Mitral regurgitation   . Annual physical exam 02/01/2012  . ATHEROSCLEROSIS OF AORTA 12/01/2010  . SHOULDER PAIN, RIGHT 12/01/2010  . BACK PAIN, CHRONIC 12/11/2009  . HEPATITIS C 02/24/2007  . MITRAL VALVE PROLAPSE 02/24/2007  . GASTROESOPHAGEAL REFLUX, NO ESOPHAGITIS 02/24/2007    Past Medical History:  Diagnosis Date  . Atrial flutter, post operative 06/04/2014  . BACK PAIN, CHRONIC 12/11/2009   Qualifier: Diagnosis of  By: Mauricio Po MD, Fayrene Fearing    . Fibromyalgia   . GASTROESOPHAGEAL REFLUX, NO ESOPHAGITIS 02/24/2007   Qualifier: Diagnosis of  By: Abundio Miu    .  HEPATITIS C 02/24/2007   Qualifier: Diagnosis of  By: Abundio Miu    . Hx of echocardiogram    Echo (04/2014):  EF 45-50%, diff HK, mild AI, MV repair ok (mean 5 mmHg), mod LAE, mild RAE, mild late TV systolic prolapse  . Hypercholesteremia   . Leukopenia   . Mitral regurgitation   . Mitral valve prolapse    TEE  08/2010 severe MR, bileaflet MVP, EF 60%  . Prostadynia   . Prostatitis   . Rhinitis 2011   chronic   . S/P minimally invasive mitral valve repair 04/26/2014   Complex valvuloplasty including artificial Gore-tex neocord placement x8, bovine pericardial patch augmentation of P3 portion of posterior leaflet and 36 mm Sorin Memo 3D ring annuloplasty via right mini thoracotomy  . Synovitis of hand 2011   right hand    Past Surgical History:  Procedure Laterality Date  . APPENDECTOMY    . CARDIOVERSION N/A 06/01/2014   Procedure: CARDIOVERSION;  Surgeon: [name], MD;  Location: [facility] ENDOSCOPY;  Service: Cardiovascular;  Laterality: N/A;  . INTRAOPERATIVE TRANSESOPHAGEAL ECHOCARDIOGRAM N/A 04/26/2014   Procedure: INTRAOPERATIVE TRANSESOPHAGEAL ECHOCARDIOGRAM;  Surgeon: Purcell Nails, MD;  Location: [facility] OR;  Service: Open Heart Surgery;  Laterality: N/A;  . LEFT AND RIGHT HEART CATHETERIZATION WITH CORONARY ANGIOGRAM N/A 02/19/2014   Procedure: LEFT AND RIGHT HEART CATHETERIZATION WITH CORONARY ANGIOGRAM;  Surgeon: Donnie Coffin  Eldridge Dace, MD;  Location: [facility] CATH LAB;  Service: Cardiovascular;  Laterality: N/A;  . MITRAL VALVE REPAIR Right 04/26/2014   Procedure: MINIMALLY INVASIVE MITRAL VALVE REPAIR (MVR);  Surgeon: Purcell Nails, MD;  Location: [facility] OR;  Service: Open Heart Surgery;  Laterality: Right;  . TEE WITHOUT CARDIOVERSION N/A 03/20/2013   Procedure: TRANSESOPHAGEAL ECHOCARDIOGRAM (TEE);  Surgeon: [name], MD;  Location: [facility] ENDOSCOPY;  Service: Cardiovascular;  Laterality: N/A;    Social History   Social History  . Marital status: Married    Spouse name: N/A    . Number of children: N/A  . Years of education: N/A   Occupational History  . Not on file.   Social History Main Topics  . Smoking status: Never Smoker  . Smokeless tobacco: Never Used  . Alcohol use Yes     Comment: week  . Drug use: No  . Sexual activity: Not on file   Other Topics Concern  . Not on file   Social History Narrative  . No narrative on file    Family History  Problem Relation Age of Onset  . Heart attack Neg Hx   . Cancer Neg Hx   . Stroke Neg Hx      Review of Systems  Constitutional: Negative for chills, diaphoresis, fever and weight loss.  HENT: Negative.   Eyes: Negative.   Respiratory: Negative for cough, hemoptysis and shortness of breath.   Cardiovascular: Negative for palpitations, orthopnea and leg swelling.  Gastrointestinal: Negative for abdominal pain, diarrhea, nausea and vomiting.  Genitourinary: Negative.   Musculoskeletal: Negative for back pain, joint pain, myalgias and neck pain.       Pain to left upper bicep/tricep area reproducible with palpation and movement  Skin: Negative for rash.  Neurological: Negative.  Negative for tingling, sensory change, focal weakness, numbness and headaches.  Endo/Heme/Allergies: Negative.   Psychiatric/Behavioral: Negative.   All other systems reviewed and are negative.  Vitals:   01/04/17 1526  BP: 110/66  Pulse: 86  Resp: 18  Temp: 98.8 F (37.1 C)     Physical Exam  Constitutional: He is oriented to person, place, and time. He appears well-developed and well-nourished.  HENT:  Head: Normocephalic and atraumatic.  Nose: Nose normal.  Mouth/Throat: Oropharynx is clear and moist.  Eyes: Conjunctivae and EOM are normal. Pupils are equal, round, and reactive to light.  Neck: Normal range of motion. Neck supple.  Cardiovascular: Normal rate, regular rhythm and normal heart sounds.   Pulmonary/Chest: Effort normal and breath sounds normal.  Abdominal: Soft. He exhibits no distension.  There is no tenderness.  Musculoskeletal:  Left UE: +tenderness at biceps/triceps proximal insertion areas  Neurological: He is alert and oriented to person, place, and time.  Skin: Skin is warm and dry. Capillary refill takes less than 2 seconds.  Psychiatric: He has a normal mood and affect. His behavior is normal.  Vitals reviewed.    ASSESSMENT & PLAN: Corran was seen today for arm pain.  Diagnoses and all orders for this visit:  Calcific tendonitis of left upper arm  Left upper arm pain  Other orders -     diclofenac (VOLTAREN) 75 MG EC tablet; Take 1 tablet (75 mg total) by mouth 2 (two) times daily.    Patient Instructions       IF you received an x-ray today, you will receive an invoice from [facility]. Please contact [facility] at [phone] with questions or concerns regarding your invoice.  IF you received labwork today, you will receive an invoice from [place]. Please contact [facility] at [phone] with questions or concerns regarding your invoice.   Our billing staff will not be able to assist you with questions regarding bills from these companies.  You will be contacted with the lab results as soon as they are available. The fastest way to get your results is to activate your My Chart account. Instructions are located on the last page of this paperwork. If you have not heard from [place] regarding the results in 2 weeks, please contact this office.     Calcific Tendinitis Calcific tendinitis occurs when crystals of calcium are deposited in a tendon. Tendons are tough, cord-like tissues that connect muscle to bone. Tendons are an important part of joints. They make joints move, and they absorb some of the stress that a joint receives during use. When calcium is deposited in the tendon, the tendon becomes stiff and painful and it can become swollen. Calcific tendinitis occurs frequently in a tendon in the shoulder joint (rotator cuff). What  are the causes? The cause of calcific tendinitis is not known. It may be associated with:  Overusing a tendon, such as from repetitive motion.  Excess stress on the tendon.  Age-related wear and tear.  Repetitive, mild injuries. What increases the risk? This condition is more likely to develop in:  People who do activities that involve repetitive motions.  Older people. What are the signs or symptoms? This condition may or may not be painful. If there is pain, it may occur when moving the joint. Other symptoms may include:  Tenderness when pressure is applied to the tendon.  A snapping or popping sound when the joint moves.  Decreased motion of the joint.  Difficulty sleeping due to pain in the joint. How is this diagnosed? This condition is diagnosed with a physical exam. You may also have tests, such as:  X-rays.  MRI.  CT scan. How is this treated? This condition generally gets better on its own. Treatment may also include:  Resting, icing, applying pressure (compression), and raising (elevating) the area above the level of your heart. This is known as RICE therapy.  Medicines to help reduce inflammation or to help reduce pain.  Physical therapy to strengthen and stretch the tendon.  Following a specific exercise program to keep the joint working properly. Treatment for more severe calcific tendinitis may require:  Injecting steroids or pain-relieving medicines into or around the joint.  Manipulating the joint after you are given medicine to numb the area (local anesthetic).  Inflating the joint with sterile fluid to increase the flexibility of the tendons.  Shock wave therapy, which involves focusing sound waves on the joint. If other treatments do not work, surgery may be done to clean out the calcium deposits and repair the tendon as needed. Most people do not need surgery. Follow these instructions at home: Managing pain, stiffness, and swelling  If  directed, put heat on the affected area before you exercise or as often as told by your health care provider. Use the heat source that your health care provider recommends, such as a moist heat pack or a heating pad.  Place a towel between your skin and the heat source.  Leave the heat on for 20-30 minutes.  Remove the heat if your skin turns bright red. This is especially important if you are unable to feel pain, heat, or cold. You may have a greater risk of getting  burned.  Move the fingers or toes of the affected limb often, if this applies. This can help to prevent stiffness and lessen swelling.  If directed, elevate the affected area above the level of your heart while you are sitting or lying down.  If directed, put ice on the affected area:  Put ice in a plastic bag.  Place a towel between your skin and the bag.  Leave the ice on for 20 minutes, 2-3 times a day. General instructions  Take over-the-counter and prescription medicines only as told by your health care provider.  Do not drive or use heavy machinery while taking prescription pain medicine.  Follow recommendations from your health care provider for activity and exercise. Ask your health care provider what activities are safe for you.  Avoid using the affected area while you are experiencing symptoms of tendinitis.  Wear an elastic bandage or compression wrap only as told by your health care provider.  Keep all follow-up visits as told by your health care provider. This is important. Contact a health care provider if:  You have pain or numbness that gets worse.  You develop new weakness.  You notice increased joint stiffness or a sensation of looseness in the joint.  You notice increasing redness, swelling, or warmth around the joint area. Get help right away if:  You have a fever for more than 2-3 days.  You have symptoms for more than 2-3 days.  You have a fever and your symptoms suddenly get  worse. This information is not intended to replace advice given to you by your health care provider. Make sure you discuss any questions you have with your health care provider. Document Released: 09/22/2008 Document Revised: 09/07/2016 Document Reviewed: 09/07/2016 Elsevier Interactive Patient Education  2017 [employer]      Edwina Barth, MD [facility]

## 2017-01-04 NOTE — Patient Instructions (Addendum)
IF you received an x-ray today, you will receive an invoice from [facility] Radiology. Please contact [facility] Radiology at [phone] with questions or concerns regarding your invoice.   IF you received labwork today, you will receive an invoice from [place]. Please contact [facility] at [phone] with questions or concerns regarding your invoice.   Our billing staff will not be able to assist you with questions regarding bills from these companies.  You will be contacted with the lab results as soon as they are available. The fastest way to get your results is to activate your My Chart account. Instructions are located on the last page of this paperwork. If you have not heard from [place] regarding the results in 2 weeks, please contact this office.     Calcific Tendinitis Calcific tendinitis occurs when crystals of calcium are deposited in a tendon. Tendons are tough, cord-like tissues that connect muscle to bone. Tendons are an important part of joints. They make joints move, and they absorb some of the stress that a joint receives during use. When calcium is deposited in the tendon, the tendon becomes stiff and painful and it can become swollen. Calcific tendinitis occurs frequently in a tendon in the shoulder joint (rotator cuff). What are the causes? The cause of calcific tendinitis is not known. It may be associated with:  Overusing a tendon, such as from repetitive motion.  Excess stress on the tendon.  Age-related wear and tear.  Repetitive, mild injuries. What increases the risk? This condition is more likely to develop in:  People who do activities that involve repetitive motions.  Older people. What are the signs or symptoms? This condition may or may not be painful. If there is pain, it may occur when moving the joint. Other symptoms may include:  Tenderness when pressure is applied to the tendon.  A snapping or popping sound when the joint moves.  Decreased  motion of the joint.  Difficulty sleeping due to pain in the joint. How is this diagnosed? This condition is diagnosed with a physical exam. You may also have tests, such as:  X-rays.  MRI.  CT scan. How is this treated? This condition generally gets better on its own. Treatment may also include:  Resting, icing, applying pressure (compression), and raising (elevating) the area above the level of your heart. This is known as RICE therapy.  Medicines to help reduce inflammation or to help reduce pain.  Physical therapy to strengthen and stretch the tendon.  Following a specific exercise program to keep the joint working properly. Treatment for more severe calcific tendinitis may require:  Injecting steroids or pain-relieving medicines into or around the joint.  Manipulating the joint after you are given medicine to numb the area (local anesthetic).  Inflating the joint with sterile fluid to increase the flexibility of the tendons.  Shock wave therapy, which involves focusing sound waves on the joint. If other treatments do not work, surgery may be done to clean out the calcium deposits and repair the tendon as needed. Most people do not need surgery. Follow these instructions at home: Managing pain, stiffness, and swelling  If directed, put heat on the affected area before you exercise or as often as told by your health care provider. Use the heat source that your health care provider recommends, such as a moist heat pack or a heating pad.  Place a towel between your skin and the heat source.  Leave the heat on for 20-30 minutes.  Remove the heat if your skin turns bright red. This is especially important if you are unable to feel pain, heat, or cold. You may have a greater risk of getting burned.  Move the fingers or toes of the affected limb often, if this applies. This can help to prevent stiffness and lessen swelling.  If directed, elevate the affected area above the  level of your heart while you are sitting or lying down.  If directed, put ice on the affected area:  Put ice in a plastic bag.  Place a towel between your skin and the bag.  Leave the ice on for 20 minutes, 2-3 times a day. General instructions  Take over-the-counter and prescription medicines only as told by your health care provider.  Do not drive or use heavy machinery while taking prescription pain medicine.  Follow recommendations from your health care provider for activity and exercise. Ask your health care provider what activities are safe for you.  Avoid using the affected area while you are experiencing symptoms of tendinitis.  Wear an elastic bandage or compression wrap only as told by your health care provider.  Keep all follow-up visits as told by your health care provider. This is important. Contact a health care provider if:  You have pain or numbness that gets worse.  You develop new weakness.  You notice increased joint stiffness or a sensation of looseness in the joint.  You notice increasing redness, swelling, or warmth around the joint area. Get help right away if:  You have a fever for more than 2-3 days.  You have symptoms for more than 2-3 days.  You have a fever and your symptoms suddenly get worse. This information is not intended to replace advice given to you by your health care provider. Make sure you discuss any questions you have with your health care provider. Document Released: 09/22/2008 Document Revised: 09/07/2016 Document Reviewed: 09/07/2016 Elsevier Interactive Patient Education  2017 [employer].

## 2017-01-25 ENCOUNTER — Other Ambulatory Visit: Payer: Self-pay | Admitting: Emergency Medicine

## 2017-02-08 ENCOUNTER — Ambulatory Visit (INDEPENDENT_AMBULATORY_CARE_PROVIDER_SITE_OTHER): Payer: BLUE CROSS/BLUE SHIELD

## 2017-02-08 ENCOUNTER — Ambulatory Visit (INDEPENDENT_AMBULATORY_CARE_PROVIDER_SITE_OTHER): Payer: BLUE CROSS/BLUE SHIELD | Admitting: Emergency Medicine

## 2017-02-08 VITALS — BP 132/80 | HR 104 | Temp 98.4°F | Resp 16 | Wt 159.0 lb

## 2017-02-08 DIAGNOSIS — M79622 Pain in left upper arm: Secondary | ICD-10-CM | POA: Diagnosis not present

## 2017-02-08 DIAGNOSIS — M19012 Primary osteoarthritis, left shoulder: Secondary | ICD-10-CM

## 2017-02-08 DIAGNOSIS — M25512 Pain in left shoulder: Secondary | ICD-10-CM | POA: Diagnosis not present

## 2017-02-08 DIAGNOSIS — M7552 Bursitis of left shoulder: Secondary | ICD-10-CM

## 2017-02-08 MED ORDER — TRIAMCINOLONE ACETONIDE 40 MG/ML IJ SUSP
40.0000 mg | Freq: Once | INTRAMUSCULAR | Status: AC
Start: 2017-02-08 — End: 2017-02-08
  Administered 2017-02-08: 40 mg via INTRAMUSCULAR

## 2017-02-08 MED ORDER — PREDNISONE 20 MG PO TABS: 40.0000 mg | ORAL_TABLET | Freq: Every day | ORAL | 0 refills | Status: DC

## 2017-02-08 NOTE — Progress Notes (Signed)
Bradley Hansen 67 y.o.   Chief Complaint  Patient presents with  . Back Pain    left are an back pain follow up    HISTORY OF PRESENT ILLNESS: This is a 66 y.o. male complaining of pain to left shoulder x several weeks; denies trauma.  Shoulder Pain   The pain is present in the left shoulder. This is a chronic problem. The current episode started 1 to 4 weeks ago. There has been no history of extremity trauma. The problem occurs constantly. The problem has been waxing and waning. The quality of the pain is described as aching. The pain is at a severity of 5/10. The pain is moderate. Associated symptoms include a limited range of motion and stiffness. Pertinent negatives include no fever, joint swelling, numbness or tingling. The symptoms are aggravated by activity. Family history does not include gout or rheumatoid arthritis. His past medical history is significant for osteoarthritis. There is no history of diabetes or rheumatoid arthritis.     Prior to Admission medications   Medication Sig Start Date End Date Taking? Authorizing Provider  aspirin EC 81 MG tablet Take 1 tablet (81 mg total) by mouth daily. 09/09/15  Yes Rollene Rotunda, MD  predniSONE (DELTASONE) 20 MG tablet Take 2 tablets (40 mg total) by mouth daily with breakfast. 02/08/17 02/13/17  Georgina Quint, MD    No Known Allergies  Patient Active Problem List   Diagnosis Date Noted  . S/P MVR (mitral valve repair) 07/02/2014  . Atrial flutter, post operative 06/04/2014  . Encounter for therapeutic drug monitoring 05/28/2014  . S/P minimally invasive mitral valve repair 04/26/2014  . Mitral valve prolapse 03/26/2014  . Mitral regurgitation   . Annual physical exam 02/01/2012  . ATHEROSCLEROSIS OF AORTA 12/01/2010  . SHOULDER PAIN, RIGHT 12/01/2010  . BACK PAIN, CHRONIC 12/11/2009  . HEPATITIS C 02/24/2007  . MITRAL VALVE PROLAPSE 02/24/2007  . GASTROESOPHAGEAL REFLUX, NO ESOPHAGITIS 02/24/2007    Past Medical  History:  Diagnosis Date  . Atrial flutter, post operative 06/04/2014  . BACK PAIN, CHRONIC 12/11/2009   Qualifier: Diagnosis of  By: Mauricio Po MD, Fayrene Fearing    . Fibromyalgia   . GASTROESOPHAGEAL REFLUX, NO ESOPHAGITIS 02/24/2007   Qualifier: Diagnosis of  By: Abundio Miu    . HEPATITIS C 02/24/2007   Qualifier: Diagnosis of  By: Abundio Miu    . Hx of echocardiogram    Echo (04/2014):  EF 45-50%, diff HK, mild AI, MV repair ok (mean 5 mmHg), mod LAE, mild RAE, mild late TV systolic prolapse  . Hypercholesteremia   . Leukopenia   . Mitral regurgitation   . Mitral valve prolapse    TEE  08/2010 severe MR, bileaflet MVP, EF 60%  . Prostadynia   . Prostatitis   . Rhinitis 2011   chronic   . S/P minimally invasive mitral valve repair 04/26/2014   Complex valvuloplasty including artificial Gore-tex neocord placement x8, bovine pericardial patch augmentation of P3 portion of posterior leaflet and 36 mm Sorin Memo 3D ring annuloplasty via right mini thoracotomy  . Synovitis of hand 2011   right hand    Past Surgical History:  Procedure Laterality Date  . APPENDECTOMY    . CARDIOVERSION N/A 06/01/2014   Procedure: CARDIOVERSION;  Surgeon: [name], MD;  Location: [facility] ENDOSCOPY;  Service: Cardiovascular;  Laterality: N/A;  . INTRAOPERATIVE TRANSESOPHAGEAL ECHOCARDIOGRAM N/A 04/26/2014   Procedure: INTRAOPERATIVE TRANSESOPHAGEAL ECHOCARDIOGRAM;  Surgeon: Purcell Nails, MD;  Location: [facility] OR;  Service: Open Heart Surgery;  Laterality: N/A;  . LEFT AND RIGHT HEART CATHETERIZATION WITH CORONARY ANGIOGRAM N/A 02/19/2014   Procedure: LEFT AND RIGHT HEART CATHETERIZATION WITH CORONARY ANGIOGRAM;  Surgeon: Corky Crafts, MD;  Location: [facility] CATH LAB;  Service: Cardiovascular;  Laterality: N/A;  . MITRAL VALVE REPAIR Right 04/26/2014   Procedure: MINIMALLY INVASIVE MITRAL VALVE REPAIR (MVR);  Surgeon: Purcell Nails, MD;  Location: [facility] OR;  Service: Open Heart Surgery;  Laterality: Right;  .  TEE WITHOUT CARDIOVERSION N/A 03/20/2013   Procedure: TRANSESOPHAGEAL ECHOCARDIOGRAM (TEE);  Surgeon: [name], MD;  Location: [facility] ENDOSCOPY;  Service: Cardiovascular;  Laterality: N/A;    Social History   Social History  . Marital status: Married    Spouse name: N/A  . Number of children: N/A  . Years of education: N/A   Occupational History  . Not on file.   Social History Main Topics  . Smoking status: Never Smoker  . Smokeless tobacco: Never Used  . Alcohol use Yes     Comment: week  . Drug use: No  . Sexual activity: Not on file   Other Topics Concern  . Not on file   Social History Narrative  . No narrative on file    Family History  Problem Relation Age of Onset  . Heart attack Neg Hx   . Cancer Neg Hx   . Stroke Neg Hx      Review of Systems  Constitutional: Negative for chills, fever and malaise/fatigue.  HENT: Negative.   Eyes: Negative.   Respiratory: Negative for cough, shortness of breath and wheezing.   Cardiovascular: Negative for chest pain, palpitations and leg swelling.  Gastrointestinal: Negative for abdominal pain, diarrhea, nausea and vomiting.  Genitourinary: Negative for dysuria and hematuria.  Musculoskeletal: Positive for joint pain (left shoulder/arm) and stiffness. Negative for myalgias.  Skin: Negative for rash.  Neurological: Negative for dizziness, tingling, sensory change, focal weakness, numbness and headaches.  Endo/Heme/Allergies: Does not bruise/bleed easily.  All other systems reviewed and are negative.  Vitals:   02/08/17 1155  BP: 132/80  Pulse: (!) 104  Resp: 16  Temp: 98.4 F (36.9 C)     Physical Exam  Constitutional: He is oriented to person, place, and time. He appears well-developed and well-nourished.  HENT:  Head: Normocephalic and atraumatic.  Nose: Nose normal.  Mouth/Throat: Oropharynx is clear and moist.  Eyes: Conjunctivae and EOM are normal. Pupils are equal, round, and reactive to light.    Neck: Normal range of motion. Neck supple.  Cardiovascular: Normal rate and regular rhythm.   Repeat HR at bedside: 86  Pulmonary/Chest: Effort normal and breath sounds normal.  Abdominal: Soft. There is no tenderness.  Musculoskeletal:  Left shoulder: LROM with mild tenderness to palpation LUE: NVI  Neurological: He is alert and oriented to person, place, and time. No sensory deficit. He exhibits normal muscle tone.  Skin: Skin is warm and dry. Capillary refill takes less than 2 seconds.  Psychiatric: He has a normal mood and affect. His behavior is normal.  Vitals reviewed.  Xray reviewed  ASSESSMENT & PLAN: Kallan was seen today for back pain.  Diagnoses and all orders for this visit:  Left upper arm pain -     DG Shoulder Left; Future  Acute pain of left shoulder -     DG Shoulder Left; Future -     Ambulatory referral to Orthopedic Surgery -     triamcinolone acetonide (KENALOG-40) injection 40 mg;  Inject 1 mL (40 mg total) into the muscle once.  Acute shoulder bursitis, left  Arthritis of left shoulder region  Other orders -     predniSONE (DELTASONE) 20 MG tablet; Take 2 tablets (40 mg total) by mouth daily with breakfast.    Patient Instructions       IF you received an x-ray today, you will receive an invoice from [facility]. Please contact [facility] at [phone] with questions or concerns regarding your invoice.   IF you received labwork today, you will receive an invoice from [place]. Please contact [facility] at [phone] with questions or concerns regarding your invoice.   Our billing staff will not be able to assist you with questions regarding bills from these companies.  You will be contacted with the lab results as soon as they are available. The fastest way to get your results is to activate your My Chart account. Instructions are located on the last page of this paperwork. If you have not heard from [place] regarding the  results in 2 weeks, please contact this office.      Shoulder Pain Many things can cause shoulder pain, including:  An injury.  Moving the arm in the same way again and again (overuse).  Joint pain (arthritis). Follow these instructions at home: Take these actions to help with your pain:  Squeeze a soft ball or a foam pad as much as you can. This helps to prevent swelling. It also makes the arm stronger.  Take over-the-counter and prescription medicines only as told by your doctor.  If told, put ice on the area:  Put ice in a plastic bag.  Place a towel between your skin and the bag.  Leave the ice on for 20 minutes, 2-3 times per day. Stop putting on ice if it does not help with the pain.  If you were given a shoulder sling or immobilizer:  Wear it as told.  Remove it to shower or bathe.  Move your arm as little as possible.  Keep your hand moving. This helps prevent swelling. Contact a doctor if:  Your pain gets worse.  Medicine does not help your pain.  You have new pain in your arm, hand, or fingers. Get help right away if:  Your arm, hand, or fingers:  Tingle.  Are numb.  Are swollen.  Are painful.  Turn white or blue. This information is not intended to replace advice given to you by your health care provider. Make sure you discuss any questions you have with your health care provider. Document Released: 06/01/2008 Document Revised: 08/09/2016 Document Reviewed: 04/08/2015 Elsevier Interactive Patient Education  2017 Elsevier Inc.      Edwina Barth, MD [facility]

## 2017-02-08 NOTE — Patient Instructions (Addendum)
     IF you received an x-ray today, you will receive an invoice from [facility]. Please contact  Radiology at [phone] with questions or concerns regarding your invoice.   IF you received labwork today, you will receive an invoice from [facility]. Please contact [facility] at [phone] with questions or concerns regarding your invoice.   Our billing staff will not be able to assist you with questions regarding bills from these companies.  You will be contacted with the lab results as soon as they are available. The fastest way to get your results is to activate your My Chart account. Instructions are located on the last page of this paperwork. If you have not heard from us regarding the results in 2 weeks, please contact this office.      Shoulder Pain Many things can cause shoulder pain, including:  An injury.  Moving the arm in the same way again and again (overuse).  Joint pain (arthritis). Follow these instructions at home: Take these actions to help with your pain:  Squeeze a soft ball or a foam pad as much as you can. This helps to prevent swelling. It also makes the arm stronger.  Take over-the-counter and prescription medicines only as told by your doctor.  If told, put ice on the area:  Put ice in a plastic bag.  Place a towel between your skin and the bag.  Leave the ice on for 20 minutes, 2-3 times per day. Stop putting on ice if it does not help with the pain.  If you were given a shoulder sling or immobilizer:  Wear it as told.  Remove it to shower or bathe.  Move your arm as little as possible.  Keep your hand moving. This helps prevent swelling. Contact a doctor if:  Your pain gets worse.  Medicine does not help your pain.  You have new pain in your arm, hand, or fingers. Get help right away if:  Your arm, hand, or fingers:  Tingle.  Are numb.  Are swollen.  Are painful.  Turn white or blue. This information is  not intended to replace advice given to you by your health care provider. Make sure you discuss any questions you have with your health care provider. Document Released: 06/01/2008 Document Revised: 08/09/2016 Document Reviewed: 04/08/2015 Elsevier Interactive Patient Education  2017 Elsevier Inc.  

## 2017-02-15 ENCOUNTER — Other Ambulatory Visit: Payer: Self-pay | Admitting: Emergency Medicine

## 2017-02-15 DIAGNOSIS — H20011 Primary iridocyclitis, right eye: Secondary | ICD-10-CM | POA: Diagnosis not present

## 2017-02-17 NOTE — Telephone Encounter (Signed)
OK to refill

## 2017-02-18 ENCOUNTER — Ambulatory Visit (INDEPENDENT_AMBULATORY_CARE_PROVIDER_SITE_OTHER): Payer: BLUE CROSS/BLUE SHIELD | Admitting: Orthopedic Surgery

## 2017-02-22 DIAGNOSIS — H2000 Unspecified acute and subacute iridocyclitis: Secondary | ICD-10-CM | POA: Diagnosis not present

## 2017-03-01 ENCOUNTER — Other Ambulatory Visit: Payer: Self-pay | Admitting: Physician Assistant

## 2017-04-05 DIAGNOSIS — E559 Vitamin D deficiency, unspecified: Secondary | ICD-10-CM | POA: Diagnosis not present

## 2017-04-05 DIAGNOSIS — N401 Enlarged prostate with lower urinary tract symptoms: Secondary | ICD-10-CM | POA: Diagnosis not present

## 2017-04-12 DIAGNOSIS — N401 Enlarged prostate with lower urinary tract symptoms: Secondary | ICD-10-CM | POA: Diagnosis not present

## 2017-04-12 DIAGNOSIS — R350 Frequency of micturition: Secondary | ICD-10-CM | POA: Diagnosis not present

## 2017-04-12 DIAGNOSIS — E559 Vitamin D deficiency, unspecified: Secondary | ICD-10-CM | POA: Diagnosis not present

## 2017-06-07 DIAGNOSIS — M25512 Pain in left shoulder: Secondary | ICD-10-CM | POA: Diagnosis not present

## 2017-06-07 DIAGNOSIS — G8929 Other chronic pain: Secondary | ICD-10-CM | POA: Diagnosis not present

## 2017-06-07 DIAGNOSIS — M7502 Adhesive capsulitis of left shoulder: Secondary | ICD-10-CM | POA: Diagnosis not present

## 2017-09-27 DIAGNOSIS — E559 Vitamin D deficiency, unspecified: Secondary | ICD-10-CM | POA: Diagnosis not present

## 2017-10-04 DIAGNOSIS — E559 Vitamin D deficiency, unspecified: Secondary | ICD-10-CM | POA: Diagnosis not present

## 2017-10-04 DIAGNOSIS — R351 Nocturia: Secondary | ICD-10-CM | POA: Diagnosis not present

## 2017-10-04 DIAGNOSIS — N401 Enlarged prostate with lower urinary tract symptoms: Secondary | ICD-10-CM | POA: Diagnosis not present

## 2018-01-31 ENCOUNTER — Ambulatory Visit: Payer: BLUE CROSS/BLUE SHIELD | Admitting: Cardiology

## 2018-02-06 NOTE — Progress Notes (Signed)
HPI The patient presents for followup of minimally invasive mitral valve repair by Dr. Cornelius Moras. He had postoperative atrial fibrillation and DCCV.  He was on amiodarone. This was discontinued secondary to bradycardia. However, at followup with Dr. Cornelius Moras he was noted to be back in atrial flutter. He was again placed on amiodarone.  He converted on this back into sinus.  However follow up EKG demonstrated a markedly abnormal EKG with QT prolongation and T wave inversion.  I stopped the amiodarone.  Follow up EKG demonstrated resolution of these changes.   He has a mildly reduced EF but stable repair on echo in Apirl 2017.  MV repair was stable as well.   He returns for follow up.    Since I last saw him he has done well.  The patient denies any new symptoms such as chest discomfort, neck or arm discomfort. There has been no new shortness of breath, PND or orthopnea. There have been no reported palpitations, presyncope or syncope.     No Known Allergies  Current Outpatient Medications  Medication Sig Dispense Refill  . aspirin EC 81 MG tablet Take 1 tablet (81 mg total) by mouth daily. 90 tablet 3   No current facility-administered medications for this visit.     Past Medical History:  Diagnosis Date  . Atrial flutter, post operative 06/04/2014  . BACK PAIN, CHRONIC 12/11/2009   Qualifier: Diagnosis of  By: Mauricio Po MD, Fayrene Fearing    . Fibromyalgia   . GASTROESOPHAGEAL REFLUX, NO ESOPHAGITIS 02/24/2007   Qualifier: Diagnosis of  By: Abundio Miu    . HEPATITIS C 02/24/2007   Qualifier: Diagnosis of  By: Abundio Miu    . Hx of echocardiogram    Echo (04/2014):  EF 45-50%, diff HK, mild AI, MV repair ok (mean 5 mmHg), mod LAE, mild RAE, mild late TV systolic prolapse  . Hypercholesteremia   . Leukopenia   . Mitral regurgitation   . Mitral valve prolapse    TEE  08/2010 severe MR, bileaflet MVP, EF 60%  . Prostadynia   . Prostatitis   . Rhinitis 2011   chronic   . S/P minimally invasive  mitral valve repair 04/26/2014   Complex valvuloplasty including artificial Gore-tex neocord placement x8, bovine pericardial patch augmentation of P3 portion of posterior leaflet and 36 mm Sorin Memo 3D ring annuloplasty via right mini thoracotomy  . Synovitis of hand 2011   right hand    Past Surgical History:  Procedure Laterality Date  . APPENDECTOMY    . CARDIOVERSION N/A 06/01/2014   Procedure: CARDIOVERSION;  Surgeon: [name], MD;  Location: [facility] ENDOSCOPY;  Service: Cardiovascular;  Laterality: N/A;  . INTRAOPERATIVE TRANSESOPHAGEAL ECHOCARDIOGRAM N/A 04/26/2014   Procedure: INTRAOPERATIVE TRANSESOPHAGEAL ECHOCARDIOGRAM;  Surgeon: Purcell Nails, MD;  Location[facility] OR;  Service: Open Heart Surgery;  Laterality: N/A;  . LEFT AND RIGHT HEART CATHETERIZATION WITH CORONARY ANGIOGRAM N/A 02/19/2014   Procedure: LEFT AND RIGHT HEART CATHETERIZATION WITH CORONARY ANGIOGRAM;  Surgeon: Corky Crafts, MD;  Location: [facility] CATH LAB;  Service: Cardiovascular;  Laterality: N/A;  . MITRAL VALVE REPAIR Right 04/26/2014   Procedure: MINIMALLY INVASIVE MITRAL VALVE REPAIR (MVR);  Surgeon: Purcell Nails, MD;  Location: [facility] OR;  Service: Open Heart Surgery;  Laterality: Right;  . TEE WITHOUT CARDIOVERSION N/A 03/20/2013   Procedure: TRANSESOPHAGEAL ECHOCARDIOGRAM (TEE);  Surgeon: [name], MD;  Location: [facility] ENDOSCOPY;  Service: Cardiovascular;  Laterality: N/A;    ROS:  As stated in the HPI and negative for all other systems.  PHYSICAL EXAM BP 106/66   Pulse 86   Ht 5\' 7"  (1.702 m)   Wt 160 lb (72.6 kg)   BMI 25.06 kg/m   GENERAL:  Well appearing NECK:  No jugular venous distention, waveform within normal limits, carotid upstroke brisk and symmetric, no bruits, no thyromegaly LUNGS:  Clear to auscultation bilaterally CHEST:  Well healed surgical scar.  HEART:  PMI not displaced or sustained,S1 and S2 within normal limits, no S3, no S4, no clicks, no rubs, no murmurs ABD:   Flat, positive bowel sounds normal in frequency in pitch, no bruits, no rebound, no guarding, no midline pulsatile mass, no hepatomegaly, no splenomegaly EXT:  2 plus pulses throughout, no edema, no cyanosis no clubbing     EKG:  Sinus rhythm, rate 86,  No acute ST T wave changes.   02/07/2018   ASSESSMENT AND PLAN  ATRIAL FLUTTER:    He has been in sinus.   No change in therapy.    MITRAL VALVE REPAIR:   I will repeat an echo.  Further testing will be based on this.    MILDLY REDUCED EF:   I will assess this as above.

## 2018-02-07 ENCOUNTER — Encounter: Payer: Self-pay | Admitting: Cardiology

## 2018-02-07 ENCOUNTER — Ambulatory Visit (INDEPENDENT_AMBULATORY_CARE_PROVIDER_SITE_OTHER): Payer: BLUE CROSS/BLUE SHIELD | Admitting: Cardiology

## 2018-02-07 VITALS — BP 106/66 | HR 86 | Ht 67.0 in | Wt 160.0 lb

## 2018-02-07 DIAGNOSIS — Z9889 Other specified postprocedural states: Secondary | ICD-10-CM

## 2018-02-07 DIAGNOSIS — I483 Typical atrial flutter: Secondary | ICD-10-CM | POA: Diagnosis not present

## 2018-02-07 NOTE — Patient Instructions (Signed)

## 2018-02-21 ENCOUNTER — Other Ambulatory Visit (HOSPITAL_COMMUNITY): Payer: BLUE CROSS/BLUE SHIELD

## 2018-02-28 ENCOUNTER — Ambulatory Visit: Payer: BLUE CROSS/BLUE SHIELD | Admitting: Cardiology

## 2018-03-07 ENCOUNTER — Ambulatory Visit (HOSPITAL_COMMUNITY): Payer: BLUE CROSS/BLUE SHIELD | Attending: Cardiovascular Disease

## 2018-03-07 ENCOUNTER — Other Ambulatory Visit: Payer: Self-pay

## 2018-03-07 DIAGNOSIS — Z9889 Other specified postprocedural states: Secondary | ICD-10-CM | POA: Insufficient documentation

## 2018-03-30 ENCOUNTER — Encounter: Payer: Self-pay | Admitting: Physician Assistant

## 2018-06-06 DIAGNOSIS — N401 Enlarged prostate with lower urinary tract symptoms: Secondary | ICD-10-CM | POA: Diagnosis not present

## 2018-06-13 DIAGNOSIS — N401 Enlarged prostate with lower urinary tract symptoms: Secondary | ICD-10-CM | POA: Diagnosis not present

## 2018-06-13 DIAGNOSIS — R350 Frequency of micturition: Secondary | ICD-10-CM | POA: Diagnosis not present

## 2018-10-05 DIAGNOSIS — M5432 Sciatica, left side: Secondary | ICD-10-CM | POA: Diagnosis not present

## 2018-11-25 DIAGNOSIS — H1032 Unspecified acute conjunctivitis, left eye: Secondary | ICD-10-CM | POA: Diagnosis not present

## 2019-03-07 DIAGNOSIS — N401 Enlarged prostate with lower urinary tract symptoms: Secondary | ICD-10-CM | POA: Diagnosis not present

## 2019-03-07 DIAGNOSIS — R351 Nocturia: Secondary | ICD-10-CM | POA: Diagnosis not present

## 2019-03-23 ENCOUNTER — Encounter: Payer: Self-pay | Admitting: Cardiovascular Disease

## 2019-03-23 NOTE — Progress Notes (Signed)
I called [name] to discuss changing the format of his appointment or rescheduling due to the COVID19 outbreak.  He has no complaints and feels well. No symptoms of viral infection, no respiratory complaints. He does not have a computer with audio or video capability, but does have an Insurance risk surveyor. He does not have a mychart account. He seemed ambivalent about a telehealth visit, although he did not completely exclude that option. He is agreeable to rescheduling his appointment in 3 months.

## 2019-03-27 ENCOUNTER — Ambulatory Visit: Payer: BLUE CROSS/BLUE SHIELD | Admitting: Cardiology

## 2019-04-28 ENCOUNTER — Telehealth: Payer: Self-pay

## 2019-04-28 NOTE — Telephone Encounter (Signed)
LEFT MESSAGE FOR PT TO RETURN CALL TO OFFICE TO MAKE APPT VIRTUAL

## 2019-04-30 NOTE — Progress Notes (Deleted)
{Choose 1 Note Type (Telehealth Visit or Telephone Visit):[phone]}   Date:  04/30/2019   ID:  EDU CRESTO, DOB Sep 17, 1951, MRN [id number]  {Patient Location:[phone]::"Home"} {Provider Location:[phone]::"Home"}  PCP:  Garnetta Buddy, PA  Cardiologist:  No primary care provider on file. *** Electrophysiologist:  None   Evaluation Performed:  {Choose Visit Type:[phone]::"Follow-Up Visit"}  Chief Complaint:  ***  History of Present Illness:    Bradley Hansen is a 68 y.o. male with who presents for followup of minimally invasive mitral valve repair by Dr. Cornelius Moras. He had postoperative atrial fibrillation and DCCV.  He was on amiodarone. This was discontinued secondary to bradycardia. However, at followup with Dr. Cornelius Moras he was noted to be back in atrial flutter. He was again placed on amiodarone.  He converted on this back into sinus.  However follow up EKG demonstrated a markedly abnormal EKG with QT prolongation and T wave inversion.  I stopped the amiodarone.  Follow up EKG demonstrated resolution of these changes.   He has a mildly reduced EF but stable repair on echo in Apirl 2017.  MV repair was stable as well.  ***      He returns for follow up.    Since I last saw him he has done well.  The patient denies any new symptoms such as chest discomfort, neck or arm discomfort. There has been no new shortness of breath, PND or orthopnea. There have been no reported palpitations, presyncope or syncope.     The patient {does/does not:200015} have symptoms concerning for COVID-19 infection (fever, chills, cough, or new shortness of breath).    Past Medical History:  Diagnosis Date  . Atrial flutter, post operative 06/04/2014  . BACK PAIN, CHRONIC 12/11/2009   Qualifier: Diagnosis of  By: Mauricio Po MD, Fayrene Fearing    . Fibromyalgia   . GASTROESOPHAGEAL REFLUX, NO ESOPHAGITIS 02/24/2007   Qualifier: Diagnosis of  By: Abundio Miu    . HEPATITIS C 02/24/2007   Qualifier: Diagnosis of   By: Abundio Miu    . Hx of echocardiogram    Echo (04/2014):  EF 45-50%, diff HK, mild AI, MV repair ok (mean 5 mmHg), mod LAE, mild RAE, mild late TV systolic prolapse  . Hypercholesteremia   . Leukopenia   . Mitral regurgitation   . Mitral valve prolapse    TEE  08/2010 severe MR, bileaflet MVP, EF 60%  . Prostadynia   . Prostatitis   . Rhinitis 2011   chronic   . S/P minimally invasive mitral valve repair 04/26/2014   Complex valvuloplasty including artificial Gore-tex neocord placement x8, bovine pericardial patch augmentation of P3 portion of posterior leaflet and 36 mm Sorin Memo 3D ring annuloplasty via right mini thoracotomy  . Synovitis of hand 2011   right hand   Past Surgical History:  Procedure Laterality Date  . APPENDECTOMY    . CARDIOVERSION N/A 06/01/2014   Procedure: CARDIOVERSION;  Surgeon: [name], MD;  Location: [facility] ENDOSCOPY;  Service: Cardiovascular;  Laterality: N/A;  . INTRAOPERATIVE TRANSESOPHAGEAL ECHOCARDIOGRAM N/A 04/26/2014   Procedure: INTRAOPERATIVE TRANSESOPHAGEAL ECHOCARDIOGRAM;  Surgeon: Purcell Nails, MD;  Location: [facility] OR;  Service: Open Heart Surgery;  Laterality: N/A;  . LEFT AND RIGHT HEART CATHETERIZATION WITH CORONARY ANGIOGRAM N/A 02/19/2014   Procedure: LEFT AND RIGHT HEART CATHETERIZATION WITH CORONARY ANGIOGRAM;  Surgeon: Corky Crafts, MD;  Location: [facility] CATH LAB;  Service: Cardiovascular;  Laterality: N/A;  . MITRAL VALVE REPAIR Right 04/26/2014   Procedure: MINIMALLY INVASIVE  MITRAL VALVE REPAIR (MVR);  Surgeon: Purcell Nails, MD;  Location: [facility] OR;  Service: Open Heart Surgery;  Laterality: Right;  . TEE WITHOUT CARDIOVERSION N/A 03/20/2013   Procedure: TRANSESOPHAGEAL ECHOCARDIOGRAM (TEE);  Surgeon: [name], MD;  Location: [facility] ENDOSCOPY;  Service: Cardiovascular;  Laterality: N/A;     No outpatient medications have been marked as taking for the 05/01/19 encounter (Appointment) with Rollene Rotunda, MD.      Allergies:   Patient has no known allergies.   Social History   Tobacco Use  . Smoking status: Never Smoker  . Smokeless tobacco: Never Used  Substance Use Topics  . Alcohol use: Yes    Comment: week  . Drug use: No     Family Hx: The patient's family history is negative for Heart attack, Cancer, and Stroke.  ROS:   Please see the history of present illness.    *** All other systems reviewed and are negative.   Prior CV studies:   The following studies were reviewed today:  ***  Labs/Other Tests and Data Reviewed:    EKG:  {EKG/Telemetry Strips Reviewed:[phone]}  Recent Labs: No results found for requested labs within last 8760 hours.   Recent Lipid Panel Lab Results  Component Value Date/Time   CHOL 179 02/01/2012 10:01 AM   TRIG 58 02/01/2012 10:01 AM   HDL 58 02/01/2012 10:01 AM   CHOLHDL 3.1 02/01/2012 10:01 AM   LDLCALC 109 (H) 02/01/2012 10:01 AM    Wt Readings from Last 3 Encounters:  02/07/18 160 lb (72.6 kg)  02/08/17 159 lb (72.1 kg)  01/04/17 160 lb (72.6 kg)     Objective:    Vital Signs:  There were no vitals taken for this visit.   {[facility] Virtual Exam (Optional):[phone]::"VITAL SIGNS:  reviewed"}  ASSESSMENT & PLAN:    ATRIAL FLUTTER:   ***   He has been in sinus.   No change in therapy.    MITRAL VALVE REPAIR:   ***   I will repeat an echo.  Further testing will be based on this.    MILDLY REDUCED EF:  ***   I will assess this as above.   COVID-19 Education: The signs and symptoms of COVID-19 were discussed with the patient and how to seek care for testing (follow up with PCP or arrange E-visit).  ***The importance of social distancing was discussed today.  Time:   Today, I have spent *** minutes with the patient with telehealth technology discussing the above problems.     Medication Adjustments/Labs and Tests Ordered: Current medicines are reviewed at length with the patient today.  Concerns regarding medicines  are outlined above.   Tests Ordered: No orders of the defined types were placed in this encounter.   Medication Changes: No orders of the defined types were placed in this encounter.   Disposition:  Follow up {follow up:15908}  Signed, Rollene Rotunda, MD  04/30/2019 6:34 PM    [facility]

## 2019-05-01 ENCOUNTER — Ambulatory Visit: Payer: BLUE CROSS/BLUE SHIELD | Admitting: Cardiology

## 2019-05-16 DIAGNOSIS — B182 Chronic viral hepatitis C: Secondary | ICD-10-CM | POA: Diagnosis not present

## 2019-05-16 DIAGNOSIS — Z136 Encounter for screening for cardiovascular disorders: Secondary | ICD-10-CM | POA: Diagnosis not present

## 2019-05-16 DIAGNOSIS — Z6826 Body mass index (BMI) 26.0-26.9, adult: Secondary | ICD-10-CM | POA: Diagnosis not present

## 2019-05-16 DIAGNOSIS — D649 Anemia, unspecified: Secondary | ICD-10-CM | POA: Diagnosis not present

## 2019-05-16 DIAGNOSIS — Z1329 Encounter for screening for other suspected endocrine disorder: Secondary | ICD-10-CM | POA: Diagnosis not present

## 2019-05-16 DIAGNOSIS — Z13220 Encounter for screening for lipoid disorders: Secondary | ICD-10-CM | POA: Diagnosis not present

## 2019-05-16 DIAGNOSIS — Z0000 Encounter for general adult medical examination without abnormal findings: Secondary | ICD-10-CM | POA: Diagnosis not present

## 2019-05-16 DIAGNOSIS — I4892 Unspecified atrial flutter: Secondary | ICD-10-CM | POA: Diagnosis not present

## 2019-05-18 DIAGNOSIS — B182 Chronic viral hepatitis C: Secondary | ICD-10-CM | POA: Diagnosis not present

## 2019-05-18 DIAGNOSIS — D649 Anemia, unspecified: Secondary | ICD-10-CM | POA: Diagnosis not present

## 2019-06-28 DIAGNOSIS — D649 Anemia, unspecified: Secondary | ICD-10-CM | POA: Diagnosis not present

## 2019-06-29 DIAGNOSIS — L259 Unspecified contact dermatitis, unspecified cause: Secondary | ICD-10-CM | POA: Diagnosis not present

## 2019-06-29 DIAGNOSIS — R768 Other specified abnormal immunological findings in serum: Secondary | ICD-10-CM | POA: Diagnosis not present

## 2019-06-29 DIAGNOSIS — D509 Iron deficiency anemia, unspecified: Secondary | ICD-10-CM | POA: Diagnosis not present

## 2019-06-29 DIAGNOSIS — Z1159 Encounter for screening for other viral diseases: Secondary | ICD-10-CM | POA: Diagnosis not present

## 2019-07-31 DIAGNOSIS — B1920 Unspecified viral hepatitis C without hepatic coma: Secondary | ICD-10-CM | POA: Diagnosis not present

## 2019-07-31 DIAGNOSIS — D649 Anemia, unspecified: Secondary | ICD-10-CM | POA: Diagnosis not present

## 2019-09-10 NOTE — Progress Notes (Signed)
HPI The patient presents for followup of minimally invasive mitral valve repair by Dr. Roxy Manns. He had postoperative atrial fibrillation and DCCV.  He was on amiodarone. This was discontinued secondary to bradycardia. However, at followup with Dr. Roxy Manns he was noted to be back in atrial flutter. He was again placed on amiodarone.  He converted on this back into sinus.  However follow up EKG demonstrated a markedly abnormal EKG with QT prolongation and T wave inversion.  I stopped the amiodarone.  Follow up EKG demonstrated resolution of these changes.   He has a mildly reduced EF but stable repair on echo in Apirl 2017 and again in 2019.    Since I last saw him he has been well.  He is now exercising back at the gym on the treadmill. The patient denies any new symptoms such as chest discomfort, neck or arm discomfort. There has been no new shortness of breath, PND or orthopnea. There have been no reported palpitations, presyncope or syncope.    No Known Allergies  Current Outpatient Medications  Medication Sig Dispense Refill  . aspirin EC 81 MG tablet Take 1 tablet (81 mg total) by mouth daily. 90 tablet 3   No current facility-administered medications for this visit.     Past Medical History:  Diagnosis Date  . Atrial flutter, post operative 06/04/2014  . BACK PAIN, CHRONIC 12/11/2009   Qualifier: Diagnosis of  By: Lindell Noe MD, Jeneen Rinks    . Fibromyalgia   . GASTROESOPHAGEAL REFLUX, NO ESOPHAGITIS 02/24/2007   Qualifier: Diagnosis of  By: Eusebio Friendly    . HEPATITIS C 02/24/2007   Qualifier: Diagnosis of  By: Eusebio Friendly    . Hx of echocardiogram    Echo (04/2014):  EF 45-50%, diff HK, mild AI, MV repair ok (mean 5 mmHg), mod LAE, mild RAE, mild late TV systolic prolapse  . Hypercholesteremia   . Leukopenia   . Mitral regurgitation   . Mitral valve prolapse    TEE  08/2010 severe MR, bileaflet MVP, EF 60%  . Prostadynia   . Prostatitis   . Rhinitis 2011   chronic   . S/P  minimally invasive mitral valve repair 04/26/2014   Complex valvuloplasty including artificial Gore-tex neocord placement x8, bovine pericardial patch augmentation of P3 portion of posterior leaflet and 36 mm Sorin Memo 3D ring annuloplasty via right mini thoracotomy  . Synovitis of hand 2011   right hand    Past Surgical History:  Procedure Laterality Date  . APPENDECTOMY    . CARDIOVERSION N/A 06/01/2014   Procedure: CARDIOVERSION;  Surgeon: Lelon Perla, MD;  [facility] ENDOSCOPY;  Service: Cardiovascular;  Laterality: N/A;  . INTRAOPERATIVE TRANSESOPHAGEAL ECHOCARDIOGRAM N/A 04/26/2014   Procedure: INTRAOPERATIVE TRANSESOPHAGEAL ECHOCARDIOGRAM;  Surgeon: Rexene Alberts, MD;  Location: [place];  Service: Open Heart Surgery;  Laterality: N/A;  . LEFT AND RIGHT HEART CATHETERIZATION WITH CORONARY ANGIOGRAM N/A 02/19/2014   Procedure: LEFT AND RIGHT HEART CATHETERIZATION WITH CORONARY ANGIOGRAM;  Surgeon: Jettie Booze, MD;  Location: [facility] CATH LAB;  Service: Cardiovascular;  Laterality: N/A;  . MITRAL VALVE REPAIR Right 04/26/2014   Procedure: MINIMALLY INVASIVE MITRAL VALVE REPAIR (MVR);  Surgeon: Rexene Alberts, MD;  Location: [place];  Service: Open Heart Surgery;  Laterality: Right;  . TEE WITHOUT CARDIOVERSION N/A 03/20/2013   Procedure: TRANSESOPHAGEAL ECHOCARDIOGRAM (TEE);  Surgeon: Thayer Headings, MD;  Location: [place];  Service: Cardiovascular;  Laterality: N/A;    ROS:  As stated in the HPI and negative for all other systems.  PHYSICAL EXAM BP 138/86   Pulse 72   Temp (!) 97.1 F (36.2 C)   Ht 5\' 4"  (1.626 m)   Wt 164 lb (74.4 kg)   SpO2 99%   BMI 28.15 kg/m   GENERAL:  Well appearing NECK:  No jugular venous distention, waveform within normal limits, carotid upstroke brisk and symmetric, no bruits, no thyromegaly LUNGS:  Clear to auscultation bilaterally CHEST:  Well healed surgical scar. HEART:  PMI not displaced or sustained,S1 and S2 within normal  limits, no S3, no S4, no clicks, no rubs, no murmurs ABD:  Flat, positive bowel sounds normal in frequency in pitch, no bruits, no rebound, no guarding, no midline pulsatile mass, no hepatomegaly, no splenomegaly EXT:  2 plus pulses throughout, no edema, no cyanosis no clubbing   EKG:  Sinus rhythm, rate 71,  No acute ST T wave changes.   09/11/2019   ASSESSMENT AND PLAN  ATRIAL FLUTTER:    He has had no symptomatic recurrence of this.  No change in therapy.  MITRAL VALVE REPAIR:     He had a stable valve repair last year with a little bit of mitral stenosis.  He said no change in his exam or symptoms.  I will follow this clinically.   MILDLY REDUCED EF: I reviewed this with him.  He said no shortness of breath.  He has no other cardiovascular symptoms.  No change in therapy.

## 2019-09-11 ENCOUNTER — Encounter: Payer: Self-pay | Admitting: Cardiology

## 2019-09-11 ENCOUNTER — Other Ambulatory Visit: Payer: Self-pay

## 2019-09-11 ENCOUNTER — Ambulatory Visit (INDEPENDENT_AMBULATORY_CARE_PROVIDER_SITE_OTHER): Payer: BC Managed Care – PPO | Admitting: Cardiology

## 2019-09-11 VITALS — BP 138/86 | HR 72 | Temp 97.1°F | Ht 64.0 in | Wt 164.0 lb

## 2019-09-11 DIAGNOSIS — Z9889 Other specified postprocedural states: Secondary | ICD-10-CM

## 2019-09-11 DIAGNOSIS — I340 Nonrheumatic mitral (valve) insufficiency: Secondary | ICD-10-CM

## 2019-09-11 DIAGNOSIS — I483 Typical atrial flutter: Secondary | ICD-10-CM | POA: Diagnosis not present

## 2019-09-11 NOTE — Patient Instructions (Signed)
Medication Instructions:  Your physician recommends that you continue on your current medications as directed. Please refer to the Current Medication list given to you today.  If you need a refill on your cardiac medications before your next appointment, please call your pharmacy.   Lab work: NONE  Testing/Procedures: NONE  Follow-Up: At [facility], you and your health needs are our priority.  As part of our continuing mission to provide you with exceptional heart care, we have created designated Provider Care Teams.  These Care Teams include your primary Cardiologist (physician) and Advanced Practice Providers (APPs -  Physician Assistants and Nurse Practitioners) who all work together to provide you with the care you need, when you need it. You will need a follow up appointment in 1 year.  Please call our office 2 months in advance to schedule this appointment.  You may see [name], MD  or one of the following Advanced Practice Providers on your designated Care Team:   Rhonda Barrett, PA-C Kathryn Lawrence, DNP, ANP        

## 2019-10-02 DIAGNOSIS — B182 Chronic viral hepatitis C: Secondary | ICD-10-CM | POA: Diagnosis not present

## 2019-10-03 ENCOUNTER — Other Ambulatory Visit: Payer: Self-pay | Admitting: Gastroenterology

## 2019-10-03 ENCOUNTER — Other Ambulatory Visit (HOSPITAL_COMMUNITY): Payer: Self-pay | Admitting: Gastroenterology

## 2019-10-03 DIAGNOSIS — B182 Chronic viral hepatitis C: Secondary | ICD-10-CM

## 2019-10-09 ENCOUNTER — Ambulatory Visit (HOSPITAL_COMMUNITY)
Admission: RE | Admit: 2019-10-09 | Discharge: 2019-10-09 | Disposition: A | Discharge status: Home / Self Care | Payer: BC Managed Care – PPO | Source: Ambulatory Visit | Attending: Gastroenterology | Admitting: Gastroenterology

## 2019-10-09 ENCOUNTER — Other Ambulatory Visit: Payer: Self-pay

## 2019-10-09 DIAGNOSIS — B182 Chronic viral hepatitis C: Secondary | ICD-10-CM | POA: Diagnosis not present

## 2019-12-11 DIAGNOSIS — B182 Chronic viral hepatitis C: Secondary | ICD-10-CM | POA: Diagnosis not present

## 2019-12-11 DIAGNOSIS — Z8679 Personal history of other diseases of the circulatory system: Secondary | ICD-10-CM | POA: Diagnosis not present

## 2019-12-11 DIAGNOSIS — Z86010 Personal history of colonic polyps: Secondary | ICD-10-CM | POA: Diagnosis not present

## 2019-12-11 DIAGNOSIS — Z9889 Other specified postprocedural states: Secondary | ICD-10-CM | POA: Diagnosis not present

## 2019-12-26 ENCOUNTER — Encounter: Payer: Self-pay | Admitting: Emergency Medicine

## 2019-12-26 ENCOUNTER — Ambulatory Visit (INDEPENDENT_AMBULATORY_CARE_PROVIDER_SITE_OTHER): Payer: BC Managed Care – PPO | Admitting: Emergency Medicine

## 2019-12-26 ENCOUNTER — Other Ambulatory Visit: Payer: Self-pay

## 2019-12-26 VITALS — BP 112/76 | HR 79 | Temp 98.4°F | Resp 15 | Ht 64.0 in | Wt 164.0 lb

## 2019-12-26 DIAGNOSIS — R1031 Right lower quadrant pain: Secondary | ICD-10-CM

## 2019-12-26 NOTE — Progress Notes (Signed)
Bradley Hansen 68 y.o.   Chief Complaint  Patient presents with   Pelvic Pain    pt stats he has pain in the R side of his pelvis where his pelvis meets his R leg. 7-8/10 pain leve. pain comes and go and it's a Sharp aching painper pt.    HISTORY OF PRESENT ILLNESS: This is a 68 y.o. male complaining of right groin pain for at least a year.  Intermittent pain related to position.  No associated symptoms.  HPI   Prior to Admission medications   Medication Sig Start Date End Date Taking? Authorizing Provider  aspirin EC 81 MG tablet Take 1 tablet (81 mg total) by mouth daily. 09/09/15  Yes Minus Breeding, MD    No Known Allergies  Patient Active Problem List   Diagnosis Date Noted   Atrial flutter, post operative 06/04/2014   S/P minimally invasive mitral valve repair 04/26/2014   Mitral valve prolapse 03/26/2014   Mitral regurgitation    ATHEROSCLEROSIS OF AORTA 12/01/2010   BACK PAIN, CHRONIC 12/11/2009   MITRAL VALVE PROLAPSE 02/24/2007   GASTROESOPHAGEAL REFLUX, NO ESOPHAGITIS 02/24/2007    Past Medical History:  Diagnosis Date   Atrial flutter, post operative 06/04/2014   BACK PAIN, CHRONIC 12/11/2009   Qualifier: Diagnosis of  By: Lindell Noe MD, James     Fibromyalgia    GASTROESOPHAGEAL REFLUX, NO ESOPHAGITIS 02/24/2007   Qualifier: Diagnosis of  By: Eusebio Friendly     HEPATITIS C 02/24/2007   Qualifier: Diagnosis of  By: Eusebio Friendly     Hx of echocardiogram    Echo (04/2014):  EF 45-50%, diff HK, mild AI, MV repair ok (mean 5 mmHg), mod LAE, mild RAE, mild late TV systolic prolapse   Hypercholesteremia    Leukopenia    Mitral regurgitation    Mitral valve prolapse    TEE  08/2010 severe MR, bileaflet MVP, EF 60%   Prostadynia    Prostatitis    Rhinitis 2011   chronic    S/P minimally invasive mitral valve repair 04/26/2014   Complex valvuloplasty including artificial Gore-tex neocord placement x8, bovine pericardial patch augmentation  of P3 portion of posterior leaflet and 36 mm Sorin Memo 3D ring annuloplasty via right mini thoracotomy   Synovitis of hand 2011   right hand    Past Surgical History:  Procedure Laterality Date   APPENDECTOMY     CARDIOVERSION N/A 06/01/2014   Procedure: CARDIOVERSION;  Surgeon: Lelon Perla, MD;  Location: [place];  Service: Cardiovascular;  Laterality: N/A;   INTRAOPERATIVE TRANSESOPHAGEAL ECHOCARDIOGRAM N/A 04/26/2014   Procedure: INTRAOPERATIVE TRANSESOPHAGEAL ECHOCARDIOGRAM;  Surgeon: Rexene Alberts, MD;  Location: [place];  Service: Open Heart Surgery;  Laterality: N/A;   LEFT AND RIGHT HEART CATHETERIZATION WITH CORONARY ANGIOGRAM N/A 02/19/2014   Procedure: LEFT AND RIGHT HEART CATHETERIZATION WITH CORONARY ANGIOGRAM;  Surgeon: Jettie Booze, MD;  Location: [facility] CATH LAB;  Service: Cardiovascular;  Laterality: N/A;   MITRAL VALVE REPAIR Right 04/26/2014   Procedure: MINIMALLY INVASIVE MITRAL VALVE REPAIR (MVR);  Surgeon: Rexene Alberts, MD;  Location: [place];  Service: Open Heart Surgery;  Laterality: Right;   TEE WITHOUT CARDIOVERSION N/A 03/20/2013   Procedure: TRANSESOPHAGEAL ECHOCARDIOGRAM (TEE);  Surgeon: Thayer Headings, MD;  Location: [facility] ENDOSCOPY;  Service: Cardiovascular;  Laterality: N/A;    Social History   Socioeconomic History   Marital status: Divorced    Spouse name: Not on file   Number of children: Not on file  Years of education: Not on file   Highest education level: Not on file  Occupational History   Not on file  Tobacco Use   Smoking status: Never Smoker   Smokeless tobacco: Never Used  Substance and Sexual Activity   Alcohol use: Yes    Comment: week   Drug use: No   Sexual activity: Not on file  Other Topics Concern   Not on file  Social History Narrative   Not on file   Social Determinants of Health   Financial Resource Strain:    Difficulty of Paying Living Expenses: Not on file  Food Insecurity:    Worried  About Running Out of Food in the Last Year: Not on file   Ran Out of Food in the Last Year: Not on file  Transportation Needs:    Lack of Transportation (Medical): Not on file   Lack of Transportation (Non-Medical): Not on file  Physical Activity:    Days of Exercise per Week: Not on file   Minutes of Exercise per Session: Not on file  Stress:    Feeling of Stress : Not on file  Social Connections:    Frequency of Communication with Friends and Family: Not on file   Frequency of Social Gatherings with Friends and Family: Not on file   Attends Religious Services: Not on file   Active Member of Clubs or Organizations: Not on file   Attends BankerClub or Organization Meetings: Not on file   Marital Status: Not on file  Intimate Partner Violence:    Fear of Current or Ex-Partner: Not on file   Emotionally Abused: Not on file   Physically Abused: Not on file   Sexually Abused: Not on file    Family History  Problem Relation Age of Onset   Heart attack Neg Hx    Cancer Neg Hx    Stroke Neg Hx      Review of Systems  Constitutional: Negative.  Negative for chills and fever.  HENT: Negative for congestion and sore throat.   Respiratory: Negative.  Negative for cough and shortness of breath.   Cardiovascular: Negative.  Negative for chest pain and palpitations.  Gastrointestinal: Negative.  Negative for abdominal pain, diarrhea, nausea and vomiting.  Genitourinary: Negative.  Negative for dysuria, flank pain, hematuria and urgency.  Skin: Negative.  Negative for rash.  Neurological: Negative for dizziness and headaches.  All other systems reviewed and are negative.  Today's Vitals   12/26/19 1550  BP: 112/76  Pulse: 79  Resp: 15  Temp: 98.4 F (36.9 C)  TempSrc: Oral  SpO2: 99%  Weight: 164 lb (74.4 kg)  Height: 5\' 4"  (1.626 m)   Body mass index is 28.15 kg/m.   Physical Exam Vitals reviewed.  Constitutional:      Appearance: Normal appearance.    HENT:     Head: Normocephalic.  Eyes:     Extraocular Movements: Extraocular movements intact.     Pupils: Pupils are equal, round, and reactive to light.  Cardiovascular:     Rate and Rhythm: Normal rate and regular rhythm.     Pulses: Normal pulses.     Heart sounds: Normal heart sounds.  Pulmonary:     Effort: Pulmonary effort is normal.     Breath sounds: Normal breath sounds.  Abdominal:     General: Bowel sounds are normal. There is no distension.     Palpations: Abdomen is soft. There is no mass.     Tenderness: There  is no abdominal tenderness. There is no right CVA tenderness, left CVA tenderness, guarding or rebound.     Hernia: No hernia is present. There is no hernia in the left inguinal area or right inguinal area.  Genitourinary:    Penis: Normal.      Testes: Normal.        Right: Mass not present.        Left: Mass not present.     Epididymis:     Right: Normal.     Left: Normal.  Musculoskeletal:        General: Normal range of motion.     Cervical back: Normal range of motion and neck supple.  Lymphadenopathy:     Lower Body: No right inguinal adenopathy. No left inguinal adenopathy.  Skin:    General: Skin is warm and dry.  Neurological:     General: No focal deficit present.     Mental Status: He is alert and oriented to person, place, and time.  Psychiatric:        Mood and Affect: Mood normal.        Behavior: Behavior normal.      ASSESSMENT & PLAN: Ramesh was seen today for pelvic pain.  Diagnoses and all orders for this visit:  Right inguinal pain    Patient Instructions       If you have lab work done today you will be contacted with your lab results within the next 2 weeks.  If you have not heard from [place] then please contact us. The fastest way to get your results is to register for My Chart.   IF you received an x-ray today, you will receive an invoice from [facility]. Please contact [facility] at [phone]  with questions or concerns regarding your invoice.   IF you received labwork today, you will receive an invoice from [place]. Please contact [facility] at [phone] with questions or concerns regarding your invoice.   Our billing staff will not be able to assist you with questions regarding bills from these companies.  You will be contacted with the lab results as soon as they are available. The fastest way to get your results is to activate your My Chart account. Instructions are located on the last page of this paperwork. If you have not heard from [place] regarding the results in 2 weeks, please contact this office.     Adductor Muscle Strain  An adductor muscle strain, also called a groin strain or pull, is an injury to the muscles or tendons on the upper, inner part of the thigh. These muscles are called the adductor muscles or groin muscles. They are responsible for moving the legs across the body or pulling the legs together. A muscle strain occurs when a muscle is overstretched and some muscle fibers are torn. An adductor muscle strain can range from mild to severe, depending on how many muscle fibers are affected and whether the muscle fibers are partially or completely torn. What are the causes? Adductor muscle strains usually occur during exercise or while participating in sports. The injury often happens when a sudden, violent force is placed on a muscle, stretching the muscle too far. A strain is more likely to happen when your muscles are not warmed up or if you are not properly conditioned. This injury may be caused by:  Stretching the adductor muscles too far or too suddenly, often during side-to-side motion with a sudden change in direction.  Putting repeated stress on  the adductor muscles over a long period of time.  Performing vigorous activity without properly stretching the adductor muscles beforehand. What are the signs or symptoms? Symptoms of this condition  include:  Pain and tenderness in the groin area. This begins as sharp pain and persists as a dull ache.  A popping or snapping feeling when the injury occurs (for severe strains).  Swelling or bruising.  Muscle spasms.  Weakness in the leg.  Stiffness in the groin area with decreased ability to move the affected muscles. How is this diagnosed? This condition may be diagnosed based on:  Your symptoms and a description of how the injury occurred.  A physical exam.  Imaging tests, such as: ? X-rays. These are sometimes needed to rule out a broken bone or cartilage problems. ? An ultrasound, CT scan, or MRI. These may be done if your health care provider suspects a complete muscle tear or needs to check for other injuries. How is this treated? An adductor strain will often heal on its own. If needed, this condition may be treated with:  PRICE therapy. PRICE stands for protection of the injured area, rest, ice, pressure (compression), and elevation.  Medicines to help manage pain and swelling (anti-inflammatory medicines).  Crutches. You may be directed to use these for the first few days to minimize your pain. Depending on the severity of the muscle strain, recovery time may vary from a few weeks to several months. Severe injuries often require 4-6 weeks for recovery. In those cases, complete healing can take 4-5 months. Follow these instructions at home: PRICE Therapy   Protect the muscle from being injured again.  Rest. Do not use the strained muscle if it causes pain.  If directed, put ice on the injured area: ? Put ice in a plastic bag. ? Place a towel between your skin and the bag. ? Leave the ice on for 20 minutes, 2-3 times a day. Do this for the first 2 days after the injury.  Apply compression by wrapping the injured area with an elastic bandage as told by your health care provider.  Raise (elevate) the injured area above the level of your heart while you are  sitting or lying down. General instructions  Take over-the-counter and prescription medicines only as told by your health care provider.  Walk, stretch, and do exercises as told by your health care provider. Only do these activities if you can do so without any pain.  Follow your treatment plan as told by your health care provider. This may include: ? Physical therapy. ? Massage. ? Local electrical stimulation (transcutaneous electrical nerve stimulation, TENS). How is this prevented?  Warm up and stretch before being active.  Cool down and stretch after being active.  Give your body time to rest between periods of activity.  Make sure to use equipment that fits you.  Be safe and responsible while being active to avoid slips and falls.  Maintain physical fitness, including: ? Proper conditioning in the adductor muscles. ? Overall strength, flexibility, and endurance. Contact a health care provider if:  You have increased pain or swelling in the affected area.  Your symptoms are not improving or they are getting worse. Summary  An adductor muscle strain, also called a groin strain or pull, is an injury to the muscles or tendons on the upper, inner part of the thigh.  A muscle strain occurs when a muscle is overstretched and some muscle fibers are torn.  Depending on the severity  of the muscle strain, recovery time may vary from a few weeks to several months. This information is not intended to replace advice given to you by your health care provider. Make sure you discuss any questions you have with your health care provider. Document Released: 08/11/2004 Document Revised: 04/04/2019 Document Reviewed: 05/16/2018 Elsevier Patient Education  2020 [employer]      Edwina BarthMiguel Akina Maish, MD [facility]

## 2019-12-26 NOTE — Patient Instructions (Addendum)
If you have lab work done today you will be contacted with your lab results within the next 2 weeks.  If you have not heard from [place] then please contact us. The fastest way to get your results is to register for My Chart.   IF you received an x-ray today, you will receive an invoice from [facility]. Please contact [facility] at [phone] with questions or concerns regarding your invoice.   IF you received labwork today, you will receive an invoice from [facility]. Please contact [facility] at [phone] with questions or concerns regarding your invoice.   Our billing staff will not be able to assist you with questions regarding bills from these companies.  You will be contacted with the lab results as soon as they are available. The fastest way to get your results is to activate your My Chart account. Instructions are located on the last page of this paperwork. If you have not heard from [place] regarding the results in 2 weeks, please contact this office.     Adductor Muscle Strain  An adductor muscle strain, also called a groin strain or pull, is an injury to the muscles or tendons on the upper, inner part of the thigh. These muscles are called the adductor muscles or groin muscles. They are responsible for moving the legs across the body or pulling the legs together. A muscle strain occurs when a muscle is overstretched and some muscle fibers are torn. An adductor muscle strain can range from mild to severe, depending on how many muscle fibers are affected and whether the muscle fibers are partially or completely torn. What are the causes? Adductor muscle strains usually occur during exercise or while participating in sports. The injury often happens when a sudden, violent force is placed on a muscle, stretching the muscle too far. A strain is more likely to happen when your muscles are not warmed up or if you are not properly conditioned. This injury may be caused  by:  Stretching the adductor muscles too far or too suddenly, often during side-to-side motion with a sudden change in direction.  Putting repeated stress on the adductor muscles over a long period of time.  Performing vigorous activity without properly stretching the adductor muscles beforehand. What are the signs or symptoms? Symptoms of this condition include:  Pain and tenderness in the groin area. This begins as sharp pain and persists as a dull ache.  A popping or snapping feeling when the injury occurs (for severe strains).  Swelling or bruising.  Muscle spasms.  Weakness in the leg.  Stiffness in the groin area with decreased ability to move the affected muscles. How is this diagnosed? This condition may be diagnosed based on:  Your symptoms and a description of how the injury occurred.  A physical exam.  Imaging tests, such as: ? X-rays. These are sometimes needed to rule out a broken bone or cartilage problems. ? An ultrasound, CT scan, or MRI. These may be done if your health care provider suspects a complete muscle tear or needs to check for other injuries. How is this treated? An adductor strain will often heal on its own. If needed, this condition may be treated with:  PRICE therapy. PRICE stands for protection of the injured area, rest, ice, pressure (compression), and elevation.  Medicines to help manage pain and swelling (anti-inflammatory medicines).  Crutches. You may be directed to use these for the first few days to minimize your pain. Depending on  the severity of the muscle strain, recovery time may vary from a few weeks to several months. Severe injuries often require 4-6 weeks for recovery. In those cases, complete healing can take 4-5 months. Follow these instructions at home: [place] the muscle from being injured again.  Rest. Do not use the strained muscle if it causes pain.  If directed, put ice on the injured area: ? Put  ice in a plastic bag. ? Place a towel between your skin and the bag. ? Leave the ice on for 20 minutes, 2-3 times a day. Do this for the first 2 days after the injury.  Apply compression by wrapping the injured area with an elastic bandage as told by your health care provider.  Raise (elevate) the injured area above the level of your heart while you are sitting or lying down. General instructions  Take over-the-counter and prescription medicines only as told by your health care provider.  Walk, stretch, and do exercises as told by your health care provider. Only do these activities if you can do so without any pain.  Follow your treatment plan as told by your health care provider. This may include: ? Physical therapy. ? Massage. ? Local electrical stimulation (transcutaneous electrical nerve stimulation, TENS). How is this prevented?  Warm up and stretch before being active.  Cool down and stretch after being active.  Give your body time to rest between periods of activity.  Make sure to use equipment that fits you.  Be safe and responsible while being active to avoid slips and falls.  Maintain physical fitness, including: ? Proper conditioning in the adductor muscles. ? Overall strength, flexibility, and endurance. Contact a health care provider if:  You have increased pain or swelling in the affected area.  Your symptoms are not improving or they are getting worse. Summary  An adductor muscle strain, also called a groin strain or pull, is an injury to the muscles or tendons on the upper, inner part of the thigh.  A muscle strain occurs when a muscle is overstretched and some muscle fibers are torn.  Depending on the severity of the muscle strain, recovery time may vary from a few weeks to several months. This information is not intended to replace advice given to you by your health care provider. Make sure you discuss any questions you have with your health care  provider. Document Released: 08/11/2004 Document Revised: 04/04/2019 Document Reviewed: 05/16/2018 Elsevier Patient Education  2020 [employer].

## 2020-01-24 DIAGNOSIS — D649 Anemia, unspecified: Secondary | ICD-10-CM | POA: Diagnosis not present

## 2020-01-24 DIAGNOSIS — Z86010 Personal history of colonic polyps: Secondary | ICD-10-CM | POA: Diagnosis not present

## 2020-03-25 ENCOUNTER — Encounter: Payer: Self-pay | Admitting: Emergency Medicine

## 2020-03-25 DIAGNOSIS — Z125 Encounter for screening for malignant neoplasm of prostate: Secondary | ICD-10-CM | POA: Diagnosis not present

## 2020-07-07 IMAGING — US US LIVER ELASTOGRAPHY
1 series · 13 of 25 positions shown · non-contrast
Comparison: None.

CLINICAL DATA: Chronic hepatitis C

EXAM:
US LIVER ELASTOGRAPHY
TECHNIQUE: Sonography of the liver was performed. In addition, ultrasound
elastography evaluation of the liver was performed. A region of
interest was placed within the right lobe of the liver. Following
application of a compressive sonographic pulse, tissue
compressibility was assessed. Multiple assessments were performed at
the selected site. Median tissue compressibility was determined.
Previously, hepatic stiffness was assessed by shear wave velocity.
Based on recently published Society of Radiologists in Ultrasound
consensus article, reporting is now recommended to be performed in
the SI units of pressure (kiloPascals) representing hepatic
stiffness/elasticity. The obtained result is compared to the
published reference standards. (cACLD= compensated Advanced Chronic
Liver Disease)

[Series 1: us liver elastography · 13 of 33 slices shown]
[im 1/33]
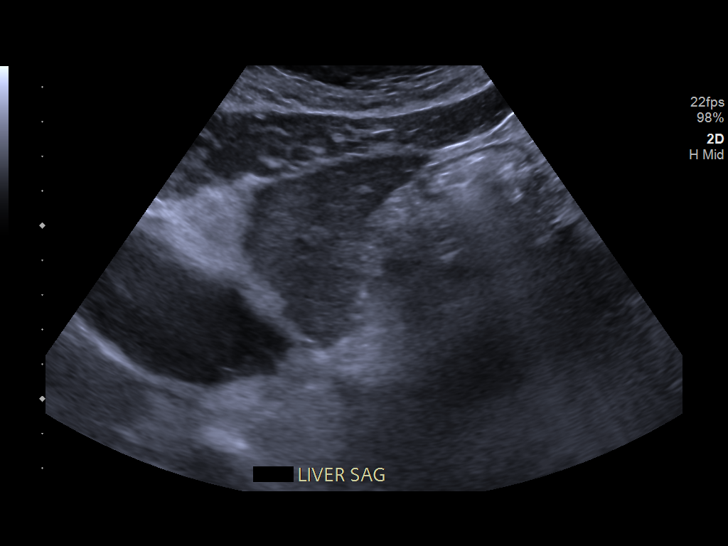
[im 3/33]
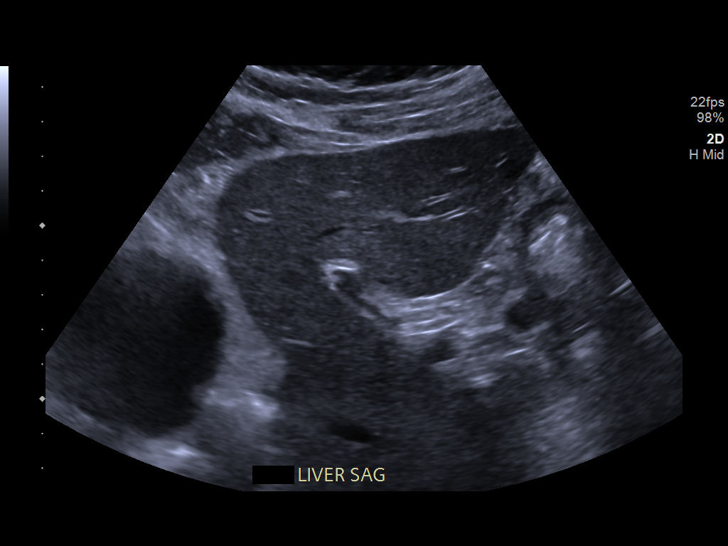
[im 6/33]
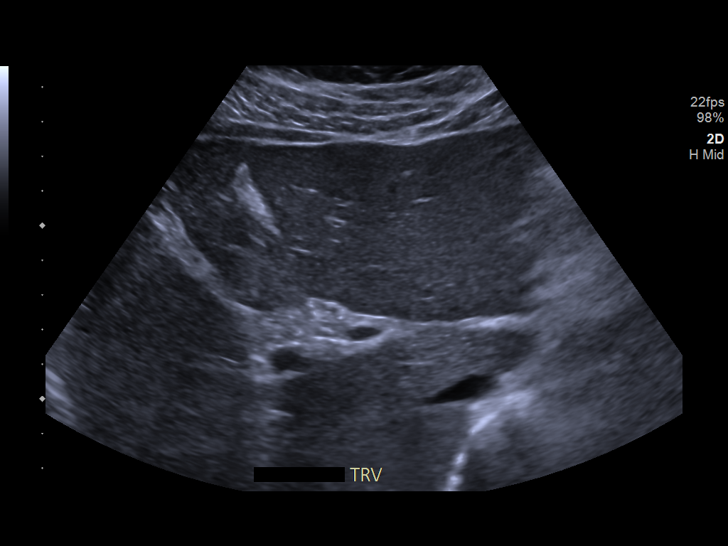
[im 9/33]
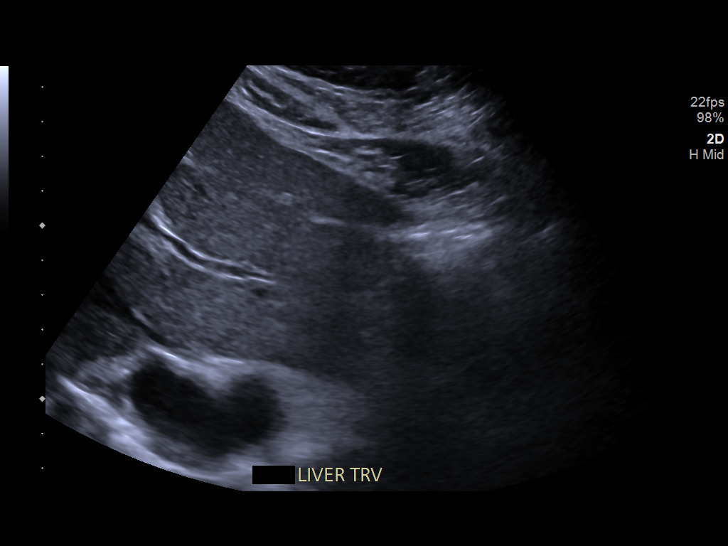
[im 11/33]
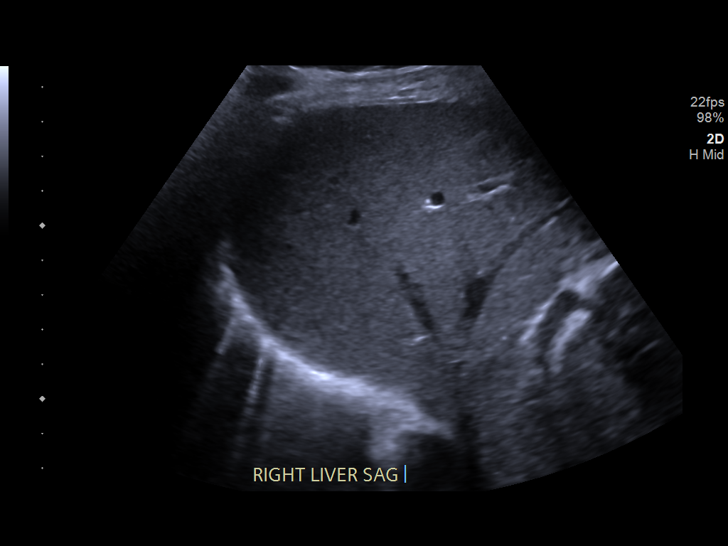
[im 14/33]
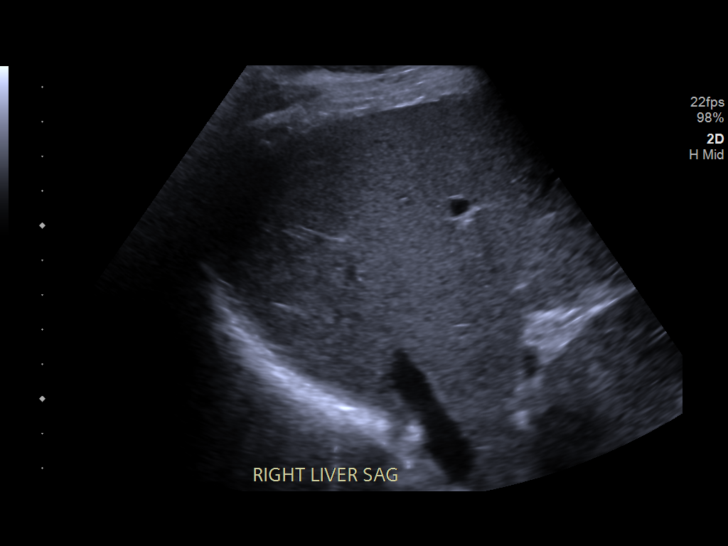
[im 17/33]
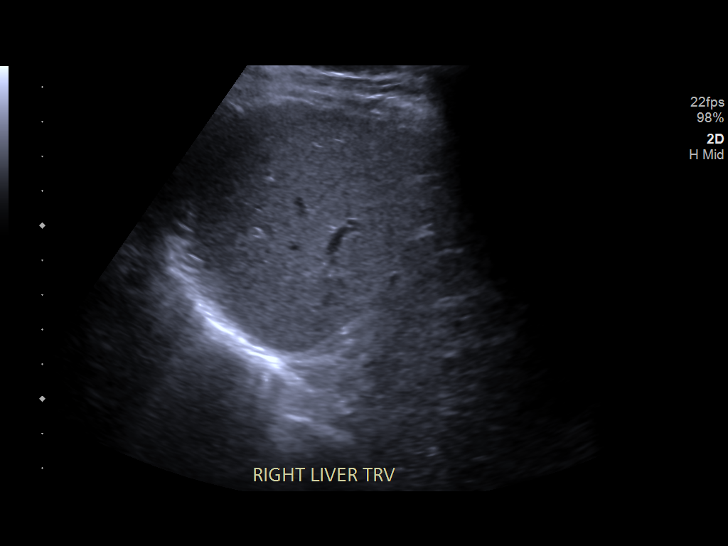
[im 19/33]
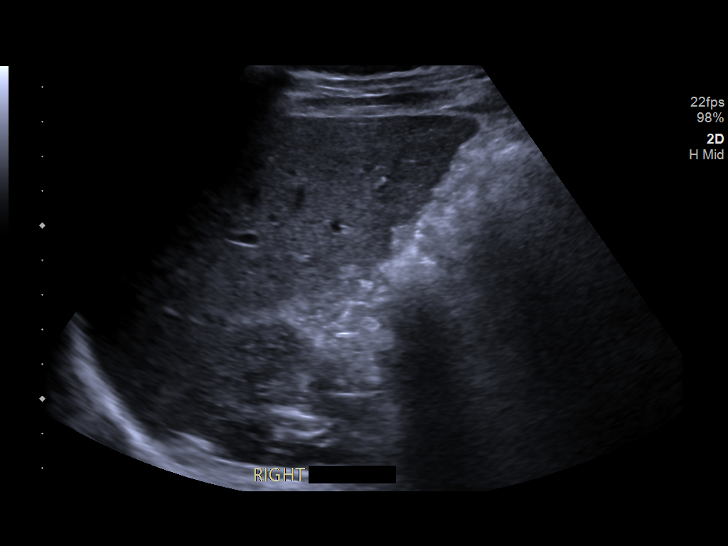
[im 22/33]
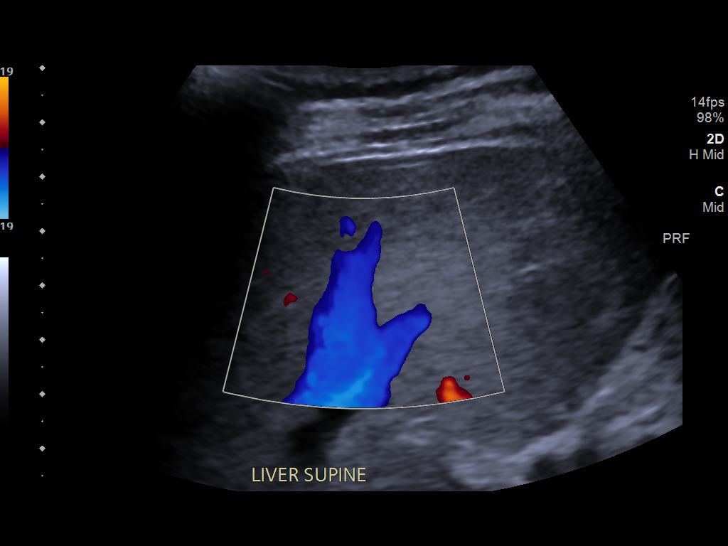
[im 25/33]
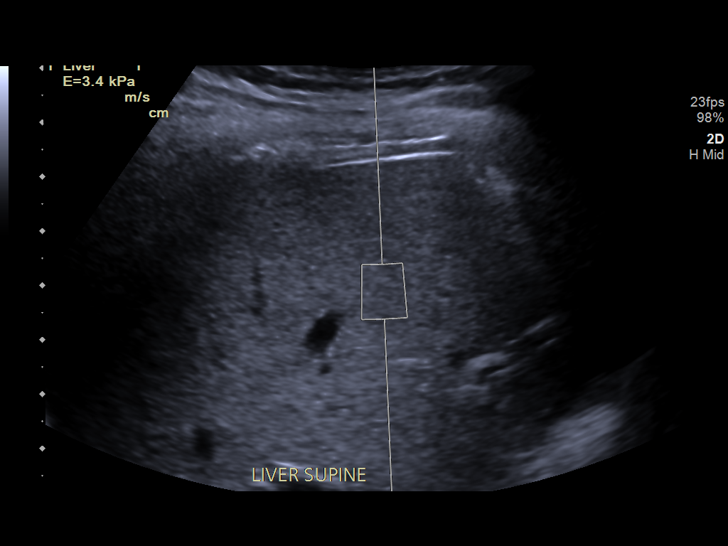
[im 27/33]
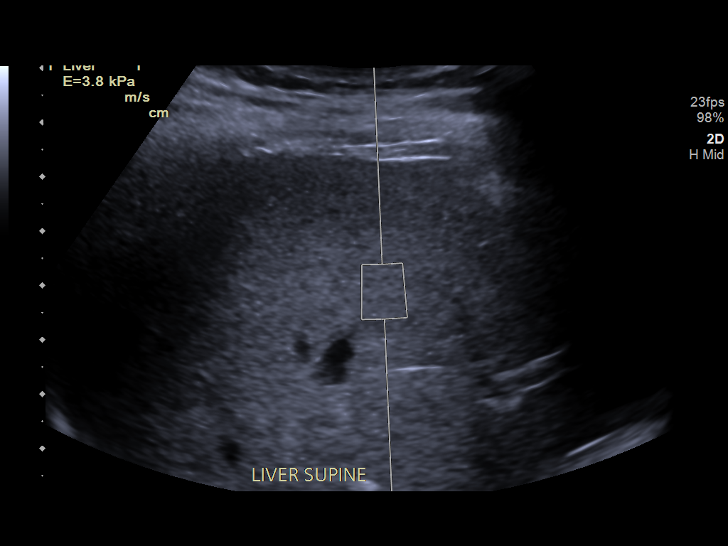
[im 30/33]
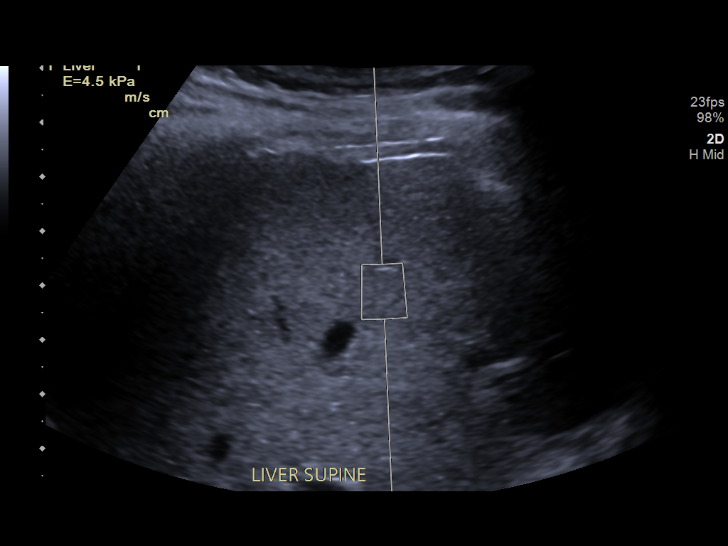
[im 33/33]
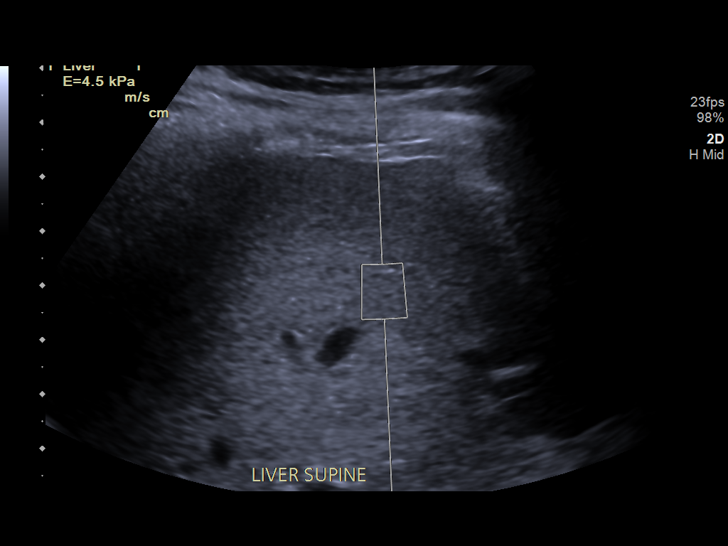

[13 of 25 positions shown; findings below may reference images not displayed]

FINDINGS: Liver: No focal lesion identified. Within normal limits in
parenchymal echogenicity. Portal vein is patent on color Doppler
imaging with normal direction of blood flow towards the liver.

ULTRASOUND HEPATIC ELASTOGRAPHY

Device: Siemens Helix VTQ

Patient position: Supine

Transducer: [id number]

Number of measurements: 10

Hepatic segment:  8

Median kPa:

IQR:

IQR/Median kPa ratio:

Data quality:  Good

Diagnostic category:  ?5 kPa: high probability of being normal
IMPRESSION: ULTRASOUND LIVER:

Unremarkable.

ULTRASOUND HEPATIC ELASTOGRAPHY:

Median kPa:

Diagnostic category:  High probability of being normal.

The use of hepatic elastography is applicable to patients with viral
hepatitis and non-alcoholic fatty liver disease. At this time, there
is insufficient data for the referenced cut-off values and use in
other causes of liver disease, including alcoholic liver disease.
Patients, however, may be assessed by elastography and serve as
their own reference standard/baseline.

In patients with non-alcoholic liver disease, the values suggesting
compensated advanced chronic liver disease (cACLD) may be lower, and
patients may need additional testing with elasticity results of [DATE]
kPa.

Please note that abnormal hepatic elasticity and shear wave
velocities may also be identified in clinical settings other than
with hepatic fibrosis, such as: acute hepatitis, elevated right
heart and central venous pressures including use of beta blockers,
Aujla disease ([place]), infiltrative processes such as
mastocytosis/amyloidosis/infiltrative tumor/lymphoma, extrahepatic
cholestasis, with hyperemia in the post-prandial state, and with
liver transplantation. Correlation with patient history, laboratory
data, and clinical condition recommended.

## 2020-09-08 DIAGNOSIS — Z7189 Other specified counseling: Secondary | ICD-10-CM | POA: Insufficient documentation

## 2020-09-08 DIAGNOSIS — I428 Other cardiomyopathies: Secondary | ICD-10-CM | POA: Insufficient documentation

## 2020-09-08 NOTE — Progress Notes (Signed)
Cardiology Office Note   Date:  09/09/2020   ID:  Bradley Hansen, DOB 1951/06/05, MRN [id number]  PCP:  Bradley Buddy, PA  Cardiologist:   No primary care provider on file.   Chief Complaint  Patient presents with  . MV Repair      History of Present Illness: Bradley Hansen is a 69 y.o. male who presents for followup of minimally invasive mitral valve repair by Dr. Cornelius Moras. He had postoperative atrial fibrillation and DCCV.  He was on amiodarone. This was discontinued secondary to bradycardia. However, at followup with Dr. Cornelius Moras he was noted to be back in atrial flutter. He was again placed on amiodarone.  He converted on this back into sinus.  However follow up EKG demonstrated a markedly abnormal EKG with QT prolongation and T wave inversion.  I stopped the amiodarone.  Follow up EKG demonstrated resolution of these changes.   He has a mildly reduced EF but stable repair on echo in Apirl 2017 and again in 2019.    Since he was last seen he has done quite well.  He still goes to the gym and walks on a treadmill. The patient denies any new symptoms such as chest discomfort, neck or arm discomfort. There has been no new shortness of breath, PND or orthopnea. There have been no reported palpitations, presyncope or syncope.   Past Medical History:  Diagnosis Date  . Atrial flutter, post operative 06/04/2014  . BACK PAIN, CHRONIC 12/11/2009   Qualifier: Diagnosis of  By: Mauricio Po MD, Fayrene Fearing    . Fibromyalgia   . GASTROESOPHAGEAL REFLUX, NO ESOPHAGITIS 02/24/2007   Qualifier: Diagnosis of  By: Abundio Miu    . HEPATITIS C 02/24/2007   Qualifier: Diagnosis of  By: Abundio Miu    . Hx of echocardiogram    Echo (04/2014):  EF 45-50%, diff HK, mild AI, MV repair ok (mean 5 mmHg), mod LAE, mild RAE, mild late TV systolic prolapse  . Hypercholesteremia   . Leukopenia   . Mitral regurgitation   . Mitral valve prolapse    TEE  08/2010 severe MR, bileaflet MVP, EF 60%  . Prostadynia   .  Prostatitis   . Rhinitis 2011   chronic   . S/P minimally invasive mitral valve repair 04/26/2014   Complex valvuloplasty including artificial Gore-tex neocord placement x8, bovine pericardial patch augmentation of P3 portion of posterior leaflet and 36 mm Sorin Memo 3D ring annuloplasty via right mini thoracotomy  . Synovitis of hand 2011   right hand    Past Surgical History:  Procedure Laterality Date  . APPENDECTOMY    . CARDIOVERSION N/A 06/01/2014   Procedure: CARDIOVERSION;  Surgeon: [name], MD;  Location[facility] ENDOSCOPY;  Service: Cardiovascular;  Laterality: N/A;  . INTRAOPERATIVE TRANSESOPHAGEAL ECHOCARDIOGRAM N/A 04/26/2014   Procedure: INTRAOPERATIVE TRANSESOPHAGEAL ECHOCARDIOGRAM;  Surgeon: Purcell Nails, MD;  Location: [facility] OR;  Service: Open Heart Surgery;  Laterality: N/A;  . LEFT AND RIGHT HEART CATHETERIZATION WITH CORONARY ANGIOGRAM N/A 02/19/2014   Procedure: LEFT AND RIGHT HEART CATHETERIZATION WITH CORONARY ANGIOGRAM;  Surgeon: Corky Crafts, MD;  Location: [facility] CATH LAB;  Service: Cardiovascular;  Laterality: N/A;  . MITRAL VALVE REPAIR Right 04/26/2014   Procedure: MINIMALLY INVASIVE MITRAL VALVE REPAIR (MVR);  Surgeon: Purcell Nails, MD;  Location: [facility] OR;  Service: Open Heart Surgery;  Laterality: Right;  . TEE WITHOUT CARDIOVERSION N/A 03/20/2013   Procedure: TRANSESOPHAGEAL ECHOCARDIOGRAM (TEE);  Surgeon: Deloris Ping  Nahser, MD;  Location: [facility] ENDOSCOPY;  Service: Cardiovascular;  Laterality: N/A;     Current Outpatient Medications  Medication Sig Dispense Refill  . aspirin EC 81 MG tablet Take 1 tablet (81 mg total) by mouth daily. 90 tablet 3   No current facility-administered medications for this visit.    Allergies:   Patient has no known allergies.   ROS:  Please see the history of present illness.   Otherwise, review of systems are positive for none.   All other systems are reviewed and negative.    PHYSICAL EXAM: VS:  BP (!) 152/84   Pulse  73   Ht 5\' 4"  (1.626 m)   Wt 158 lb 6.4 oz (71.8 kg)   SpO2 99%   BMI 27.19 kg/m  , BMI Body mass index is 27.19 kg/m. GENERAL:  Well appearing NECK:  No jugular venous distention, waveform within normal limits, carotid upstroke brisk and symmetric, no bruits, no thyromegaly LUNGS:  Clear to auscultation bilaterally BACK:  No CVA tenderness CHEST: Well healed thoracotomy scar. HEART:  PMI not displaced or sustained,S1 and S2 within normal limits, no S3, no S4, no clicks, no rubs, no murmurs ABD:  Flat, positive bowel sounds normal in frequency in pitch, no bruits, no rebound, no guarding, no midline pulsatile mass, no hepatomegaly, no splenomegaly EXT:  2 plus pulses throughout, no edema, no cyanosis no clubbing   EKG:  EKG is ordered today. The ekg ordered today demonstrates sinus rhythm, rate 66, axis within normal limits, intervals within normal limits, no acute ST-T wave changes.   Recent Labs: No results found for requested labs within last 8760 hours.    Lipid Panel    Component Value Date/Time   CHOL 179 02/01/2012 1001   TRIG 58 02/01/2012 1001   HDL 58 02/01/2012 1001   CHOLHDL 3.1 02/01/2012 1001   VLDL 12 02/01/2012 1001   LDLCALC 109 (H) 02/01/2012 1001      Wt Readings from Last 3 Encounters:  09/09/20 158 lb 6.4 oz (71.8 kg)  12/26/19 164 lb (74.4 kg)  09/11/19 164 lb (74.4 kg)      Other studies Reviewed: Additional studies/ records that were reviewed today include: None. Review of the above records demonstrates:  Please see elsewhere in the note.     ASSESSMENT AND PLAN:  ATRIAL FLUTTER:   He has had no symptomatic recurrence of this.  No change in therapy.  MITRAL VALVE REPAIR:     I will check an echocardiogram in March which will have been 3 years from the previous.  He understands endocarditis prophylaxis.   MILDLY REDUCED EF:    I will review this as above.  HEALTH MAINTENANCE: I will check a CBC and basic metabolic profile when he  returns for his echocardiogram.  COVID EDUCATION: He has had his vaccines.   Current medicines are reviewed at length with the patient today.  The patient does not have concerns regarding medicines.  The following changes have been made:  no change  Labs/ tests ordered today include:   Orders Placed This Encounter  Procedures  . Basic metabolic panel  . CBC  . EKG 12-Lead  . ECHOCARDIOGRAM COMPLETE     Disposition:   FU with me in one year.      Signed, April, MD  09/09/2020 10:33 AM    [facility]

## 2020-09-09 ENCOUNTER — Encounter: Payer: Self-pay | Admitting: Cardiology

## 2020-09-09 ENCOUNTER — Ambulatory Visit (INDEPENDENT_AMBULATORY_CARE_PROVIDER_SITE_OTHER): Payer: BC Managed Care – PPO | Admitting: Cardiology

## 2020-09-09 ENCOUNTER — Other Ambulatory Visit: Payer: Self-pay

## 2020-09-09 VITALS — BP 152/84 | HR 73 | Ht 64.0 in | Wt 158.4 lb

## 2020-09-09 DIAGNOSIS — I428 Other cardiomyopathies: Secondary | ICD-10-CM

## 2020-09-09 DIAGNOSIS — Z9889 Other specified postprocedural states: Secondary | ICD-10-CM

## 2020-09-09 DIAGNOSIS — Z7189 Other specified counseling: Secondary | ICD-10-CM

## 2020-09-09 DIAGNOSIS — I483 Typical atrial flutter: Secondary | ICD-10-CM | POA: Diagnosis not present

## 2020-09-09 NOTE — Patient Instructions (Signed)
Medication Instructions:  NO CHANGES *If you need a refill on your cardiac medications before your next appointment, please call your pharmacy*   Lab Work:  CBC  BMP - NO FASTING IN March 2022 If you have labs (blood work) drawn today and your tests are completely normal, you will receive your results only by: Marland Kitchen MyChart Message (if you have MyChart) OR . A paper copy in the mail If you have any lab test that is abnormal or we need to change your treatment, we will call you to review the results.   Testing/Procedures: Will be schedule at [facility] [address]- March 2022 Your physician has requested that you have an echocardiogram. Echocardiography is a painless test that uses sound waves to create images of your heart. It provides your doctor with information about the size and shape of your heart and how well your heart's chambers and valves are working. This procedure takes approximately one hour. There are no restrictions for this procedure.     Follow-Up: At [facility], you and your health needs are our priority.  As part of our continuing mission to provide you with exceptional heart care, we have created designated Provider Care Teams.  These Care Teams include your primary Cardiologist (physician) and Advanced Practice Providers (APPs -  Physician Assistants and Nurse Practitioners) who all work together to provide you with the care you need, when you need it.  We recommend signing up for the patient portal called "MyChart".  Sign up information is provided on this After Visit Summary.  MyChart is used to connect with patients for Virtual Visits (Telemedicine).  Patients are able to view lab/test results, encounter notes, upcoming appointments, etc.  Non-urgent messages can be sent to your provider as well.   To learn more about what you can do with MyChart, go to [email or web address].    Your next appointment:   12 month(s)  The format for your next  appointment:   In Person  Provider:   Rollene Rotunda, MD

## 2021-03-10 ENCOUNTER — Ambulatory Visit (HOSPITAL_COMMUNITY): Payer: BC Managed Care – PPO | Attending: Cardiology

## 2021-03-10 ENCOUNTER — Other Ambulatory Visit: Payer: Self-pay

## 2021-03-10 DIAGNOSIS — I428 Other cardiomyopathies: Secondary | ICD-10-CM | POA: Diagnosis not present

## 2021-03-10 DIAGNOSIS — Z9889 Other specified postprocedural states: Secondary | ICD-10-CM | POA: Diagnosis not present

## 2021-03-10 DIAGNOSIS — I483 Typical atrial flutter: Secondary | ICD-10-CM

## 2021-03-10 LAB — ECHOCARDIOGRAM COMPLETE
Area-P 1/2: 2.79 cm2
MV VTI: 1.48 cm2
S' Lateral: 3.1 cm

## 2021-03-14 DIAGNOSIS — R4689 Other symptoms and signs involving appearance and behavior: Secondary | ICD-10-CM | POA: Diagnosis not present

## 2021-03-14 DIAGNOSIS — D509 Iron deficiency anemia, unspecified: Secondary | ICD-10-CM | POA: Diagnosis not present

## 2021-03-14 DIAGNOSIS — R42 Dizziness and giddiness: Secondary | ICD-10-CM | POA: Diagnosis not present

## 2021-03-14 DIAGNOSIS — B182 Chronic viral hepatitis C: Secondary | ICD-10-CM | POA: Diagnosis not present

## 2021-04-21 DIAGNOSIS — R300 Dysuria: Secondary | ICD-10-CM | POA: Diagnosis not present

## 2021-04-21 DIAGNOSIS — N401 Enlarged prostate with lower urinary tract symptoms: Secondary | ICD-10-CM | POA: Diagnosis not present

## 2021-04-21 DIAGNOSIS — R42 Dizziness and giddiness: Secondary | ICD-10-CM | POA: Diagnosis not present

## 2021-04-28 DIAGNOSIS — R300 Dysuria: Secondary | ICD-10-CM | POA: Diagnosis not present

## 2021-04-28 DIAGNOSIS — N401 Enlarged prostate with lower urinary tract symptoms: Secondary | ICD-10-CM | POA: Diagnosis not present

## 2021-06-02 DIAGNOSIS — B182 Chronic viral hepatitis C: Secondary | ICD-10-CM | POA: Diagnosis not present

## 2021-07-11 DIAGNOSIS — Z20822 Contact with and (suspected) exposure to covid-19: Secondary | ICD-10-CM | POA: Diagnosis not present

## 2021-10-13 DIAGNOSIS — B182 Chronic viral hepatitis C: Secondary | ICD-10-CM | POA: Diagnosis not present

## 2022-01-19 ENCOUNTER — Encounter: Payer: Self-pay | Admitting: Cardiology

## 2022-01-19 ENCOUNTER — Ambulatory Visit (INDEPENDENT_AMBULATORY_CARE_PROVIDER_SITE_OTHER): Payer: BC Managed Care – PPO | Admitting: Cardiology

## 2022-01-19 ENCOUNTER — Other Ambulatory Visit: Payer: Self-pay

## 2022-01-19 VITALS — BP 144/78 | HR 77 | Ht 64.0 in | Wt 168.2 lb

## 2022-01-19 DIAGNOSIS — I483 Typical atrial flutter: Secondary | ICD-10-CM

## 2022-01-19 NOTE — Patient Instructions (Signed)
Medication Instructions:  Your physician recommends that you continue on your current medications as directed. Please refer to the Current Medication list given to you today.   *If you need a refill on your cardiac medications before your next appointment, please call your pharmacy*  Lab Work: BMET/CBC IN [facility]   If you have labs (blood work) drawn today and your tests are completely normal, you will receive your results only by: MyChart Message (if you have MyChart) OR A paper copy in the mail If you have any lab test that is abnormal or we need to change your treatment, we will call you to review the results.  Testing/Procedures: NONE  Follow-Up: At [facility], you and your health needs are our priority.  As part of our continuing mission to provide you with exceptional heart care, we have created designated Provider Care Teams.  These Care Teams include your primary Cardiologist (physician) and Advanced Practice Providers (APPs -  Physician Assistants and Nurse Practitioners) who all work together to provide you with the care you need, when you need it.  We recommend signing up for the patient portal called "MyChart".  Sign up information is provided on this After Visit Summary.  MyChart is used to connect with patients for Virtual Visits (Telemedicine).  Patients are able to view lab/test results, encounter notes, upcoming appointments, etc.  Non-urgent messages can be sent to your provider as well.   To learn more about what you can do with MyChart, go to [email or web address].    Your next appointment:   12 month(s)  The format for your next appointment:   In Person  Provider:   DR [facility]   Other Instructions  OMRON IS SUGGESTED BLOOD PRESSURE MACHINE, NOT WRIST KIND

## 2022-01-19 NOTE — Progress Notes (Signed)
Cardiology Office Note   Date:  01/19/2022   ID:  Bradley Hansen, DOB 1951-06-23, MRN [id number]  PCP:  No primary care provider on file.  Cardiologist:   Rollene Rotunda, MD   Chief Complaint  Patient presents with   Mitral Valve Repair      History of Present Illness: Bradley Hansen is a 71 y.o. male who presents for followup of minimally invasive mitral valve repair by Dr. Cornelius Moras. He had postoperative atrial fibrillation and DCCV.  He was on amiodarone. This was discontinued secondary to bradycardia. However, at followup with Dr. Cornelius Moras he was noted to be back in atrial flutter. He was again placed on amiodarone.  He converted on this back into sinus.  However follow up EKG demonstrated a markedly abnormal EKG with QT prolongation and T wave inversion.  I stopped the amiodarone.  Follow up EKG demonstrated resolution of these changes.   He has a mildly reduced EF but stable repair on echo in Apirl 2017 and again in 2019 and then again in 2022.    Since he was last seen he has done well.  He does go to the gym occasionally.  He works out on the treadmill.  He does not get any chest pressure, neck or arm discomfort.  He does not report any shortness of breath, PND or orthopnea.  He has had no new palpitations, presyncope or syncope.  He has had no weight gain or edema.     Past Medical History:  Diagnosis Date   Atrial flutter, post operative 06/04/2014   BACK PAIN, CHRONIC 12/11/2009   Qualifier: Diagnosis of  By: Mauricio Po MD, Gentry Seeber     Fibromyalgia    GASTROESOPHAGEAL REFLUX, NO ESOPHAGITIS 02/24/2007   Qualifier: Diagnosis of  By: Abundio Miu     HEPATITIS C 02/24/2007   Qualifier: Diagnosis of  By: Abundio Miu     Hx of echocardiogram    Echo (04/2014):  EF 45-50%, diff HK, mild AI, MV repair ok (mean 5 mmHg), mod LAE, mild RAE, mild late TV systolic prolapse   Hypercholesteremia    Leukopenia    Mitral regurgitation    Mitral valve prolapse    TEE  08/2010 severe MR,  bileaflet MVP, EF 60%   Prostadynia    Prostatitis    Rhinitis 2011   chronic    S/P minimally invasive mitral valve repair 04/26/2014   Complex valvuloplasty including artificial Gore-tex neocord placement x8, bovine pericardial patch augmentation of P3 portion of posterior leaflet and 36 mm Sorin Memo 3D ring annuloplasty via right mini thoracotomy   Synovitis of hand 2011   right hand    Past Surgical History:  Procedure Laterality Date   APPENDECTOMY     CARDIOVERSION N/A 06/01/2014   Procedure: CARDIOVERSION;  Surgeon: [name], MD;  Location: [facility] ENDOSCOPY;  Service: Cardiovascular;  Laterality: N/A;   INTRAOPERATIVE TRANSESOPHAGEAL ECHOCARDIOGRAM N/A 04/26/2014   Procedure: INTRAOPERATIVE TRANSESOPHAGEAL ECHOCARDIOGRAM;  Surgeon: Purcell Nails, MD;  Location: [facility] OR;  Service: Open Heart Surgery;  Laterality: N/A;   LEFT AND RIGHT HEART CATHETERIZATION WITH CORONARY ANGIOGRAM N/A 02/19/2014   Procedure: LEFT AND RIGHT HEART CATHETERIZATION WITH CORONARY ANGIOGRAM;  Surgeon: Corky Crafts, MD;  Location: [facility] CATH LAB;  Service: Cardiovascular;  Laterality: N/A;   MITRAL VALVE REPAIR Right 04/26/2014   Procedure: MINIMALLY INVASIVE MITRAL VALVE REPAIR (MVR);  Surgeon: Purcell Nails, MD;  Location[facility] OR;  Service: Open Heart Surgery;  Laterality:  Right;   TEE WITHOUT CARDIOVERSION N/A 03/20/2013   Procedure: TRANSESOPHAGEAL ECHOCARDIOGRAM (TEE);  Surgeon: [name], MD;  Location: [facility] ENDOSCOPY;  Service: Cardiovascular;  Laterality: N/A;     No current outpatient medications on file.   No current facility-administered medications for this visit.    Allergies:   Patient has no known allergies.   ROS:  Please see the history of present illness.   Otherwise, review of systems are positive for none.   All other systems are reviewed and negative.    PHYSICAL EXAM: VS:  BP (!) 144/78    Pulse 77    Ht 5\' 4"  (1.626 m)    Wt 168 lb 3.2 oz (76.3 kg)    SpO2 97%    BMI  28.87 kg/m  , BMI Body mass index is 28.87 kg/m. GENERAL:  Well appearing NECK:  No jugular venous distention, waveform within normal limits, carotid upstroke brisk and symmetric, no bruits, no thyromegaly LUNGS:  Clear to auscultation bilaterally CHEST: Well-healed surgical scar HEART:  PMI not displaced or sustained,S1 and S2 within normal limits, no S3, no S4, no clicks, no rubs, no murmurs ABD:  Flat, positive bowel sounds normal in frequency in pitch, no bruits, no rebound, no guarding, no midline pulsatile mass, no hepatomegaly, no splenomegaly EXT:  2 plus pulses throughout, no edema, no cyanosis no clubbing    EKG:  EKG is ordered today. The ekg ordered today demonstrates sinus rhythm, rate 77, axis within normal limits, intervals within normal limits, no acute ST-T wave changes.   Recent Labs: No results found for requested labs within last 8760 hours.    Lipid Panel    Component Value Date/Time   CHOL 179 02/01/2012 1001   TRIG 58 02/01/2012 1001   HDL 58 02/01/2012 1001   CHOLHDL 3.1 02/01/2012 1001   VLDL 12 02/01/2012 1001   LDLCALC 109 (H) 02/01/2012 1001      Wt Readings from Last 3 Encounters:  01/19/22 168 lb 3.2 oz (76.3 kg)  09/09/20 158 lb 6.4 oz (71.8 kg)  12/26/19 164 lb (74.4 kg)      Other studies Reviewed: Additional studies/ records that were reviewed today include: None. Review of the above records demonstrates:  Please see elsewhere in the note.     ASSESSMENT AND PLAN:  ATRIAL FLUTTER:     He has had no symptomatic recurrence of this.  No change in therapy.       MITRAL VALVE REPAIR:    This was stable on echo last year.  I will follow this clinically.  No change in therapy.   MILDLY REDUCED EF:   EF was in the low normal range last year.  There is mild to moderate concentric left ventricular hypertrophy.  I will follow this clinically.  HTN: Blood pressure is mildly elevated and he is going to get an [employer] blood pressure  cuff.  HEALTH MAINTENANCE: I will check a CBC and basic metabolic profile in March.   Current medicines are reviewed at length with the patient today.  The patient does not have concerns regarding medicines.  The following changes have been made:  None  Labs/ tests ordered today include:   Orders Placed This Encounter  Procedures   CBC with Differential/Platelet   Basic metabolic panel   EKG 12-Lead     Disposition:   FU with me in one year.      Signed, April, MD  01/19/2022 12:31 PM  [facility]

## 2022-02-05 DIAGNOSIS — R42 Dizziness and giddiness: Secondary | ICD-10-CM | POA: Diagnosis not present

## 2022-02-05 DIAGNOSIS — Z13220 Encounter for screening for lipoid disorders: Secondary | ICD-10-CM | POA: Diagnosis not present

## 2022-02-05 DIAGNOSIS — R519 Headache, unspecified: Secondary | ICD-10-CM | POA: Diagnosis not present

## 2022-02-05 DIAGNOSIS — Z0000 Encounter for general adult medical examination without abnormal findings: Secondary | ICD-10-CM | POA: Diagnosis not present

## 2022-02-05 DIAGNOSIS — Z1329 Encounter for screening for other suspected endocrine disorder: Secondary | ICD-10-CM | POA: Diagnosis not present

## 2022-02-05 DIAGNOSIS — Z1389 Encounter for screening for other disorder: Secondary | ICD-10-CM | POA: Diagnosis not present

## 2022-03-09 DIAGNOSIS — I483 Typical atrial flutter: Secondary | ICD-10-CM | POA: Diagnosis not present

## 2022-03-10 LAB — CBC WITH DIFFERENTIAL/PLATELET
Basophils Absolute: 0 10*3/uL (ref 0.0–0.2)
Basos: 1 %
EOS (ABSOLUTE): 0.1 10*3/uL (ref 0.0–0.4)
Eos: 3 %
Hematocrit: 39 % (ref 37.5–51.0)
Hemoglobin: 13.5 g/dL (ref 13.0–17.7)
Immature Grans (Abs): 0 10*3/uL (ref 0.0–0.1)
Immature Granulocytes: 0 %
Lymphocytes Absolute: 1.1 10*3/uL (ref 0.7–3.1)
Lymphs: 40 %
MCH: 31 pg (ref 26.6–33.0)
MCHC: 34.6 g/dL (ref 31.5–35.7)
MCV: 89 fL (ref 79–97)
Monocytes Absolute: 0.3 10*3/uL (ref 0.1–0.9)
Monocytes: 12 %
Neutrophils Absolute: 1.2 10*3/uL — ABNORMAL LOW (ref 1.4–7.0)
Neutrophils: 44 %
Platelets: 182 10*3/uL (ref 150–450)
RBC: 4.36 x10E6/uL (ref 4.14–5.80)
RDW: 10.9 % — ABNORMAL LOW (ref 11.6–15.4)
WBC: 2.7 10*3/uL — ABNORMAL LOW (ref 3.4–10.8)

## 2022-03-10 LAB — BASIC METABOLIC PANEL
BUN/Creatinine Ratio: 12 (ref 10–24)
BUN: 10 mg/dL (ref 8–27)
CO2: 20 mmol/L (ref 20–29)
Calcium: 9.7 mg/dL (ref 8.6–10.2)
Chloride: 104 mmol/L (ref 96–106)
Creatinine, Ser: 0.85 mg/dL (ref 0.76–1.27)
Glucose: 76 mg/dL (ref 70–99)
Potassium: 4.1 mmol/L (ref 3.5–5.2)
Sodium: 140 mmol/L (ref 134–144)
eGFR: 94 mL/min/{1.73_m2} (ref >59)

## 2022-03-16 ENCOUNTER — Other Ambulatory Visit: Payer: Self-pay | Admitting: *Deleted

## 2022-03-16 DIAGNOSIS — R7989 Other specified abnormal findings of blood chemistry: Secondary | ICD-10-CM

## 2022-03-23 DIAGNOSIS — R7989 Other specified abnormal findings of blood chemistry: Secondary | ICD-10-CM | POA: Diagnosis not present

## 2022-03-23 LAB — CBC
Hematocrit: 41.2 % (ref 37.5–51.0)
Hemoglobin: 13.4 g/dL (ref 13.0–17.7)
MCH: 29.7 pg (ref 26.6–33.0)
MCHC: 32.5 g/dL (ref 31.5–35.7)
MCV: 91 fL (ref 79–97)
Platelets: 186 10*3/uL (ref 150–450)
RBC: 4.51 x10E6/uL (ref 4.14–5.80)
RDW: 11.3 % — ABNORMAL LOW (ref 11.6–15.4)
WBC: 3.6 10*3/uL (ref 3.4–10.8)

## 2022-03-24 ENCOUNTER — Encounter: Payer: Self-pay | Admitting: *Deleted

## 2022-03-26 ENCOUNTER — Encounter: Payer: Self-pay | Admitting: *Deleted

## 2022-05-04 ENCOUNTER — Encounter: Payer: Self-pay | Admitting: Emergency Medicine

## 2022-05-04 ENCOUNTER — Ambulatory Visit (INDEPENDENT_AMBULATORY_CARE_PROVIDER_SITE_OTHER): Payer: BC Managed Care – PPO | Admitting: Emergency Medicine

## 2022-05-04 VITALS — BP 108/62 | HR 83 | Temp 98.4°F | Ht 64.0 in | Wt 167.2 lb

## 2022-05-04 DIAGNOSIS — I059 Rheumatic mitral valve disease, unspecified: Secondary | ICD-10-CM

## 2022-05-04 DIAGNOSIS — I428 Other cardiomyopathies: Secondary | ICD-10-CM

## 2022-05-04 DIAGNOSIS — I700 Atherosclerosis of aorta: Secondary | ICD-10-CM | POA: Diagnosis not present

## 2022-05-04 DIAGNOSIS — Z8719 Personal history of other diseases of the digestive system: Secondary | ICD-10-CM

## 2022-05-04 DIAGNOSIS — K219 Gastro-esophageal reflux disease without esophagitis: Secondary | ICD-10-CM

## 2022-05-04 DIAGNOSIS — Z7689 Persons encountering health services in other specified circumstances: Secondary | ICD-10-CM

## 2022-05-04 NOTE — Progress Notes (Signed)
Bradley Hansen ?71 y.o. ? ? ?Chief Complaint  ?Patient presents with  ? New Patient (Initial Visit)  ?  No concerns   ? ? ?HISTORY OF PRESENT ILLNESS: ?This is a 71 y.o. male first visit to this office, here to establish care with me. ?History of nonischemic cardiomyopathy and mitral valve repair in the past. ?History of GERD and aortic atherosclerosis. ?Has no complaints or medical concerns today. ? ? ?HPI ? ? ?Prior to Admission medications   ?Not on File  ? ? ?No Known Allergies ? ?Patient Active Problem List  ? Diagnosis Date Noted  ? Educated about COVID-19 virus infection 09/08/2020  ? Nonischemic cardiomyopathy (HCC) 09/08/2020  ? Atrial flutter, post operative 06/04/2014  ? S/P minimally invasive mitral valve repair 04/26/2014  ? Mitral valve prolapse 03/26/2014  ? Mitral regurgitation   ? ATHEROSCLEROSIS OF AORTA 12/01/2010  ? BACK PAIN, CHRONIC 12/11/2009  ? MITRAL VALVE PROLAPSE 02/24/2007  ? GASTROESOPHAGEAL REFLUX, NO ESOPHAGITIS 02/24/2007  ? ? ?Past Medical History:  ?Diagnosis Date  ? Atrial flutter, post operative 06/04/2014  ? BACK PAIN, CHRONIC 12/11/2009  ? Qualifier: Diagnosis of  By: Mauricio Po MD, Fayrene Fearing    ? Fibromyalgia   ? GASTROESOPHAGEAL REFLUX, NO ESOPHAGITIS 02/24/2007  ? Qualifier: Diagnosis of  By: Abundio Miu    ? HEPATITIS C 02/24/2007  ? Qualifier: Diagnosis of  By: Abundio Miu    ? Hx of echocardiogram   ? Echo (04/2014):  EF 45-50%, diff HK, mild AI, MV repair ok (mean 5 mmHg), mod LAE, mild RAE, mild late TV systolic prolapse  ? Hypercholesteremia   ? Leukopenia   ? Mitral regurgitation   ? Mitral valve prolapse   ? TEE  08/2010 severe MR, bileaflet MVP, EF 60%  ? Prostadynia   ? Prostatitis   ? Rhinitis 2011  ? chronic   ? S/P minimally invasive mitral valve repair 04/26/2014  ? Complex valvuloplasty including artificial Gore-tex neocord placement x8, bovine pericardial patch augmentation of P3 portion of posterior leaflet and 36 mm Sorin Memo 3D ring annuloplasty via right mini  thoracotomy  ? Synovitis of hand 2011  ? right hand  ? ? ?Past Surgical History:  ?Procedure Laterality Date  ? APPENDECTOMY    ? CARDIOVERSION N/A 06/01/2014  ? Procedure: CARDIOVERSION;  Surgeon: [name], MD;  Location: [facility] ENDOSCOPY;  Service: Cardiovascular;  Laterality: N/A;  ? INTRAOPERATIVE TRANSESOPHAGEAL ECHOCARDIOGRAM N/A 04/26/2014  ? Procedure: INTRAOPERATIVE TRANSESOPHAGEAL ECHOCARDIOGRAM;  Surgeon: Purcell Nails, MD;  Location: [facility] OR;  Service: Open Heart Surgery;  Laterality: N/A;  ? LEFT AND RIGHT HEART CATHETERIZATION WITH CORONARY ANGIOGRAM N/A 02/19/2014  ? Procedure: LEFT AND RIGHT HEART CATHETERIZATION WITH CORONARY ANGIOGRAM;  Surgeon: Corky Crafts, MD;  Location: [facility] CATH LAB;  Service: Cardiovascular;  Laterality: N/A;  ? MITRAL VALVE REPAIR Right 04/26/2014  ? Procedure: MINIMALLY INVASIVE MITRAL VALVE REPAIR (MVR);  Surgeon: Purcell Nails, MD;  Location: [facility] OR;  Service: Open Heart Surgery;  Laterality: Right;  ? TEE WITHOUT CARDIOVERSION N/A 03/20/2013  ? Procedure: TRANSESOPHAGEAL ECHOCARDIOGRAM (TEE);  Surgeon: [name], MD;  Location: [facility] ENDOSCOPY;  Service: Cardiovascular;  Laterality: N/A;  ? ? ?Social History  ? ?Socioeconomic History  ? Marital status: Divorced  ?  Spouse name: Not on file  ? Number of children: Not on file  ? Years of education: Not on file  ? Highest education level: Not on file  ?Occupational History  ? Not on file  ?  Tobacco Use  ? Smoking status: Never  ? Smokeless tobacco: Never  ?Substance and Sexual Activity  ? Alcohol use: Yes  ?  Comment: week  ? Drug use: No  ? Sexual activity: Not on file  ?Other Topics Concern  ? Not on file  ?Social History Narrative  ? Not on file  ? ?Social Determinants of Health  ? ?Financial Resource Strain: Not on file  ?Food Insecurity: Not on file  ?Transportation Needs: Not on file  ?Physical Activity: Not on file  ?Stress: Not on file  ?Social Connections: Not on file  ?Intimate Partner Violence: Not on file   ? ? ?Family History  ?Problem Relation Age of Onset  ? Heart attack Neg Hx   ? Cancer Neg Hx   ? Stroke Neg Hx   ? ? ? ?Review of Systems  ?Constitutional: Negative.  Negative for chills and fever.  ?HENT: Negative.    ?Eyes: Negative.   ?Respiratory: Negative.    ?Cardiovascular: Negative.   ?Gastrointestinal: Negative.   ?Genitourinary: Negative.   ?Musculoskeletal: Negative.   ?Skin: Negative.   ?Neurological: Negative.   ?All other systems reviewed and are negative. ?Today's Vitals  ? 05/04/22 1516  ?BP: 108/62  ?Pulse: 83  ?Temp: 98.4 ?F (36.9 ?C)  ?TempSrc: Oral  ?SpO2: 97%  ?Weight: 167 lb 4 oz (75.9 kg)  ?Height: 5\' 4"  (1.626 m)  ? ?Body mass index is 28.71 kg/m?. ? ? ?Physical Exam ?Vitals reviewed.  ?Constitutional:   ?   Appearance: Normal appearance.  ?HENT:  ?   Head: Normocephalic.  ?   Mouth/Throat:  ?   Mouth: Mucous membranes are moist.  ?   Pharynx: Oropharynx is clear.  ?Eyes:  ?   Extraocular Movements: Extraocular movements intact.  ?   Conjunctiva/sclera: Conjunctivae normal.  ?   Pupils: Pupils are equal, round, and reactive to light.  ?Cardiovascular:  ?   Rate and Rhythm: Normal rate and regular rhythm.  ?   Pulses: Normal pulses.  ?   Heart sounds: Normal heart sounds.  ?Pulmonary:  ?   Effort: Pulmonary effort is normal.  ?   Breath sounds: Normal breath sounds.  ?Abdominal:  ?   Palpations: Abdomen is soft.  ?   Tenderness: There is no abdominal tenderness.  ?Musculoskeletal:     ?   General: Normal range of motion.  ?   Cervical back: No tenderness.  ?Lymphadenopathy:  ?   Cervical: No cervical adenopathy.  ?Skin: ?   General: Skin is warm and dry.  ?   Capillary Refill: Capillary refill takes less than 2 seconds.  ?Neurological:  ?   General: No focal deficit present.  ?   Mental Status: He is alert and oriented to person, place, and time.  ?Psychiatric:     ?   Mood and Affect: Mood normal.     ?   Behavior: Behavior normal.  ? ? ? ?ASSESSMENT & PLAN: ?A total of 55 minutes was spent  with the patient and counseling/coordination of care regarding preparing for this visit, establishing care with me, review of all available medical records, review of multiple chronic medical problems under management, review of all medications, review of most recent blood work results, education on nutrition, review of health maintenance items, prognosis, documentation and need for follow-up. ? ?Problem List Items Addressed This Visit   ? ?  ? Cardiovascular and Mediastinum  ? Mitral valve disease  ? ATHEROSCLEROSIS OF AORTA  ?  Stable.  Does not want to be on medications. ? ?  ?  ? Nonischemic cardiomyopathy (HCC) - Primary  ?  Stable.  Presently on no medications. ? ?  ?  ?  ? Digestive  ? GASTROESOPHAGEAL REFLUX, NO ESOPHAGITIS (Chronic)  ?  Stable.  On no medications. ? ?  ?  ? ?Other Visit Diagnoses   ? ? Encounter to establish care      ? History of gastroesophageal reflux (GERD)      ? ?  ? ?Patient Instructions  ?Health Maintenance After Age 10 ?After age 20, you are at a higher risk for certain long-term diseases and infections as well as injuries from falls. Falls are a major cause of broken bones and head injuries in people who are older than age 63. Getting regular preventive care can help to keep you healthy and well. Preventive care includes getting regular testing and making lifestyle changes as recommended by your health care provider. Talk with your health care provider about: ?Which screenings and tests you should have. A screening is a test that checks for a disease when you have no symptoms. ?A diet and exercise plan that is right for you. ?What should I know about screenings and tests to prevent falls? ?Screening and testing are the best ways to find a health problem early. Early diagnosis and treatment give you the best chance of managing medical conditions that are common after age 64. Certain conditions and lifestyle choices may make you more likely to have a fall. Your health care provider  may recommend: ?Regular vision checks. Poor vision and conditions such as cataracts can make you more likely to have a fall. If you wear glasses, make sure to get your prescription updated if your visi

## 2022-05-04 NOTE — Assessment & Plan Note (Signed)
Stable.  On no medications. 

## 2022-05-04 NOTE — Patient Instructions (Signed)
Health Maintenance After Age 69 After age 69, you are at a higher risk for certain long-term diseases and infections as well as injuries from falls. Falls are a major cause of broken bones and head injuries in people who are older than age 69. Getting regular preventive care can help to keep you healthy and well. Preventive care includes getting regular testing and making lifestyle changes as recommended by your health care provider. Talk with your health care provider about: Which screenings and tests you should have. A screening is a test that checks for a disease when you have no symptoms. A diet and exercise plan that is right for you. What should I know about screenings and tests to prevent falls? Screening and testing are the best ways to find a health problem early. Early diagnosis and treatment give you the best chance of managing medical conditions that are common after age 69. Certain conditions and lifestyle choices may make you more likely to have a fall. Your health care provider may recommend: Regular vision checks. Poor vision and conditions such as cataracts can make you more likely to have a fall. If you wear glasses, make sure to get your prescription updated if your vision changes. Medicine review. Work with your health care provider to regularly review all of the medicines you are taking, including over-the-counter medicines. Ask your health care provider about any side effects that may make you more likely to have a fall. Tell your health care provider if any medicines that you take make you feel dizzy or sleepy. Strength and balance checks. Your health care provider may recommend certain tests to check your strength and balance while standing, walking, or changing positions. Foot health exam. Foot pain and numbness, as well as not wearing proper footwear, can make you more likely to have a fall. Screenings, including: Osteoporosis screening. Osteoporosis is a condition that causes  the bones to get weaker and break more easily. Blood pressure screening. Blood pressure changes and medicines to control blood pressure can make you feel dizzy. Depression screening. You may be more likely to have a fall if you have a fear of falling, feel depressed, or feel unable to do activities that you used to do. Alcohol use screening. Using too much alcohol can affect your balance and may make you more likely to have a fall. Follow these instructions at home: Lifestyle Do not drink alcohol if: Your health care provider tells you not to drink. If you drink alcohol: Limit how much you have to: 0-1 drink a day for women. 0-2 drinks a day for men. Know how much alcohol is in your drink. In the [place] one drink equals one 12 oz bottle of beer (355 mL), one 5 oz glass of wine (148 mL), or one 1 oz glass of hard liquor (44 mL). Do not use any products that contain nicotine or tobacco. These products include cigarettes, chewing tobacco, and vaping devices, such as e-cigarettes. If you need help quitting, ask your health care provider. Activity  Follow a regular exercise program to stay fit. This will help you maintain your balance. Ask your health care provider what types of exercise are appropriate for you. If you need a cane or walker, use it as recommended by your health care provider. Wear supportive shoes that have nonskid soles. Safety  Remove any tripping hazards, such as rugs, cords, and clutter. Install safety equipment such as grab bars in bathrooms and safety rails on stairs. Keep rooms and walkways   well-lit. General instructions Talk with your health care provider about your risks for falling. Tell your health care provider if: You fall. Be sure to tell your health care provider about all falls, even ones that seem minor. You feel dizzy, tiredness (fatigue), or off-balance. Take over-the-counter and prescription medicines only as told by your health care provider. These include  supplements. Eat a healthy diet and maintain a healthy weight. A healthy diet includes low-fat dairy products, low-fat (lean) meats, and fiber from whole grains, beans, and lots of fruits and vegetables. Stay current with your vaccines. Schedule regular health, dental, and eye exams. Summary Having a healthy lifestyle and getting preventive care can help to protect your health and wellness after age 69. Screening and testing are the best way to find a health problem early and help you avoid having a fall. Early diagnosis and treatment give you the best chance for managing medical conditions that are more common for people who are older than age 69. Falls are a major cause of broken bones and head injuries in people who are older than age 69. Take precautions to prevent a fall at home. Work with your health care provider to learn what changes you can make to improve your health and wellness and to prevent falls. This information is not intended to replace advice given to you by your health care provider. Make sure you discuss any questions you have with your health care provider. Document Revised: 05/05/2021 Document Reviewed: 05/05/2021 Elsevier Patient Education  2023 Elsevier Inc.  

## 2022-05-04 NOTE — Assessment & Plan Note (Signed)
Stable.  Does not want to be on medications. ?

## 2022-05-04 NOTE — Assessment & Plan Note (Signed)
Stable.  Presently on no medications. ?

## 2022-05-26 DIAGNOSIS — R351 Nocturia: Secondary | ICD-10-CM | POA: Diagnosis not present

## 2022-05-26 DIAGNOSIS — N401 Enlarged prostate with lower urinary tract symptoms: Secondary | ICD-10-CM | POA: Diagnosis not present

## 2022-06-01 DIAGNOSIS — R300 Dysuria: Secondary | ICD-10-CM | POA: Diagnosis not present

## 2022-06-01 DIAGNOSIS — N401 Enlarged prostate with lower urinary tract symptoms: Secondary | ICD-10-CM | POA: Diagnosis not present

## 2022-06-22 DIAGNOSIS — K5730 Diverticulosis of large intestine without perforation or abscess without bleeding: Secondary | ICD-10-CM | POA: Diagnosis not present

## 2022-06-22 DIAGNOSIS — Z1211 Encounter for screening for malignant neoplasm of colon: Secondary | ICD-10-CM | POA: Diagnosis not present

## 2022-06-22 DIAGNOSIS — D122 Benign neoplasm of ascending colon: Secondary | ICD-10-CM | POA: Diagnosis not present

## 2022-06-22 DIAGNOSIS — K635 Polyp of colon: Secondary | ICD-10-CM | POA: Diagnosis not present

## 2022-12-08 DIAGNOSIS — R030 Elevated blood-pressure reading, without diagnosis of hypertension: Secondary | ICD-10-CM | POA: Diagnosis not present

## 2023-01-09 DIAGNOSIS — R42 Dizziness and giddiness: Secondary | ICD-10-CM | POA: Diagnosis not present

## 2023-01-09 DIAGNOSIS — R030 Elevated blood-pressure reading, without diagnosis of hypertension: Secondary | ICD-10-CM | POA: Diagnosis not present

## 2023-01-13 DIAGNOSIS — I440 Atrioventricular block, first degree: Secondary | ICD-10-CM | POA: Diagnosis not present

## 2023-02-09 DIAGNOSIS — Z131 Encounter for screening for diabetes mellitus: Secondary | ICD-10-CM | POA: Diagnosis not present

## 2023-02-09 DIAGNOSIS — Z0000 Encounter for general adult medical examination without abnormal findings: Secondary | ICD-10-CM | POA: Diagnosis not present

## 2023-02-09 DIAGNOSIS — Z125 Encounter for screening for malignant neoplasm of prostate: Secondary | ICD-10-CM | POA: Diagnosis not present

## 2023-03-23 DIAGNOSIS — B182 Chronic viral hepatitis C: Secondary | ICD-10-CM | POA: Diagnosis not present

## 2023-05-21 DIAGNOSIS — R519 Headache, unspecified: Secondary | ICD-10-CM | POA: Diagnosis not present

## 2023-05-21 DIAGNOSIS — M542 Cervicalgia: Secondary | ICD-10-CM | POA: Diagnosis not present

## 2023-05-21 DIAGNOSIS — G44209 Tension-type headache, unspecified, not intractable: Secondary | ICD-10-CM | POA: Diagnosis not present

## 2023-06-25 ENCOUNTER — Telehealth: Payer: Self-pay | Admitting: Radiology

## 2023-06-25 NOTE — Telephone Encounter (Signed)
Contacted Rudi Coco to schedule their annual wellness visit. Call back at later date: 11/27/23  Laquenta Whitsell K. CMA

## 2023-07-15 ENCOUNTER — Emergency Department (HOSPITAL_COMMUNITY): Payer: No Typology Code available for payment source

## 2023-07-15 ENCOUNTER — Encounter (HOSPITAL_COMMUNITY): Payer: Self-pay

## 2023-07-15 ENCOUNTER — Emergency Department (HOSPITAL_COMMUNITY)
Admission: EM | Admit: 2023-07-15 | Discharge: 2023-07-16 | Disposition: A | Discharge status: Home / Self Care | Payer: No Typology Code available for payment source | Attending: Emergency Medicine | Admitting: Emergency Medicine

## 2023-07-15 DIAGNOSIS — Y92410 Unspecified street and highway as the place of occurrence of the external cause: Secondary | ICD-10-CM | POA: Diagnosis not present

## 2023-07-15 DIAGNOSIS — R0789 Other chest pain: Secondary | ICD-10-CM | POA: Insufficient documentation

## 2023-07-15 DIAGNOSIS — R519 Headache, unspecified: Secondary | ICD-10-CM | POA: Diagnosis not present

## 2023-07-15 DIAGNOSIS — M542 Cervicalgia: Secondary | ICD-10-CM | POA: Diagnosis not present

## 2023-07-15 LAB — CBC WITH DIFFERENTIAL/PLATELET
Abs Immature Granulocytes: 0.01 10*3/uL (ref 0.00–0.07)
Basophils Absolute: 0 10*3/uL (ref 0.0–0.1)
Basophils Relative: 0 %
Eosinophils Absolute: 0.1 10*3/uL (ref 0.0–0.5)
Eosinophils Relative: 2 %
HCT: 41.2 % (ref 39.0–52.0)
Hemoglobin: 13.2 g/dL (ref 13.0–17.0)
Immature Granulocytes: 0 %
Lymphocytes Relative: 35 %
Lymphs Abs: 1.1 10*3/uL (ref 0.7–4.0)
MCH: 31.1 pg (ref 26.0–34.0)
MCHC: 32 g/dL (ref 30.0–36.0)
MCV: 96.9 fL (ref 80.0–100.0)
Monocytes Absolute: 0.3 10*3/uL (ref 0.1–1.0)
Monocytes Relative: 11 %
Neutro Abs: 1.6 10*3/uL — ABNORMAL LOW (ref 1.7–7.7)
Neutrophils Relative %: 52 %
Platelets: 170 10*3/uL (ref 150–400)
RBC: 4.25 MIL/uL (ref 4.22–5.81)
RDW: 11.6 % (ref 11.5–15.5)
WBC: 3.1 10*3/uL — ABNORMAL LOW (ref 4.0–10.5)
nRBC: 0 % (ref 0.0–0.2)

## 2023-07-15 LAB — BASIC METABOLIC PANEL
Anion gap: 8 (ref 5–15)
BUN: 14 mg/dL (ref 8–23)
CO2: 27 mmol/L (ref 22–32)
Calcium: 9.2 mg/dL (ref 8.9–10.3)
Chloride: 103 mmol/L (ref 98–111)
Creatinine, Ser: 0.73 mg/dL (ref 0.61–1.24)
GFR, Estimated: >60 mL/min (ref >60)
Glucose, Bld: 96 mg/dL (ref 70–99)
Potassium: 3.8 mmol/L (ref 3.5–5.1)
Sodium: 138 mmol/L (ref 135–145)
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
## 2023-07-16 MED ORDER — IOHEXOL 350 MG/ML SOLN
75.0000 mL | Freq: Once | INTRAVENOUS | Status: AC | PRN
  Administered 2023-07-16: 75 mL via INTRAVENOUS

## 2023-07-16 MED ORDER — OXYCODONE-ACETAMINOPHEN 5-325 MG PO TABS
2.0000 | ORAL_TABLET | Freq: Once | ORAL | Status: AC
  Administered 2023-07-16: 2 via ORAL

## 2023-07-16 MED ORDER — OXYCODONE-ACETAMINOPHEN 5-325 MG PO TABS
ORAL_TABLET | ORAL | Status: AC
  Filled 2023-07-16: qty 1

## 2023-09-16 DIAGNOSIS — I10 Essential (primary) hypertension: Secondary | ICD-10-CM | POA: Insufficient documentation

## 2023-09-16 NOTE — Progress Notes (Deleted)
Cardiology Office Note:   Date:  09/16/2023  ID:  Bradley Hansen, DOB November 28, 1951, MRN [id number] PCP: Aviva Kluver  [facility] Providers Cardiologist:  Rollene Rotunda, MD {  History of Present Illness:   Bradley Hansen is a 72 y.o. male  who presents for followup of minimally invasive mitral valve repair by Dr. Cornelius Moras. He had postoperative atrial fibrillation and DCCV.  He was on amiodarone. This was discontinued secondary to bradycardia. However, at followup with Dr. Cornelius Moras he was noted to be back in atrial flutter. He was again placed on amiodarone.  He converted on this back into sinus.  However follow up EKG demonstrated a markedly abnormal EKG with QT prolongation and T wave inversion.  I stopped the amiodarone.  Follow up EKG demonstrated resolution of these changes.   He has a mildly reduced EF but stable repair on echo in Apirl 2017 and again in 2019 and then again in 2022.     Since he was last seen ***     he has done well.  He does go to the gym occasionally.  He works out on the treadmill.  He does not get any chest pressure, neck or arm discomfort.  He does not report any shortness of breath, PND or orthopnea.  He has had no new palpitations, presyncope or syncope.  He has had no weight gain or edema.    ROS: ***  Studies Reviewed:    EKG:       ***  Risk Assessment/Calculations:   {Does this patient have ATRIAL FIBRILLATION?:[phone]} No BP recorded.  {Refresh Note OR Click here to enter BP  :1}***        Physical Exam:   VS:  There were no vitals taken for this visit.   Wt Readings from Last 3 Encounters:  05/04/22 167 lb 4 oz (75.9 kg)  01/19/22 168 lb 3.2 oz (76.3 kg)  09/09/20 158 lb 6.4 oz (71.8 kg)     GEN: Well nourished, well developed in no acute distress NECK: No JVD; No carotid bruits CARDIAC: ***RRR, no murmurs, rubs, gallops RESPIRATORY:  Clear to auscultation without rales, wheezing or rhonchi  ABDOMEN: Soft, non-tender, non-distended EXTREMITIES:   No edema; No deformity   ASSESSMENT AND PLAN:   ATRIAL FLUTTER:   ***     He has had no symptomatic recurrence of this.  No change in therapy.       MITRAL VALVE REPAIR:  ***   This was stable on echo last year.  I will follow this clinically.  No change in therapy.   MILDLY REDUCED EF:   EF was ***  in the low normal range last year.  There is mild to moderate concentric left ventricular hypertrophy.  I will follow this clinically.   HTN: Blood pressure is ***  mildly elevated and he is going to get an [employer] blood pressure cuff.    {Are you ordering a CV Procedure (e.g. stress test, cath, DCCV, TEE, etc)?   Press F2        :[id number]}  Follow up ***  Signed, Rollene Rotunda, MD

## 2023-09-17 ENCOUNTER — Ambulatory Visit: Payer: No Typology Code available for payment source | Admitting: Cardiology

## 2023-09-17 DIAGNOSIS — I4892 Unspecified atrial flutter: Secondary | ICD-10-CM

## 2023-09-17 DIAGNOSIS — I059 Rheumatic mitral valve disease, unspecified: Secondary | ICD-10-CM

## 2023-09-17 DIAGNOSIS — I10 Essential (primary) hypertension: Secondary | ICD-10-CM

## 2024-01-14 DIAGNOSIS — I700 Atherosclerosis of aorta: Secondary | ICD-10-CM | POA: Diagnosis not present

## 2024-01-14 DIAGNOSIS — Z0000 Encounter for general adult medical examination without abnormal findings: Secondary | ICD-10-CM | POA: Diagnosis not present

## 2024-01-14 DIAGNOSIS — I119 Hypertensive heart disease without heart failure: Secondary | ICD-10-CM | POA: Diagnosis not present

## 2024-01-14 DIAGNOSIS — I509 Heart failure, unspecified: Secondary | ICD-10-CM | POA: Diagnosis not present

## 2024-01-14 DIAGNOSIS — I2510 Atherosclerotic heart disease of native coronary artery without angina pectoris: Secondary | ICD-10-CM | POA: Diagnosis not present

## 2024-01-14 DIAGNOSIS — R011 Cardiac murmur, unspecified: Secondary | ICD-10-CM | POA: Diagnosis not present

## 2024-01-14 DIAGNOSIS — Z79899 Other long term (current) drug therapy: Secondary | ICD-10-CM | POA: Diagnosis not present

## 2024-01-14 DIAGNOSIS — H18413 Arcus senilis, bilateral: Secondary | ICD-10-CM | POA: Diagnosis not present

## 2024-01-18 ENCOUNTER — Encounter (HOSPITAL_COMMUNITY): Payer: Self-pay

## 2024-01-18 ENCOUNTER — Ambulatory Visit (HOSPITAL_COMMUNITY)
Admission: EM | Admit: 2024-01-18 | Discharge: 2024-01-18 | Disposition: A | Discharge status: Home / Self Care | Payer: Medicare Other

## 2024-01-18 ENCOUNTER — Emergency Department (HOSPITAL_COMMUNITY)
Admission: EM | Admit: 2024-01-18 | Discharge: 2024-01-19 | Disposition: A | Discharge status: Home / Self Care | Payer: Medicare Other | Attending: Emergency Medicine | Admitting: Emergency Medicine

## 2024-01-18 DIAGNOSIS — I6782 Cerebral ischemia: Secondary | ICD-10-CM | POA: Diagnosis not present

## 2024-01-18 DIAGNOSIS — Z72820 Sleep deprivation: Secondary | ICD-10-CM | POA: Insufficient documentation

## 2024-01-18 DIAGNOSIS — R4182 Altered mental status, unspecified: Secondary | ICD-10-CM

## 2024-01-18 DIAGNOSIS — I672 Cerebral atherosclerosis: Secondary | ICD-10-CM | POA: Diagnosis not present

## 2024-01-18 DIAGNOSIS — R519 Headache, unspecified: Secondary | ICD-10-CM | POA: Insufficient documentation

## 2024-01-18 DIAGNOSIS — G479 Sleep disorder, unspecified: Secondary | ICD-10-CM | POA: Diagnosis not present

## 2024-01-18 DIAGNOSIS — R410 Disorientation, unspecified: Secondary | ICD-10-CM

## 2024-01-18 LAB — CBC
HCT: 39.4 % (ref 39.0–52.0)
Hemoglobin: 13.1 g/dL (ref 13.0–17.0)
MCH: 31 pg (ref 26.0–34.0)
MCHC: 33.2 g/dL (ref 30.0–36.0)
MCV: 93.1 fL (ref 80.0–100.0)
Platelets: 199 10*3/uL (ref 150–400)
RBC: 4.23 MIL/uL (ref 4.22–5.81)
RDW: 11.4 % — ABNORMAL LOW (ref 11.5–15.5)
WBC: 3.6 10*3/uL — ABNORMAL LOW (ref 4.0–10.5)
nRBC: 0 % (ref 0.0–0.2)

## 2024-01-18 LAB — BASIC METABOLIC PANEL
Anion gap: 9 (ref 5–15)
BUN: 12 mg/dL (ref 8–23)
CO2: 27 mmol/L (ref 22–32)
Calcium: 9.5 mg/dL (ref 8.9–10.3)
Chloride: 98 mmol/L (ref 98–111)
Creatinine, Ser: 0.97 mg/dL (ref 0.61–1.24)
GFR, Estimated: >60 mL/min (ref >60)
Glucose, Bld: 98 mg/dL (ref 70–99)
Potassium: 3.6 mmol/L (ref 3.5–5.1)
Sodium: 134 mmol/L — ABNORMAL LOW (ref 135–145)

## 2024-01-18 LAB — POCT FASTING CBG KUC MANUAL ENTRY: POCT Glucose (KUC): 94 mg/dL (ref 70–99)

## 2024-01-18 NOTE — ED Provider Notes (Signed)
Patient is accompanied by his wife who helped provide the majority of history.  Reports for the past 2 days he has had difficulty sleeping and increased confusion.  He has been having trouble answering her questions and finding words as well as leaving the sink on and not following through with tasks.  He does report an ongoing headache and reports that he has a history of headaches but this is worse than normal and he does report this is the worst headache of his life.  Denies any nausea or vomiting.  He is oriented to person and place but was unable to tell me the month though he was correct about the year.  He does not have a history of dementia and his wife reports that this is new and not his typical behavior.  His blood sugar was 94.  Discussed that given his altered mental status the safest thing to do is to go to the emergency room since we do not have stat labs or imaging capabilities.  Wife is agreeable and will take him directly to [facility], ER.  Declined EMS transport.   Jeani Hawking, PA-C 01/18/24 1827

## 2024-01-18 NOTE — ED Triage Notes (Signed)
Trouble sleeping and slight confusion. Patient having a hard time following simple directions. Ongoing for 2 days. No known accidents or falls. Ongoing headache, no vision changes, anxiety, dizzy, and light headed. Having trouble remembering things.

## 2024-01-18 NOTE — ED Notes (Signed)
Patient is being discharged from the Urgent Care and sent to the Emergency Department via personal opperated vehicle . Per Dorann Ou PA, patient is in need of higher level of care due to altered mental status. Patient is aware and verbalizes understanding of plan of care.  Vitals:   01/18/24 1810  BP: (!) 122/93  Pulse: 65  Resp: 18  Temp: 98.9 F (37.2 C)  SpO2: 100%

## 2024-01-18 NOTE — Discharge Instructions (Signed)
Please go directly to the [facility], ER

## 2024-01-18 NOTE — ED Triage Notes (Signed)
Urgent care note:  Trouble sleeping and slight confusion. Patient having a hard time following simple directions. Ongoing for 2 days. No known accidents or falls. Ongoing headache, no vision changes, anxiety, dizzy, and light headed. Having trouble remembering things.   Pt is coming in from the request of UC

## 2024-01-19 ENCOUNTER — Emergency Department (HOSPITAL_COMMUNITY): Payer: Medicare Other

## 2024-01-19 DIAGNOSIS — R519 Headache, unspecified: Secondary | ICD-10-CM | POA: Diagnosis not present

## 2024-01-19 DIAGNOSIS — I6782 Cerebral ischemia: Secondary | ICD-10-CM | POA: Diagnosis not present

## 2024-01-19 DIAGNOSIS — I672 Cerebral atherosclerosis: Secondary | ICD-10-CM | POA: Diagnosis not present

## 2024-01-19 LAB — HEPATIC FUNCTION PANEL
ALT: 24 U/L (ref 0–44)
AST: 20 U/L (ref 15–41)
Albumin: 3.7 g/dL (ref 3.5–5.0)
Alkaline Phosphatase: 51 U/L (ref 38–126)
Bilirubin, Direct: 0.2 mg/dL (ref 0.0–0.2)
Indirect Bilirubin: 1 mg/dL — ABNORMAL HIGH (ref 0.3–0.9)
Total Bilirubin: 1.2 mg/dL (ref 0.0–1.2)
Total Protein: 7.4 g/dL (ref 6.5–8.1)

## 2024-01-19 LAB — URINALYSIS, ROUTINE W REFLEX MICROSCOPIC
Bilirubin Urine: NEGATIVE
Glucose, UA: NEGATIVE mg/dL
Hgb urine dipstick: NEGATIVE
Ketones, ur: NEGATIVE mg/dL
Leukocytes,Ua: NEGATIVE
Nitrite: NEGATIVE
Protein, ur: NEGATIVE mg/dL
Specific Gravity, Urine: 1.012 (ref 1.005–1.030)
pH: 5 (ref 5.0–8.0)

## 2024-01-19 LAB — AMMONIA: Ammonia: 39 umol/L — ABNORMAL HIGH (ref 9–35)

## 2024-01-19 NOTE — ED Provider Notes (Signed)
[facility] DEPART[facility] AT [facility] Provider Note   CSN: [id number] Arrival date & time: 01/18/24  4010     History  Chief Complaint  Patient presents with   Headache    Bradley Hansen is a 73 y.o. male.  The history is provided by the patient and medical records.  Headache  73 year old male with history of hepatitis C, fibromyalgia, hypercholesterolemia, atrial flutter, presenting to the ED with headache.  Reportedly for the past 2 days he has had ongoing headache along the top of his head, sometimes radiates to the right and sometimes toward the left.  He did wake up and vomited early yesterday morning around 2am.  No other vomiting aside from that.  He has had some trouble focusing recently, trouble completing tasks because he forgets what he is doing, etc.  He has not had any frank confusion per wife.  He has not had any numbness or weakness of the arms or legs.  He has not had any difficulty walking.  No falls or head trauma.  Patient reports he has not really slept in about 2 days because his head is hurting.  Seen at urgent care yesterday evening and sent here for further evaluation.  Home Medications Prior to Admission medications   Medication Sig Start Date End Date Taking? Authorizing Provider  lisinopril (ZESTRIL) 2.5 MG tablet Take 2.5 mg by mouth daily.    [provider]      Allergies    Patient has no known allergies.    Review of Systems   Review of Systems  Neurological:  Positive for headaches.  All other systems reviewed and are negative.   Physical Exam Updated Vital Signs BP 134/87   Pulse 70   Temp 98.3 F (36.8 C)   Resp 16   SpO2 100%   Physical Exam Vitals and nursing note reviewed.  Constitutional:      Appearance: He is well-developed.  HENT:     Head: Normocephalic and atraumatic.     Comments: No visible head trauma Eyes:     Conjunctiva/sclera: Conjunctivae normal.     Pupils: Pupils are equal, round,  and reactive to light.  Cardiovascular:     Rate and Rhythm: Normal rate and regular rhythm.     Heart sounds: Normal heart sounds.  Pulmonary:     Effort: Pulmonary effort is normal.     Breath sounds: Normal breath sounds.  Abdominal:     General: Bowel sounds are normal.     Palpations: Abdomen is soft.  Musculoskeletal:        General: Normal range of motion.     Cervical back: Normal range of motion.  Skin:    General: Skin is warm and dry.  Neurological:     Mental Status: He is alert and oriented to person, place, and time.     Comments: AAOx3, answering questions and following commands appropriately; equal strength UE and LE bilaterally; CN grossly intact; moves all extremities appropriately without ataxia; no focal neuro deficits or facial asymmetry appreciated     ED Results / Procedures / Treatments   Labs (all labs ordered are listed, but only abnormal results are displayed) Labs Reviewed  BASIC METABOLIC PANEL - Abnormal; Notable for the following components:      Result Value   Sodium 134 (*)    All other components within normal limits  CBC - Abnormal; Notable for the following components:   WBC 3.6 (*)  RDW 11.4 (*)    All other components within normal limits  HEPATIC FUNCTION PANEL - Abnormal; Notable for the following components:   Indirect Bilirubin 1.0 (*)    All other components within normal limits  AMMONIA - Abnormal; Notable for the following components:   Ammonia 39 (*)    All other components within normal limits  URINALYSIS, ROUTINE W REFLEX MICROSCOPIC  CBG MONITORING, ED    EKG None  Radiology CT HEAD WO CONTRAST ( ) Result Date: 01/19/2024 CLINICAL DATA:  New onset headache. Trouble sleeping and slight confusion. Hard time following directions. EXAM: CT HEAD WITHOUT CONTRAST TECHNIQUE: Contiguous axial images were obtained from the base of the skull through the vertex without intravenous contrast. RADIATION DOSE REDUCTION: This exam  was performed according to the departmental dose-optimization program which includes automated exposure control, adjustment of the mA and/or kV according to patient size and/or use of iterative reconstruction technique. COMPARISON:  07/16/2023 FINDINGS: Brain: No intracranial hemorrhage, mass effect, or evidence of acute infarct. No hydrocephalus. No extra-axial fluid collection. Age-commensurate cerebral atrophy and chronic small vessel ischemic disease. Vascular: No hyperdense vessel. Intracranial arterial calcification. Skull: No fracture or focal lesion. Sinuses/Orbits: No acute finding. Other: None. IMPRESSION: No acute intracranial abnormality. Electronically Signed   By: Minerva Fester M.D.   On: 01/19/2024 02:55    Procedures Procedures    Medications Ordered in ED Medications - No data to display  ED Course/ Medical Decision Making/ A&P                                 Medical Decision Making Amount and/or Complexity of Data Reviewed Labs: ordered. Radiology: ordered and independent interpretation performed. ECG/medicine tests: ordered and independent interpretation performed.   73 year old male presenting to the ED from urgent care due to headache and trouble sleeping.  Wife reports he is sometimes forgetful when performing tasks but has not been overtly confused.  He is awake, alert, oriented here.  He is able to give adequate history.  He does not have any focal neurologic deficits.  No falls or head trauma.  Screening labs were sent and are grossly reassuring without leukocytosis or electrolyte derangement.  Ammonia is only minimally elevated, not enough to cause significant cognitive impairment.  UA without signs of infection.  CT head is negative for any acute findings.  Patient was able to fall asleep here without medications or other intervention.  Family reassured by work-up.  Stable for discharge.  Can follow-up with PCP.  Return here for new concerns.  Final Clinical  Impression(s) / ED Diagnoses Final diagnoses:  Acute nonintractable headache, unspecified headache type  Trouble in sleeping    Rx / DC Orders ED Discharge Orders     None         Garlon Hatchet, PA-C 01/19/24 0549    Melene Plan, DO 01/19/24 4098

## 2024-01-19 NOTE — Discharge Instructions (Addendum)
CT head did not show any acute findings.  Labs were normal. Can try over the counter melatonin to help with sleep if needed.  Tylenol or motrin for headache. Follow-up with your doctor. Return here for new concerns.

## 2024-01-19 NOTE — ED Notes (Signed)
Patient discharged home. VSS. Patient ambulatory out of treatment area with clean and steady gait. All questions answered at time of discharge. Belongings sent home with patient.

## 2024-01-25 DIAGNOSIS — I2510 Atherosclerotic heart disease of native coronary artery without angina pectoris: Secondary | ICD-10-CM | POA: Diagnosis not present

## 2024-01-25 DIAGNOSIS — R7989 Other specified abnormal findings of blood chemistry: Secondary | ICD-10-CM | POA: Diagnosis not present

## 2024-01-25 DIAGNOSIS — Z0001 Encounter for general adult medical examination with abnormal findings: Secondary | ICD-10-CM | POA: Diagnosis not present

## 2024-01-25 DIAGNOSIS — I119 Hypertensive heart disease without heart failure: Secondary | ICD-10-CM | POA: Diagnosis not present

## 2024-01-25 DIAGNOSIS — H18413 Arcus senilis, bilateral: Secondary | ICD-10-CM | POA: Diagnosis not present

## 2024-01-25 DIAGNOSIS — I509 Heart failure, unspecified: Secondary | ICD-10-CM | POA: Diagnosis not present

## 2024-01-25 DIAGNOSIS — I25118 Atherosclerotic heart disease of native coronary artery with other forms of angina pectoris: Secondary | ICD-10-CM | POA: Diagnosis not present

## 2024-01-25 DIAGNOSIS — K089 Disorder of teeth and supporting structures, unspecified: Secondary | ICD-10-CM | POA: Diagnosis not present

## 2024-01-25 DIAGNOSIS — H18419 Arcus senilis, unspecified eye: Secondary | ICD-10-CM | POA: Diagnosis not present

## 2024-01-25 DIAGNOSIS — I10 Essential (primary) hypertension: Secondary | ICD-10-CM | POA: Diagnosis not present

## 2024-01-31 ENCOUNTER — Telehealth: Payer: Self-pay

## 2024-01-31 ENCOUNTER — Other Ambulatory Visit (HOSPITAL_COMMUNITY): Payer: Self-pay

## 2024-01-31 NOTE — Telephone Encounter (Signed)
[facility] Patient Advocate Encounter  Insurance verification completed.    The patient is insured through [employer] MEDICAID. Patient has Medicare and is not eligible for a copay card, but may be able to apply for patient assistance or Medicare RX Payment Plan (Patient Must reach out to their plan, if eligible for payment plan), if available.    Ran test claim for MAVYRET  Medication will need a PA.  Ran test claim for EPCL[place]  Medication will need a PA.(NON-FORMULARY)  We will continue to follow to see if copay assistance is needed.  This test claim was processed through [facility]- copay amounts may vary at other pharmacies due to pharmacy/plan contracts, or as the patient moves through the different stages of their insurance plan.

## 2024-02-07 ENCOUNTER — Other Ambulatory Visit: Payer: Self-pay

## 2024-02-07 ENCOUNTER — Ambulatory Visit (INDEPENDENT_AMBULATORY_CARE_PROVIDER_SITE_OTHER): Payer: Medicare Other | Admitting: Internal Medicine

## 2024-02-07 ENCOUNTER — Encounter: Payer: Self-pay | Admitting: Internal Medicine

## 2024-02-07 VITALS — BP 188/88 | HR 77 | Temp 98.4°F | Ht 64.0 in | Wt 173.0 lb

## 2024-02-07 DIAGNOSIS — B182 Chronic viral hepatitis C: Secondary | ICD-10-CM

## 2024-02-07 DIAGNOSIS — Z1159 Encounter for screening for other viral diseases: Secondary | ICD-10-CM

## 2024-02-07 NOTE — Patient Instructions (Addendum)
 Please get blood test and liver ultrasound elastography to help determine treatment   Please do a video visit with me in around 6 weeks to discuss result and treatment   I'll also get our pharmacy team involved on follow up to help you arrange treatment   ----------- Please do not share any personal equipments (shaver, tooth brush, nail clippers) Please advise your wife to get tested too

## 2024-02-07 NOTE — Progress Notes (Signed)
 [facility]  Reason for Consult:chronic hep c Referring Provider: Vevelyn Gowers, NP [facility])    Patient Active Problem List   Diagnosis Date Noted   Essential hypertension 09/16/2023   Educated about COVID-19 virus infection 09/08/2020   Nonischemic cardiomyopathy (HCC) 09/08/2020   Atrial flutter, post operative 06/04/2014   S/P minimally invasive mitral valve repair 04/26/2014   Mitral valve prolapse 03/26/2014   Mitral regurgitation    ATHEROSCLEROSIS OF AORTA 12/01/2010   Backache 12/11/2009   Mitral valve disease 02/24/2007   GASTROESOPHAGEAL REFLUX, NO ESOPHAGITIS 02/24/2007      HPI: Bradley Hansen is a 73 y.o. male with GERD, nonischemic cardiomyopathy (02/2021 echo 50% ef, mild-mod concentric lvh), hx mvp and MR s/p minimally invasive mitral valve repair 2015, referred here by his pcp for chronic hep c management   I reviewed outside record and discussed with patient: Patient originally from [place]; [place]  [place] years ago 01/14/24 pcp visit Patient denied previous treatment  01/14/24 labs: Cr 0.84; ast 18; alt 21; alkphos 61; tbil 1.0 Cbc 3/13.7/197 Hep b sAg nonreactive Hcv rna 77.5k; hcv ab positive  Per epic review, patient was dxed at least since 2004: Hep c gt2a dx'ed 2004; seen by GI once in 2006 but lost to follow up  He was also seen at [facility] on 02/2023 for hep c treatment evaluation -- at that time no ascites, jaundice, encephaltopathy, hematemesis, melena/hematochezia. Colonoscopy 06/22/2022 diverticulosis and a subcentimeter tubular adenoma. He lost to follow up then too     Risk: No hx ivdu, indu, medical field job, kidney/dialysis issue, blood transfusion, tatoos. He doesn't know of anyone who had hep c and that he grew up with  He lives with his wife. He doesn't know if his wife is positive   Cirrhosis finding: no hx n/v/hematemesis, bloody/black stool, abd  distension, fluid withdrawal from abd/lungs, hx confusion, fatigue, edema, weight loss, poor apetite  Extra gi manifestation: no abnormal skin rash, fatigue, diffuse joint pain/swelling, hx kidney disease   Minimal etoh use   We reviewed: Natural history of hep c and its complications available treatment options for hepatitis C Other factors potentially worsening liver disease, including alcohol use; obesity; diabetes mellitus, and viral coinfection We discussed potential medications that can contribute to liver inflammation like acetaminophen , and to avoiding excessive amount (more than 3 gram daily use) acetaminophen     Potential for ddi/med list review No acid blocker He does take crestor - he never had heart attack/stroke previously   No personal plan for any surgery     Review of Systems: ROS All other ros negative      Past Medical History:  Diagnosis Date   Atrial flutter, post operative 06/04/2014   BACK PAIN, CHRONIC 12/11/2009   Qualifier: Diagnosis of  By: Erving Heather MD, James     Fibromyalgia    GASTROESOPHAGEAL REFLUX, NO ESOPHAGITIS 02/24/2007   Qualifier: Diagnosis of  By: Gae Jointer     HEPATITIS C 02/24/2007   Qualifier: Diagnosis of  By: Gae Jointer     Hx of echocardiogram    Echo (04/2014):  EF 45-50%, diff HK, mild AI, MV repair ok (mean 5 mmHg), mod LAE, mild RAE, mild late TV systolic prolapse   Hypercholesteremia    Leukopenia    Mitral regurgitation    Mitral valve prolapse    TEE  08/2010 severe MR, bileaflet MVP, EF  60%   Prostadynia    Prostatitis    Rhinitis 2011   chronic    S/P minimally invasive mitral valve repair 04/26/2014   Complex valvuloplasty including artificial Gore-tex neocord placement x8, bovine pericardial patch augmentation of P3 portion of posterior leaflet and 36 mm Sorin Memo 3D ring annuloplasty via right mini thoracotomy   Synovitis of hand 2011   right hand    Social History   Tobacco Use   Smoking  status: Never   Smokeless tobacco: Never  Substance Use Topics   Alcohol use: Yes    Comment: week   Drug use: No    Family History  Problem Relation Age of Onset   Heart attack Neg Hx    Cancer Neg Hx    Stroke Neg Hx     No Known Allergies  OBJECTIVE: Vitals:   02/07/24 0917  BP: (!) 188/88  Pulse: 77  Temp: 98.4 F (36.9 C)  TempSrc: Oral  Weight: 173 lb (78.5 kg)  Height: 5\' 4"  (1.626 m)   Body mass index is 29.7 kg/m.   Physical Exam General/constitutional: no distress, pleasant HEENT: Normocephalic, PER, Conj Clear, EOMI, Oropharynx clear Neck supple CV: rrr no mrg Lungs: clear to auscultation, normal respiratory effort Abd: Soft, Nontender Ext: no edema Skin: No Rash Neuro: nonfocal MSK: no peripheral joint swelling/tenderness/warmth; back spines nontender   Lab: See above   Microbiology:  Serology:  Imaging:   Assessment/plan: Problem List Items Addressed This Visit   None Visit Diagnoses       Chronic hepatitis C without hepatic coma (HCC)    -  Primary   Relevant Orders   Hepatitis C genotype   US  ABDOMEN COMPLETE W/ELASTOGRAPHY     Need for hepatitis B screening test       Relevant Orders   Hepatitis B surface antibody,quantitative   Hepatitis B Surface AntiGEN   Hepatitis B Core Antibody, total         #chronic hep c Dx'ed 2004  Prior treatment: none GT: 2 Evidence of cirrhosis: not by exam/fib4 Interested in treatment yes Potential DDI: crestor (we can hold or adjust dose as needed)   -Labs: hep b  -Imaging: elastography -follow up: 6 weeks video visit -Meds planned: epclusa with decreased dose of crestor; likely 12 weeks   -discussed natural progression of hep c, transmission (avoid sharing personal hygiene equipment) -discussed avoid toxin like etoh and excessive acetamaminphen (no more than 2 gram a day) -discussed healthy life style and good glucose control -discussed avoiding eating raw sea  food -discussed we can treat hep c but can be reinfected -discussed hepatitis coinfection and vaccination       Follow-up: Return in about 6 weeks (around 03/20/2024).  Jamesetta Mcbride, MD [facility] 02/07/2024, 9:34 AM

## 2024-02-10 ENCOUNTER — Ambulatory Visit (HOSPITAL_COMMUNITY): Admission: RE | Admit: 2024-02-10 | Payer: Medicare Other | Source: Ambulatory Visit

## 2024-02-11 LAB — HEPATITIS B SURFACE ANTIGEN: Hepatitis B Surface Ag: NONREACTIVE

## 2024-02-11 LAB — HEPATITIS B CORE ANTIBODY, TOTAL: Hep B Core Total Ab: REACTIVE — AB

## 2024-02-11 LAB — HEPATITIS B SURFACE ANTIBODY, QUANTITATIVE: Hep B S AB Quant (Post): 295 m[IU]/mL (ref >=10)

## 2024-02-11 LAB — HEPATITIS C GENOTYPE: HCV Genotype: 2

## 2024-03-13 ENCOUNTER — Ambulatory Visit (HOSPITAL_COMMUNITY)
Admission: RE | Admit: 2024-03-13 | Discharge: 2024-03-13 | Disposition: A | Discharge status: Home / Self Care | Source: Ambulatory Visit | Attending: Internal Medicine | Admitting: Internal Medicine

## 2024-03-13 DIAGNOSIS — B182 Chronic viral hepatitis C: Secondary | ICD-10-CM | POA: Diagnosis present

## 2024-03-20 ENCOUNTER — Other Ambulatory Visit (HOSPITAL_COMMUNITY): Payer: Self-pay

## 2024-03-21 ENCOUNTER — Ambulatory Visit: Payer: Medicare Other | Admitting: Internal Medicine

## 2024-03-21 ENCOUNTER — Ambulatory Visit: Admitting: Internal Medicine

## 2024-03-21 ENCOUNTER — Telehealth: Payer: Self-pay

## 2024-03-21 ENCOUNTER — Other Ambulatory Visit: Payer: Self-pay

## 2024-03-21 ENCOUNTER — Other Ambulatory Visit

## 2024-03-21 ENCOUNTER — Other Ambulatory Visit (HOSPITAL_COMMUNITY): Payer: Self-pay

## 2024-03-21 VITALS — BP 187/101 | HR 80 | Temp 98.0°F | Resp 16 | Wt 171.0 lb

## 2024-03-21 DIAGNOSIS — B182 Chronic viral hepatitis C: Secondary | ICD-10-CM | POA: Diagnosis not present

## 2024-03-21 NOTE — Patient Instructions (Signed)
 Our pharmacy team will reach out to you once your insurance approve mavyret for 8 weeks   Then our pharmacy team will likely see you once a month to monitor while you are taking medication   They'll review you medication list and adjust your chronic medication as needed to reduce risk of drug interaction: No acid blocker while taking mavyret Crestor will need to be dose reduced to no more than 10 mg a day     At least 3 months after you finish your mavyret, you can see me again to test for virologic cure of hep c   Thank you

## 2024-03-21 NOTE — Progress Notes (Signed)
 [facility]  Reason for Consult:chronic hep c Referring Provider: Cristino Martes, NP [facility])    Patient Active Problem List   Diagnosis Date Noted   Essential hypertension 09/16/2023   Educated about COVID-19 virus infection 09/08/2020   Nonischemic cardiomyopathy (HCC) 09/08/2020   Atrial flutter, post operative 06/04/2014   S/P minimally invasive mitral valve repair 04/26/2014   Mitral valve prolapse 03/26/2014   Mitral regurgitation    ATHEROSCLEROSIS OF AORTA 12/01/2010   Backache 12/11/2009   Mitral valve disease 02/24/2007   GASTROESOPHAGEAL REFLUX, NO ESOPHAGITIS 02/24/2007      HPI: Bradley Hansen is a 73 y.o. male with GERD, nonischemic cardiomyopathy (02/2021 echo 50% ef, mild-mod concentric lvh), hx mvp and MR s/p minimally invasive mitral valve repair 2015, referred here by his pcp for chronic hep c management   I reviewed outside record and discussed with patient: Patient originally from [place]; immigrated to [place] 40 years ago 01/14/24 pcp visit Patient denied previous treatment  01/14/24 labs: Cr 0.84; ast 18; alt 21; alkphos 61; tbil 1.0 Cbc 3/13.7/197 Hep b sAg nonreactive Hcv rna 77.5k; hcv ab positive  Per epic review, patient was dxed at least since 2004: Hep c gt2a dx'ed 2004; seen by GI once in 2006 but lost to follow up  He was also seen at [facility] on 02/2023 for hep c treatment evaluation -- at that time no ascites, jaundice, encephaltopathy, hematemesis, melena/hematochezia. Colonoscopy 06/22/2022 diverticulosis and a subcentimeter tubular adenoma. He lost to follow up then too     Risk: No hx ivdu, indu, medical field job, kidney/dialysis issue, blood transfusion, tatoos. He doesn't know of anyone who had hep c and that he grew up with  He lives with his wife. He doesn't know if his wife is positive   Cirrhosis finding: no hx n/v/hematemesis, bloody/black stool, abd  distension, fluid withdrawal from abd/lungs, hx confusion, fatigue, edema, weight loss, poor apetite  Extra gi manifestation: no abnormal skin rash, fatigue, diffuse joint pain/swelling, hx kidney disease   Minimal etoh use   We reviewed: Natural history of hep c and its complications available treatment options for hepatitis C Other factors potentially worsening liver disease, including alcohol use; obesity; diabetes mellitus, and viral coinfection We discussed potential medications that can contribute to liver inflammation like acetaminophen, and to avoiding excessive amount (more than 3 gram daily use) acetaminophen    Potential for ddi/med list review No acid blocker He does take crestor - he never had heart attack/stroke previously   No personal plan for any surgery  ------------ 03/21/24 id clinic f/u Patient had elastography done a week ago but not read yet I have spoken with our pharmacy team, plan to do mavyret 8 weeks for noncirrhotic patient No complaint today   Review of Systems: ROS All other ros negative      Past Medical History:  Diagnosis Date   Atrial flutter, post operative 06/04/2014   BACK PAIN, CHRONIC 12/11/2009   Qualifier: Diagnosis of  By: Mauricio Po MD, James     Fibromyalgia    GASTROESOPHAGEAL REFLUX, NO ESOPHAGITIS 02/24/2007   Qualifier: Diagnosis of  By: Abundio Miu     HEPATITIS C 02/24/2007   Qualifier: Diagnosis of  By: Abundio Miu     Hx of echocardiogram    Echo (04/2014):  EF 45-50%, diff HK, mild AI, MV repair ok (mean 5 mmHg), mod LAE, mild RAE,  mild late TV systolic prolapse   Hypercholesteremia    Leukopenia    Mitral regurgitation    Mitral valve prolapse    TEE  08/2010 severe MR, bileaflet MVP, EF 60%   Prostadynia    Prostatitis    Rhinitis 2011   chronic    S/P minimally invasive mitral valve repair 04/26/2014   Complex valvuloplasty including artificial Gore-tex neocord placement x8, bovine pericardial patch  augmentation of P3 portion of posterior leaflet and 36 mm Sorin Memo 3D ring annuloplasty via right mini thoracotomy   Synovitis of hand 2011   right hand    Social History   Tobacco Use   Smoking status: Never   Smokeless tobacco: Never  Substance Use Topics   Alcohol use: Yes    Comment: week   Drug use: No    Family History  Problem Relation Age of Onset   Heart attack Neg Hx    Cancer Neg Hx    Stroke Neg Hx     No Known Allergies  OBJECTIVE: Vitals:   03/21/24 1128  BP: (!) 187/101  Pulse: 80  Resp: 16  Temp: 98 F (36.7 C)  TempSrc: Oral  SpO2: 99%  Weight: 171 lb (77.6 kg)   Body mass index is 29.35 kg/m.   Physical Exam General/constitutional: no distress, pleasant HEENT: Normocephalic, PER, Conj Clear, EOMI, Oropharynx clear Neck supple CV: rrr no mrg Lungs: clear to auscultation, normal respiratory effort Abd: Soft, Nontender Ext: no edema Skin: No Rash Neuro: nonfocal MSK: no peripheral joint swelling/tenderness/warmth; back spines nontender   Lab: Lab Results  Component Value Date   WBC 3.6 (L) 01/18/2024   HGB 13.1 01/18/2024   HCT 39.4 01/18/2024   MCV 93.1 01/18/2024   PLT 199 01/18/2024   Last metabolic panel Lab Results  Component Value Date   GLUCOSE 98 01/18/2024   NA 134 (L) 01/18/2024   K 3.6 01/18/2024   CL 98 01/18/2024   CO2 27 01/18/2024   BUN 12 01/18/2024   CREATININE 0.97 01/18/2024   GFRNONAA >60 01/18/2024   CALCIUM 9.5 01/18/2024   PROT 7.4 01/19/2024   ALBUMIN 3.7 01/19/2024   BILITOT 1.2 01/19/2024   ALKPHOS 51 01/19/2024   AST 20 01/19/2024   ALT 24 01/19/2024   ANIONGAP 9 01/18/2024    FIB 4 was just 1.46 (above 1.45 cut off for lack of advance fibrosis)  Microbiology:  Serology:  Imaging: Reviewed  03/13/24 elastography Pending read    09/2019 [place] elastography liver only IMPRESSION: ULTRASOUND LIVER:   Unremarkable.   ULTRASOUND HEPATIC ELASTOGRAPHY:   Median kPa:  3.9    Diagnostic category:  High probability of being normal.   The use of hepatic elastography is applicable to patients with viral hepatitis and non-alcoholic fatty liver disease. At this time, there is insufficient data for the referenced cut-off values and use in other causes of liver disease, including alcoholic liver disease. Patients, however, may be assessed by elastography and serve as their own reference standard/baseline.   In patients with non-alcoholic liver disease, the values suggesting compensated advanced chronic liver disease (cACLD) may be lower, and patients may need additional testing with elasticity results of 7-9 kPa.  Assessment/plan: Problem List Items Addressed This Visit   None Visit Diagnoses       Chronic hepatitis C with hepatic coma (HCC)    -  Primary          #chronic hep c Dx'ed 2004  Prior treatment: none GT:  2 Evidence of cirrhosis: not by exam/fib4 (1.46); awaiting elastography but likely no advance fibrosis (2020 elastography doesn't suggest advance fibrosis Interested in treatment yes Potential DDI: crestor (we can hold or adjust dose as needed) --> will need to make sure crestor dosing is 10 mg max daily Hepatitis b 01/2024 testing showed prior hep b infection, functionally cured   -Imaging: elastography result pending -follow up: pharmacy will start [place] authorization and start mavyret; I'll see no sooner than 3 months for hep c treatment virologic cure testing -Meds planned: 8 weeks mavyret planned reduced crestor dose to 5-10 mg daily while on treatment. He is not on any acid blocker or other medication with significant potential ddi    -discussed natural progression of hep c, transmission (avoid sharing personal hygiene equipment) -discussed avoid toxin like etoh and excessive acetamaminphen (no more than 2 gram a day) -discussed healthy life style and good glucose control -discussed avoiding eating raw sea food -discussed we can  treat hep c but can be reinfected -discussed hepatitis coinfection and vaccination       Follow-up: No follow-ups on file.  Raymondo Band, MD [facility] 03/21/2024, 11:32 AM

## 2024-03-21 NOTE — Telephone Encounter (Addendum)
 Received notification from [facility] regarding a prior authorization for Mavyret. Authorization has been APPROVED from 03/21/24 to 05/16/24.   Per test claim, copay for 28 days supply is $1965.03  Patient can fill through [facility]: [phone]   Authorization # [id number]  There are no funds available for Hep-C once available I will reach out to the patient to get income information.

## 2024-03-21 NOTE — Telephone Encounter (Signed)
 Submitted a Prior Authorization request to [employer] for Mavyret via CoverMyMeds. Will update once we receive a response.  PA ID: [id number]

## 2024-03-21 NOTE — Telephone Encounter (Signed)
 Called patient due to missed appointment this morning - left voicemail asking patient to return my call to reschedule.    Jovi Zavadil Lesli Albee, CMA

## 2024-04-13 ENCOUNTER — Ambulatory Visit: Admitting: Internal Medicine

## 2024-06-08 ENCOUNTER — Telehealth: Payer: Self-pay

## 2024-06-08 ENCOUNTER — Other Ambulatory Visit (HOSPITAL_COMMUNITY): Payer: Self-pay

## 2024-06-08 NOTE — Telephone Encounter (Signed)
 Received call from Vevelyn Gowers, NP with [facility]. She states Bradley Hansen never got his Hep C medication and was wondering if someone from our office could call him to walk him through the process. He also needs a follow up appointment.   Bradley Hansen, BSN, RN

## 2024-06-09 ENCOUNTER — Telehealth: Payer: Self-pay

## 2024-06-09 ENCOUNTER — Other Ambulatory Visit (HOSPITAL_COMMUNITY): Payer: Self-pay

## 2024-06-09 NOTE — Telephone Encounter (Signed)
 Submitted a Prior Authorization request to [facility] for MAVYRET via CoverMyMeds. Will update once we receive a response.    PA ID: [id number]

## 2024-06-12 ENCOUNTER — Telehealth: Payer: Self-pay

## 2024-06-12 ENCOUNTER — Other Ambulatory Visit (HOSPITAL_COMMUNITY): Payer: Self-pay

## 2024-06-12 ENCOUNTER — Other Ambulatory Visit: Payer: Self-pay | Admitting: Pharmacist

## 2024-06-12 DIAGNOSIS — B182 Chronic viral hepatitis C: Secondary | ICD-10-CM

## 2024-06-12 MED ORDER — MAVYRET 100-40 MG PO TABS
3.0000 | ORAL_TABLET | Freq: Every day | ORAL | 1 refills | Status: AC
Start: 2024-06-12 — End: ?
  Filled 2024-06-14: qty 84, 28d supply, fill #0
  Filled 2024-07-07 – 2024-07-13 (×2): qty 84, 28d supply, fill #1

## 2024-06-12 NOTE — Telephone Encounter (Signed)
 Rx sent.

## 2024-06-12 NOTE — Telephone Encounter (Signed)
 Received notification from [facility] regarding a prior authorization for MAVYRET. Authorization has been APPROVED from 06/09/24 to 08/01/24.   Per test claim, copay for 28 days supply is $1965.03  Patient can fill through [facility]: [phone]   Authorization #  [id number]  I will keep searching for funds to help pay for the medication.

## 2024-06-13 ENCOUNTER — Other Ambulatory Visit (HOSPITAL_COMMUNITY): Payer: Self-pay

## 2024-06-13 ENCOUNTER — Telehealth: Payer: Self-pay

## 2024-06-13 NOTE — Telephone Encounter (Signed)
 [facility] Patient Advocate Encounter  I was successful in securing patient a $10,000.00 grant from [employer] to provide copayment coverage for [employer].  This will make the out of pocket expense $0.00.     [employer] ID: [id number]  I have spoken with the patient..     The billing information is as follows and has been shared with [facility].    RxBin: [id number] PCN: [id number] Member ID: [id number] Group ID: [id number] Dates of Eligibility: 05/13/24 through 05/12/25  Patient knows to call the office with questions or concerns.  Roylene Corn, CPhT Specialty Pharmacy Patient [facility] Phone: [phone] Fax:  [phone]

## 2024-06-14 ENCOUNTER — Other Ambulatory Visit: Payer: Self-pay

## 2024-06-14 ENCOUNTER — Other Ambulatory Visit (HOSPITAL_COMMUNITY): Payer: Self-pay

## 2024-06-14 NOTE — Progress Notes (Signed)
 Specialty Pharmacy Initial Fill Coordination Note  Bradley Hansen is a 73 y.o. male contacted today regarding initial fill of specialty medication(s) Glecaprevir-Pibrentasvir [facility])   Patient requested Delivery   Delivery date: 06/16/24   Verified address: Patient address [address]  [place] [address]   Medication will be filled on 06/15/24.   Patient is aware of 0.00 copayment.

## 2024-06-15 ENCOUNTER — Other Ambulatory Visit: Payer: Self-pay | Admitting: Pharmacist

## 2024-06-15 ENCOUNTER — Other Ambulatory Visit: Payer: Self-pay

## 2024-06-15 ENCOUNTER — Telehealth: Payer: Self-pay

## 2024-06-15 DIAGNOSIS — B182 Chronic viral hepatitis C: Secondary | ICD-10-CM

## 2024-06-15 MED ORDER — ROSUVASTATIN CALCIUM 10 MG PO TABS
10.0000 mg | ORAL_TABLET | Freq: Every day | ORAL | 1 refills | Status: AC
Start: 2024-06-15 — End: ?

## 2024-06-15 NOTE — Progress Notes (Signed)
 Specialty Pharmacy Initiation Note   Bradley Hansen is a 73 y.o. male who will be followed by the specialty pharmacy service for RxSp Hepatitis C    Review of administration, indication, effectiveness, safety, potential side effects, storage/disposable, and missed dose instructions occurred today for patient's specialty medication(s) Glecaprevir-Pibrentasvir [facility])     Patient/Caregiver did not have any additional questions or concerns.   Patient's therapy is appropriate to: Initiate    Goals Addressed             This Visit's Progress    Achieve virologic cure as evidenced by SVR       Patient is initiating therapy. Patient will be evaluated at upcoming provider appointment to assess progress      Comply with lab assessments       Patient is initiating therapy. Patient will adhere to provider and/or lab appointments      Maintain optimal adherence to therapy       Patient is initiating therapy. Patient will be evaluated at upcoming provider appointment to assess progress         Sonya Duster Specialty Pharmacist

## 2024-06-15 NOTE — Telephone Encounter (Signed)
 Spoke with Mr. Fayette to counsel on Mavyret therapy for HCV treatment. He understands to take 3 tablets by mouth AT ONCE with food daily for 8 weeks. Stressed adherence to medication to help reduce viral load and clear virus. He was also informed of the drug-drug interaction with Rosuvastatin 20 mg. He was instructed to STOP Rosuvastatin 20 mg and hold Rosuvastatin until he has the new 10 mg dose on hand. He agreed to this plan.   He will follow up with pharmacy on 07/22   Tolu Kewon Statler, PharmD [facility] PGY-1

## 2024-06-22 ENCOUNTER — Other Ambulatory Visit (HOSPITAL_COMMUNITY): Payer: Self-pay

## 2024-06-29 ENCOUNTER — Other Ambulatory Visit: Payer: Self-pay

## 2024-07-05 ENCOUNTER — Other Ambulatory Visit: Payer: Self-pay

## 2024-07-07 ENCOUNTER — Other Ambulatory Visit: Payer: Self-pay

## 2024-07-10 ENCOUNTER — Other Ambulatory Visit: Payer: Self-pay

## 2024-07-13 ENCOUNTER — Other Ambulatory Visit: Payer: Self-pay

## 2024-07-13 ENCOUNTER — Other Ambulatory Visit (HOSPITAL_COMMUNITY): Payer: Self-pay

## 2024-07-13 NOTE — Progress Notes (Signed)
 Specialty Pharmacy Refill Coordination Note  Bradley Hansen is a 73 y.o. male assessed today regarding refills of clinic administered specialty medication(s) Glecaprevir -Pibrentasvir  (Mavyret )   Clinic requested Courier to Provider Office   Delivery date: 07/17/24   Verified address: [address] [place] [address]   Medication will be filled on 07/14/24.

## 2024-07-14 ENCOUNTER — Other Ambulatory Visit: Payer: Self-pay

## 2024-07-17 ENCOUNTER — Telehealth: Payer: Self-pay

## 2024-07-17 NOTE — Progress Notes (Deleted)
 HPI: Bradley Hansen is a 73 y.o. male who presents to the [facility] pharmacy clinic for Hepatitis C follow-up.  Medication: Mavyret  x 8 weeks  Start Date: 06/19/2024  Hepatitis C Genotype: 2  Fibrosis Score: F3  Hepatitis C RNA: 98,300 (03/03/2023)  Patient Active Problem List   Diagnosis Date Noted   Essential hypertension 09/16/2023   Educated about COVID-19 virus infection 09/08/2020   Nonischemic cardiomyopathy (HCC) 09/08/2020   Atrial flutter, post operative 06/04/2014   S/P minimally invasive mitral valve repair 04/26/2014   Mitral valve prolapse 03/26/2014   Mitral regurgitation    ATHEROSCLEROSIS OF AORTA 12/01/2010   Backache 12/11/2009   Mitral valve disease 02/24/2007   GASTROESOPHAGEAL REFLUX, NO ESOPHAGITIS 02/24/2007    Patient's Medications  New Prescriptions   No medications on file  Previous Medications   ASPIRIN  EC 81 MG TABLET    Take 81 mg by mouth once.   GLECAPREVIR -PIBRENTASVIR  (MAVYRET ) 100-40 MG TABS    Take 3 tablets by mouth daily with breakfast.   LISINOPRIL (ZESTRIL) 2.5 MG TABLET    Take 2.5 mg by mouth daily.   MECLIZINE (ANTIVERT) 12.5 MG TABLET    Take 12.5 mg by mouth 3 (three) times daily.   ROSUVASTATIN  (CRESTOR ) 10 MG TABLET    Take 1 tablet (10 mg total) by mouth daily.   TIZANIDINE (ZANAFLEX) 4 MG TABLET    Take 4 mg by mouth 3 (three) times daily.  Modified Medications   No medications on file  Discontinued Medications   No medications on file    Allergies: No Known Allergies  Past Medical History: Past Medical History:  Diagnosis Date   Atrial flutter, post operative 06/04/2014   BACK PAIN, CHRONIC 12/11/2009   Qualifier: Diagnosis of  By: Sharlet MD, James     Fibromyalgia    GASTROESOPHAGEAL REFLUX, NO ESOPHAGITIS 02/24/2007   Qualifier: Diagnosis of  By: Sharron Railing     HEPATITIS C 02/24/2007   Qualifier: Diagnosis of  By: Sharron Railing     Hx of echocardiogram    Echo (04/2014):  EF 45-50%, diff HK, mild AI, MV  repair ok (mean 5 mmHg), mod LAE, mild RAE, mild late TV systolic prolapse   Hypercholesteremia    Leukopenia    Mitral regurgitation    Mitral valve prolapse    TEE  08/2010 severe MR, bileaflet MVP, EF 60%   Prostadynia    Prostatitis    Rhinitis 2011   chronic    S/P minimally invasive mitral valve repair 04/26/2014   Complex valvuloplasty including artificial Gore-tex neocord placement x8, bovine pericardial patch augmentation of P3 portion of posterior leaflet and 36 mm Sorin Memo 3D ring annuloplasty via right mini thoracotomy   Synovitis of hand 2011   right hand    Social History: Social History   Socioeconomic History   Marital status: Married    Spouse name: Not on file   Number of children: Not on file   Years of education: Not on file   Highest education level: Not on file  Occupational History   Not on file  Tobacco Use   Smoking status: Never   Smokeless tobacco: Never  Substance and Sexual Activity   Alcohol use: Yes    Comment: week   Drug use: No   Sexual activity: Not on file  Other Topics Concern   Not on file  Social History Narrative   Not on file   Social Drivers of Health   Financial  Resource Strain: Not on file  Food Insecurity: Low Risk  (12/08/2022)   Received from [facility]   Hunger Vital Sign    Within the past 12 months, you worried that your food would run out before you got money to buy more: Never true    Within the past 12 months, the food you bought just didn't last and you didn't have money to get more: Not on file  Transportation Needs: No Transportation Needs (12/08/2022)   Received from [facility] visits prior to 02/27/2023.   Transportation    In the past 12 months, has lack of reliable transportation kept you from medical appointments, meetings, work or from getting things needed for daily living?: No  Physical Activity: Not on file  Stress: Not on file  Social Connections: Not on file     Labs: Hepatitis C Lab Results  Component Value Date   HCVGENOTYPE 2 02/07/2024   Hepatitis B Lab Results  Component Value Date   HEPBSAG NON-REACTIVE 02/07/2024   HEPBCAB REACTIVE (A) 02/07/2024   Hepatitis A No results found for: HAV HIV No results found for: HIV Lab Results  Component Value Date   CREATININE 0.97 01/18/2024   CREATININE 0.73 07/15/2023   CREATININE 0.85 03/09/2022   CREATININE 0.86 11/19/2015   CREATININE 0.9 06/18/2014   Lab Results  Component Value Date   AST 20 01/19/2024   AST 23 06/18/2014   AST 20 04/23/2014   ALT 24 01/19/2024   ALT 24 06/18/2014   ALT 18 04/23/2014   INR 2 08/26/2015   INR 2.8 07/29/2015   INR 1.6 07/08/2015    Assessment: Bradley Hansen presents to clinic today for HCV follow-up. He has been taking Mavyret  for 4 weeks and has not missed any doses. Has been tolerating the medication well. Will check HCV RNA today and follow-up in 4 weeks. Provided patient with refill in office today. Confirms he is taking Crestor  10 mg dose while on Mavyret .  Eligible for PCV20 and Shingles vaccines.   Plan: - Check HCV RNA - Continue Mavyret  - Follow up with me on ***  Alan Geralds, PharmD, CPP, BCIDP, AAHIVP Clinical Pharmacist Practitioner Infectious Diseases Clinical Pharmacist [facility] 07/17/2024, 1:57 PM

## 2024-07-17 NOTE — Telephone Encounter (Signed)
 [facility] Patient Advocate Encounter  Patient's medications Mavyret  have been couriered to [facility] from [facility] and will be  picked up at the patients appointment on 07/18/24.  2nd Mavyret  box.  Arland Hutchinson, CPhT [facility] Phone: [phone] Fax:  [phone]

## 2024-07-18 ENCOUNTER — Ambulatory Visit: Admitting: Pharmacist

## 2024-07-18 DIAGNOSIS — B182 Chronic viral hepatitis C: Secondary | ICD-10-CM

## 2024-07-19 ENCOUNTER — Ambulatory Visit (INDEPENDENT_AMBULATORY_CARE_PROVIDER_SITE_OTHER): Admitting: Pharmacist

## 2024-07-19 ENCOUNTER — Other Ambulatory Visit: Payer: Self-pay

## 2024-07-19 DIAGNOSIS — Z23 Encounter for immunization: Secondary | ICD-10-CM | POA: Diagnosis not present

## 2024-07-19 DIAGNOSIS — B182 Chronic viral hepatitis C: Secondary | ICD-10-CM

## 2024-07-19 NOTE — Progress Notes (Signed)
 HPI: Bradley Hansen is a 73 y.o. male who presents to the [facility] pharmacy clinic for Hepatitis C follow-up.  Medication: Mavyret  x 8 weeks  Start Date: 06/19/2024  Hepatitis C Genotype: 2  Fibrosis Score: F3  Hepatitis C RNA: 98,300 (03/03/2023)  Patient Active Problem List   Diagnosis Date Noted   Essential hypertension 09/16/2023   Educated about COVID-19 virus infection 09/08/2020   Nonischemic cardiomyopathy (HCC) 09/08/2020   Atrial flutter, post operative 06/04/2014   S/P minimally invasive mitral valve repair 04/26/2014   Mitral valve prolapse 03/26/2014   Mitral regurgitation    ATHEROSCLEROSIS OF AORTA 12/01/2010   Backache 12/11/2009   Mitral valve disease 02/24/2007   GASTROESOPHAGEAL REFLUX, NO ESOPHAGITIS 02/24/2007    Patient's Medications  New Prescriptions   No medications on file  Previous Medications   ASPIRIN  EC 81 MG TABLET    Take 81 mg by mouth once.   GLECAPREVIR -PIBRENTASVIR  (MAVYRET ) 100-40 MG TABS    Take 3 tablets by mouth daily with breakfast.   LISINOPRIL (ZESTRIL) 2.5 MG TABLET    Take 2.5 mg by mouth daily.   MECLIZINE (ANTIVERT) 12.5 MG TABLET    Take 12.5 mg by mouth 3 (three) times daily.   ROSUVASTATIN  (CRESTOR ) 10 MG TABLET    Take 1 tablet (10 mg total) by mouth daily.   TIZANIDINE (ZANAFLEX) 4 MG TABLET    Take 4 mg by mouth 3 (three) times daily.  Modified Medications   No medications on file  Discontinued Medications   No medications on file    Allergies: No Known Allergies  Past Medical History: Past Medical History:  Diagnosis Date   Atrial flutter, post operative 06/04/2014   BACK PAIN, CHRONIC 12/11/2009   Qualifier: Diagnosis of  By: Sharlet MD, James     Fibromyalgia    GASTROESOPHAGEAL REFLUX, NO ESOPHAGITIS 02/24/2007   Qualifier: Diagnosis of  By: Sharron Railing     HEPATITIS C 02/24/2007   Qualifier: Diagnosis of  By: Sharron Railing     Hx of echocardiogram    Echo (04/2014):  EF 45-50%, diff HK, mild AI, MV  repair ok (mean 5 mmHg), mod LAE, mild RAE, mild late TV systolic prolapse   Hypercholesteremia    Leukopenia    Mitral regurgitation    Mitral valve prolapse    TEE  08/2010 severe MR, bileaflet MVP, EF 60%   Prostadynia    Prostatitis    Rhinitis 2011   chronic    S/P minimally invasive mitral valve repair 04/26/2014   Complex valvuloplasty including artificial Gore-tex neocord placement x8, bovine pericardial patch augmentation of P3 portion of posterior leaflet and 36 mm Sorin Memo 3D ring annuloplasty via right mini thoracotomy   Synovitis of hand 2011   right hand    Social History: Social History   Socioeconomic History   Marital status: Married    Spouse name: Not on file   Number of children: Not on file   Years of education: Not on file   Highest education level: Not on file  Occupational History   Not on file  Tobacco Use   Smoking status: Never   Smokeless tobacco: Never  Substance and Sexual Activity   Alcohol use: Yes    Comment: week   Drug use: No   Sexual activity: Not on file  Other Topics Concern   Not on file  Social History Narrative   Not on file   Social Drivers of Health   Financial  Resource Strain: Not on file  Food Insecurity: Low Risk  (12/08/2022)   Received from [facility]   Hunger Vital Sign    Within the past 12 months, you worried that your food would run out before you got money to buy more: Never true    Within the past 12 months, the food you bought just didn't last and you didn't have money to get more: Not on file  Transportation Needs: No Transportation Needs (12/08/2022)   Received from [facility] visits prior to 02/27/2023.   Transportation    In the past 12 months, has lack of reliable transportation kept you from medical appointments, meetings, work or from getting things needed for daily living?: No  Physical Activity: Not on file  Stress: Not on file  Social Connections: Not on file     Labs: Hepatitis C Lab Results  Component Value Date   HCVGENOTYPE 2 02/07/2024   Hepatitis B Lab Results  Component Value Date   HEPBSAG NON-REACTIVE 02/07/2024   HEPBCAB REACTIVE (A) 02/07/2024   Hepatitis A No results found for: HAV HIV No results found for: HIV Lab Results  Component Value Date   CREATININE 0.97 01/18/2024   CREATININE 0.73 07/15/2023   CREATININE 0.85 03/09/2022   CREATININE 0.86 11/19/2015   CREATININE 0.9 06/18/2014   Lab Results  Component Value Date   AST 20 01/19/2024   AST 23 06/18/2014   AST 20 04/23/2014   ALT 24 01/19/2024   ALT 24 06/18/2014   ALT 18 04/23/2014   INR 2 08/26/2015   INR 2.8 07/29/2015   INR 1.6 07/08/2015    Assessment: Bradley Hansen presents to clinic today for HCV follow-up. He has been taking Mavyret  for 4 weeks and has not missed any doses. Has been tolerating the medication well. Will check HCV RNA today and follow-up in 4 weeks. Provided patient with refill in office today. He denies starting Crestor  10 mg daily as he is wanted to wait until he completes Mavyret .   Eligible for PCV20 and Shingles vaccines; accepts PCV20 vaccine today.    Plan: - Check HCV RNA - Continue Mavyret  x 4 weeks  - Administer PCV20 vaccine - Follow up with me on 08/24/24  Alan Geralds, PharmD, CPP, BCIDP, AAHIVP Clinical Pharmacist Practitioner Infectious Diseases Clinical Pharmacist [facility] 07/19/2024, 11:34 AM

## 2024-07-21 LAB — HEPATITIS C RNA QUANTITATIVE
HCV Quantitative Log: <1.18 {Log_IU}/mL
HCV RNA, PCR, QN: <15 [IU]/mL

## 2024-08-01 ENCOUNTER — Other Ambulatory Visit: Payer: Self-pay | Admitting: Pharmacy Technician

## 2024-08-01 ENCOUNTER — Other Ambulatory Visit: Payer: Self-pay

## 2024-08-04 ENCOUNTER — Other Ambulatory Visit: Payer: Self-pay

## 2024-08-04 ENCOUNTER — Emergency Department (HOSPITAL_COMMUNITY)

## 2024-08-04 ENCOUNTER — Inpatient Hospital Stay (HOSPITAL_COMMUNITY)
Admission: EM | Admit: 2024-08-04 | Discharge: 2024-08-10 | DRG: 070 | Disposition: A | Discharge status: Home Health | Attending: Internal Medicine | Admitting: Internal Medicine

## 2024-08-04 DIAGNOSIS — R4182 Altered mental status, unspecified: Principal | ICD-10-CM | POA: Diagnosis present

## 2024-08-04 DIAGNOSIS — R4701 Aphasia: Secondary | ICD-10-CM | POA: Diagnosis not present

## 2024-08-04 DIAGNOSIS — G9340 Encephalopathy, unspecified: Secondary | ICD-10-CM | POA: Diagnosis present

## 2024-08-04 DIAGNOSIS — G9341 Metabolic encephalopathy: Secondary | ICD-10-CM | POA: Diagnosis not present

## 2024-08-04 DIAGNOSIS — E871 Hypo-osmolality and hyponatremia: Secondary | ICD-10-CM | POA: Diagnosis present

## 2024-08-04 DIAGNOSIS — E785 Hyperlipidemia, unspecified: Secondary | ICD-10-CM | POA: Diagnosis present

## 2024-08-04 DIAGNOSIS — G8191 Hemiplegia, unspecified affecting right dominant side: Secondary | ICD-10-CM | POA: Diagnosis present

## 2024-08-04 DIAGNOSIS — R509 Fever, unspecified: Secondary | ICD-10-CM | POA: Diagnosis not present

## 2024-08-04 DIAGNOSIS — Z79899 Other long term (current) drug therapy: Secondary | ICD-10-CM

## 2024-08-04 DIAGNOSIS — F0390 Unspecified dementia without behavioral disturbance: Secondary | ICD-10-CM | POA: Diagnosis present

## 2024-08-04 DIAGNOSIS — R569 Unspecified convulsions: Secondary | ICD-10-CM | POA: Diagnosis not present

## 2024-08-04 DIAGNOSIS — I639 Cerebral infarction, unspecified: Secondary | ICD-10-CM

## 2024-08-04 DIAGNOSIS — R29710 NIHSS score 10: Secondary | ICD-10-CM | POA: Diagnosis not present

## 2024-08-04 DIAGNOSIS — I10 Essential (primary) hypertension: Secondary | ICD-10-CM | POA: Diagnosis present

## 2024-08-04 DIAGNOSIS — E876 Hypokalemia: Secondary | ICD-10-CM | POA: Diagnosis not present

## 2024-08-04 DIAGNOSIS — J189 Pneumonia, unspecified organism: Secondary | ICD-10-CM | POA: Diagnosis present

## 2024-08-04 DIAGNOSIS — I428 Other cardiomyopathies: Secondary | ICD-10-CM | POA: Diagnosis present

## 2024-08-04 LAB — DIFFERENTIAL
Abs Immature Granulocytes: 0.02 K/uL (ref 0.00–0.07)
Basophils Absolute: 0 K/uL (ref 0.0–0.1)
Basophils Relative: 0 %
Eosinophils Absolute: 0 K/uL (ref 0.0–0.5)
Eosinophils Relative: 0 %
Immature Granulocytes: 0 %
Lymphocytes Relative: 7 %
Lymphs Abs: 0.5 K/uL — ABNORMAL LOW (ref 0.7–4.0)
Monocytes Absolute: 0.2 K/uL (ref 0.1–1.0)
Monocytes Relative: 2 %
Neutro Abs: 7.1 K/uL (ref 1.7–7.7)
Neutrophils Relative %: 91 %

## 2024-08-04 LAB — CBC
HCT: 43.8 % (ref 39.0–52.0)
Hemoglobin: 14.2 g/dL (ref 13.0–17.0)
MCH: 30.2 pg (ref 26.0–34.0)
MCHC: 32.4 g/dL (ref 30.0–36.0)
MCV: 93.2 fL (ref 80.0–100.0)
Platelets: 241 K/uL (ref 150–400)
RBC: 4.7 MIL/uL (ref 4.22–5.81)
RDW: 10.9 % — ABNORMAL LOW (ref 11.5–15.5)
WBC: 7.9 K/uL (ref 4.0–10.5)
nRBC: 0 % (ref 0.0–0.2)

## 2024-08-04 LAB — I-STAT CHEM 8, ED
BUN: 6 mg/dL — ABNORMAL LOW (ref 8–23)
Calcium, Ion: 1.15 mmol/L (ref 1.15–1.40)
Chloride: 95 mmol/L — ABNORMAL LOW (ref 98–111)
Creatinine, Ser: 0.8 mg/dL (ref 0.61–1.24)
Glucose, Bld: 135 mg/dL — ABNORMAL HIGH (ref 70–99)
HCT: 46 % (ref 39.0–52.0)
Hemoglobin: 15.6 g/dL (ref 13.0–17.0)
Potassium: 3.7 mmol/L (ref 3.5–5.1)
Sodium: 135 mmol/L (ref 135–145)
TCO2: 25 mmol/L (ref 22–32)

## 2024-08-04 LAB — COMPREHENSIVE METABOLIC PANEL WITH GFR
ALT: 38 U/L (ref 0–44)
AST: 35 U/L (ref 15–41)
Albumin: 4.2 g/dL (ref 3.5–5.0)
Alkaline Phosphatase: 78 U/L (ref 38–126)
Anion gap: 17 — ABNORMAL HIGH (ref 5–15)
BUN: 6 mg/dL — ABNORMAL LOW (ref 8–23)
CO2: 22 mmol/L (ref 22–32)
Calcium: 9.8 mg/dL (ref 8.9–10.3)
Chloride: 95 mmol/L — ABNORMAL LOW (ref 98–111)
Creatinine, Ser: 1.02 mg/dL (ref 0.61–1.24)
GFR, Estimated: >60 mL/min (ref >60)
Glucose, Bld: 140 mg/dL — ABNORMAL HIGH (ref 70–99)
Potassium: 3.8 mmol/L (ref 3.5–5.1)
Sodium: 134 mmol/L — ABNORMAL LOW (ref 135–145)
Total Bilirubin: 1.1 mg/dL (ref 0.0–1.2)
Total Protein: 8.8 g/dL — ABNORMAL HIGH (ref 6.5–8.1)

## 2024-08-04 LAB — PROTIME-INR
INR: 1 (ref 0.8–1.2)
Prothrombin Time: 14.2 s (ref 11.4–15.2)

## 2024-08-04 LAB — ETHANOL: Alcohol, Ethyl (B): <15 mg/dL (ref <15)

## 2024-08-04 LAB — MAGNESIUM: Magnesium: 1.7 mg/dL (ref 1.7–2.4)

## 2024-08-04 LAB — CBG MONITORING, ED: Glucose-Capillary: 135 mg/dL — ABNORMAL HIGH (ref 70–99)

## 2024-08-04 LAB — APTT: aPTT: 23 s — ABNORMAL LOW (ref 24–36)

## 2024-08-04 MED ORDER — ACETAMINOPHEN 650 MG RE SUPP
650.0000 mg | Freq: Four times a day (QID) | RECTAL | Status: DC | PRN
  Administered 2024-08-06: 650 mg via RECTAL
  Filled 2024-08-04: qty 1

## 2024-08-04 MED ORDER — HYDRALAZINE HCL 20 MG/ML IJ SOLN: 10.0000 mg | INTRAMUSCULAR | Status: DC | PRN

## 2024-08-04 MED ORDER — ONDANSETRON HCL 4 MG/2ML IJ SOLN: 4.0000 mg | Freq: Four times a day (QID) | INTRAMUSCULAR | Status: DC | PRN

## 2024-08-04 MED ORDER — IOHEXOL 350 MG/ML SOLN
100.0000 mL | Freq: Once | INTRAVENOUS | Status: AC | PRN
  Administered 2024-08-04: 100 mL via INTRAVENOUS

## 2024-08-04 MED ORDER — SODIUM CHLORIDE 0.9% FLUSH: 3.0000 mL | Freq: Once | INTRAVENOUS | Status: DC

## 2024-08-04 MED ORDER — ACETAMINOPHEN 325 MG PO TABS
650.0000 mg | ORAL_TABLET | Freq: Four times a day (QID) | ORAL | Status: DC | PRN
  Administered 2024-08-10: 650 mg via ORAL
  Filled 2024-08-04: qty 2

## 2024-08-04 NOTE — Consult Note (Addendum)
 NEUROLOGY CONSULT NOTE   Date of service: August 04, 2024 Patient Name: Bradley Hansen MRN:  [id number] DOB:  10-27-1951 Chief Complaint: Right-sided weakness and aphasia Requesting Provider: Cottie Bradley PARAS, MD  History of Present Illness  Bradley Hansen is a 73 y.o. French-speaking male with hx of a single episode of post operative atrial flutter, nonischemic cardiomyopathy who presents with right-sided weakness and difficulty speaking.  His wife states that he was last definitely normal yesterday, though seemed to be relatively normal and this morning as well.  At baseline, he is independent, takes care of himself on a daily basis.  LKW: Unclear, likely yesterday Modified rankin score: 0-Completely asymptomatic and back to baseline post- stroke IV Thrombolysis: No, outside of window EVT: No, no LVO   NIHSS components Score: Comment  1a Level of Conscious 0[]  1[x]  2[]  3[]      1b LOC Questions 0[]  1[]  2[x]       1c LOC Commands 0[]  1[x]  2[]       2 Best Gaze 0[x]  1[]  2[]       3 Visual 0[x]  1[]  2[]  3[]      4 Facial Palsy 0[]  1[x]  2[]  3[]      5a Motor Arm - left 0[x]  1[]  2[]  3[]  4[]  UN[]    5b Motor Arm - Right 0[]  1[x]  2[]  3[]  4[]  UN[]    6a Motor Leg - Left 0[x]  1[]  2[]  3[]  4[]  UN[]    6b Motor Leg - Right 0[]  1[x]  2[]  3[]  4[]  UN[]    7 Limb Ataxia 0[x]  1[]  2[]  UN[]      8 Sensory 0[x]  1[]  2[]  UN[]      9 Best Language 0[]  1[]  2[x]  3[]      10 Dysarthria 0[]  1[x]  2[]  UN[]      11 Extinct. and Inattention 0[x]  1[]  2[]       TOTAL: 10      Past History  No past medical history on file.    Family History: No family history on file.  Social History  has no history on file for tobacco use, alcohol use, and drug use.  Not on File  Medications   Current Facility-Administered Medications:    iohexol  (OMNIPAQUE ) 350 MG/ML injection 100 mL, 100 mL, Intravenous, Once PRN, Bradley Bradley SQUIBB, MD   sodium chloride  flush (NS) 0.9 % injection 3 mL, 3 mL, Intravenous, Once, Bradley Hansen,  Bradley PARAS, MD No current outpatient medications on file.  Vitals   Vitals:   08/04/24 2043  Weight: 75 kg    There is no height or weight on file to calculate BMI.   Physical Exam   Constitutional: Appears well-developed and well-nourished.   Neurologic Examination    Neuro: Mental Status: Patient is lethargic but does arouse.  Initially he did not follow commands in either [place] or [place], but after he had been here for a few minutes, he began responding to some simple commands in [place], but only would say one or two words. Patient is able to give a clear and coherent history. No signs neglect Cranial Nerves: II: He clearly blinks tothreat from the left, after I check on the left, however he stopped blinking from either side, so difficult to assess on the right. Pupils are equal, round, and reactive to light.   III,IV, VI: EOMI without ptosis or diploplia.  V: Facial sensation is symmetric to temperature VII: Facial movement is symmetric.  VIII: hearing is intact to voice X: Uvula elevates symmetrically XII: tongue is midline without atrophy or fasciculations.  Motor: He does not participate  with any formal testing, but clearly moves the right side less than the left though he does move both the right arm and leg as well Sensory: He responds to noxious stimulation bilaterally Cerebellar: Does not perform       Labs/Imaging/Neurodiagnostic studies   CBC:  Recent Labs  Lab 09-01-2024 2042 Sep 01, 2024 2045  WBC 7.9  --   NEUTROABS 7.1  --   HGB 14.2 15.6  HCT 43.8 46.0  MCV 93.2  --   PLT 241  --    Basic Metabolic Panel:  Lab Results  Component Value Date   NA 135 2024-09-01   K 3.7 September 01, 2024   GLUCOSE 135 (H) September 01, 2024   BUN 6 (L) Sep 01, 2024   CREATININE 0.80 01-Sep-2024    CT Head without contrast(Personally reviewed): Negative  CT angio Head and Neck with contrast(Personally reviewed): Negative  ASSESSMENT   Bradley Hansen is a 73 y.o. male  with aphasia and right-sided weakness that has been present since at least noon.  My suspicion is that he has had an ischemic stroke, though without it being clearly apparent on CT I will also order an EEG as well.  The duration of his symptoms would argue against seizure as etiology, however  RECOMMENDATIONS  - HgbA1c, fasting lipid panel - MRI of the brain without contrast - Frequent neuro checks - Echocardiogram - CTA head and neck - Prophylactic therapy-Antiplatelet med: Aspirin  - dose 81mg  daily- Risk factor modification - Telemetry monitoring - PT consult, OT consult, Speech consult - EEG - Stroke team to follow  ______________________________________________________________________    Signed, Bradley Seals, MD [facility]

## 2024-08-04 NOTE — Code Documentation (Signed)
 Stroke Response Nurse Documentation Code Documentation  Bradley Hansen is a 73 y.o. male arriving to [facility]  via [facility] on 8/8 with no known past medical hx. On No antithrombotic. Code stroke was activated by EMS.   Patient from home where he was LKW at 0800 and now complaining of Aphasia and right sided weakness.   Stroke team at the bedside on patient arrival. Labs drawn and patient cleared for CT by Dr. Cottie. Patient to CT with team. NIHSS 9, see documentation for details and code stroke times. Patient with decreased LOC, disoriented, not following commands, right facial droop, right arm weakness, right leg weakness, and Expressive aphasia  on exam. The following imaging was completed:  CT Head, CTA, and CTP. Patient is not a candidate for IV Thrombolytic due to outside of window. Patient is not a candidate for IR due to No LVO.   Care Plan: Neuro checks q2 hrs.   Bedside handoff with ED RN Jorene.    Griselda Alm ORN  Rapid Response RN

## 2024-08-04 NOTE — ED Provider Notes (Signed)
 [place] EMERGENCY DEPARTMENT AT [facility] Provider Note   CSN: [id number] Arrival date & time: 08/04/24  2038  An emergency department physician performed an initial assessment on this suspected stroke patient at 2038.  Patient presents with: Code Stroke   Bradley Hansen is a 73 y.o. male here with acute onset aphasia, AMS.  Last seen well by wife at 0800 this morning.  EMS reports patient will not verbalize, has left sided gaze deviation.  Pt french speaking.   HPI     Prior to Admission medications   Medication Sig Start Date End Date Taking? Authorizing Provider  MAVYRET 100-40 MG TABS Take 3 tablets by mouth daily. 07/14/24  Yes [provider]  rosuvastatin  (CRESTOR ) 10 MG tablet Take 10 mg by mouth at bedtime. 06/16/24  Yes [provider]    Allergies: Patient has no allergy information on record.    Review of Systems  Updated Vital Signs BP (!) 150/135   Pulse 97   Temp (!) 101 F (38.3 C) (Rectal)   Resp 20   Wt 75 kg   SpO2 100%   Physical Exam Constitutional:      Comments: Slow to speak, mostly nonverbal  HENT:     Head: Normocephalic and atraumatic.  Eyes:     Conjunctiva/sclera: Conjunctivae normal.     Pupils: Pupils are equal, round, and reactive to light.  Cardiovascular:     Rate and Rhythm: Normal rate and regular rhythm.  Pulmonary:     Effort: Pulmonary effort is normal. No respiratory distress.  Abdominal:     General: There is no distension.     Tenderness: There is no abdominal tenderness.  Skin:    General: Skin is warm and dry.  Neurological:     Mental Status: He is alert.     Comments: Moves all extremities; intermittently follows commands     (all labs ordered are listed, but only abnormal results are displayed) Labs Reviewed  APTT - Abnormal; Notable for the following components:      Result Value   aPTT 23 (*)    All other components within normal limits  CBC - Abnormal; Notable for the  following components:   RDW 10.9 (*)    All other components within normal limits  DIFFERENTIAL - Abnormal; Notable for the following components:   Lymphs Abs 0.5 (*)    All other components within normal limits  COMPREHENSIVE METABOLIC PANEL WITH GFR - Abnormal; Notable for the following components:   Sodium 134 (*)    Chloride 95 (*)    Glucose, Bld 140 (*)    BUN 6 (*)    Total Protein 8.8 (*)    Anion gap 17 (*)    All other components within normal limits  CBC WITH DIFFERENTIAL/PLATELET - Abnormal; Notable for the following components:   RDW 11.1 (*)    All other components within normal limits  COMPREHENSIVE METABOLIC PANEL WITH GFR - Abnormal; Notable for the following components:   Sodium 133 (*)    Glucose, Bld 130 (*)    BUN 7 (*)    Total Protein 8.3 (*)    ALT 47 (*)    All other components within normal limits  RAPID URINE DRUG SCREEN, HOSP PERFORMED - Abnormal; Notable for the following components:   Benzodiazepines POSITIVE (*)    All other components within normal limits  LIPID PANEL - Abnormal; Notable for the following components:   Cholesterol 216 (*)  LDL Cholesterol 155 (*)    All other components within normal limits  I-STAT CHEM 8, ED - Abnormal; Notable for the following components:   Chloride 95 (*)    BUN 6 (*)    Glucose, Bld 135 (*)    All other components within normal limits  CBG MONITORING, ED - Abnormal; Notable for the following components:   Glucose-Capillary 135 (*)    All other components within normal limits  I-STAT VENOUS BLOOD GAS, ED - Abnormal; Notable for the following components:   pO2, Ven 146 (*)    Bicarbonate 30.0 (*)    Acid-Base Excess 4.0 (*)    Sodium 134 (*)    All other components within normal limits  I-STAT ARTERIAL BLOOD GAS, ED - Abnormal; Notable for the following components:   pO2, Arterial 78 (*)    Bicarbonate 28.3 (*)    Acid-Base Excess 4.0 (*)    Sodium 134 (*)    All other components within normal  limits  URINE CULTURE  CULTURE, BLOOD (ROUTINE X 2)  CULTURE, BLOOD (ROUTINE X 2)  CSF CULTURE W GRAM STAIN  PROTIME-INR  ETHANOL  MAGNESIUM   MAGNESIUM   AMMONIA  VITAMIN B12  CK  TSH  URINALYSIS, ROUTINE W REFLEX MICROSCOPIC  BLOOD GAS, VENOUS  HEMOGLOBIN A1C  BLOOD GAS, ARTERIAL  CSF CELL COUNT WITH DIFFERENTIAL  CSF CELL COUNT WITH DIFFERENTIAL  PROTEIN AND GLUCOSE, CSF  MENINGITIS/ENCEPHALITIS PANEL (CSF)    EKG: None  Radiology: DG CHEST PORT 1 VIEW Result Date: 08/05/2024 CLINICAL DATA:  Fever. EXAM: PORTABLE CHEST 1 VIEW COMPARISON:  08/05/2024 and CT chest 07/15/2023. FINDINGS: Trachea is midline. Heart is enlarged, stable. Mild left lobe interstitial prominence. No pleural fluid. Elevated right hemidiaphragm. IMPRESSION: Left lower lobe interstitial prominence may be due to a viral pneumonia or infectious bronchiolitis. Electronically Signed   By: Newell Eke M.D.   On: 08/05/2024 12:37   MR BRAIN WO CONTRAST Result Date: 08/05/2024 EXAM: MRI BRAIN WITHOUT CONTRAST 08/05/2024 08:03:00 AM TECHNIQUE: Multiplanar multisequence MRI of the head/brain was attempted without the administration of intravenous contrast. The patient was unable to tolerate the MRI scan despite sedation. Only axial diffusion weighted images were obtained. COMPARISON: None available. CLINICAL HISTORY: Mental status change, unknown cause. Pt was uncooperative. Second time trying per neuro request. Pt received ativan  the first time and more the second and still not a candidate for MRI. DWI sent and pt went back to room. FINDINGS: BRAIN AND VENTRICLES: No acute or subacute infarction demonstrated on diffusion-weighted images. No focal susceptibility is present on these axial weighted images. IMPRESSION: 1. Limited study due to patient's inability to tolerate the exam. 2. No acute or subacute infarction on axial diffusion-weighted images. Electronically signed by: Lonni Necessary MD 08/05/2024 08:17 AM EDT  RP Workstation: [id number]   EEG adult Result Date: 08/05/2024 Shelton Arlin KIDD, MD     08/05/2024  6:29 AM Patient Name: Bradley Hansen MRN: [id number] Epilepsy Attending: Arlin KIDD Shelton Referring Physician/Provider: Michaela Aisha SQUIBB, MD Date: 08/05/2024 Duration: 30.35 mins Patient history:  73 y.o. M who presents with right-sided weakness and difficulty speaking. EEG to evaluate for seizure Level of alertness: comatose/ lethargic AEDs during EEG study: None Technical aspects: This EEG study was done with scalp electrodes positioned according to the 10-20 International system of electrode placement. Electrical activity was reviewed with band pass filter of 1-70Hz , sensitivity of 7 uV/mm, display speed of 1mm/sec with a 60Hz  notched filter applied as appropriate. EEG data  were recorded continuously and digitally stored.  Video monitoring was available and reviewed as appropriate. Description: EEG showed continuous generalized 3 to 6 Hz theta-delta slowing, at times with triphasic morphology. Hyperventilation and photic stimulation were not performed.   ABNORMALITY - Continuous slow, generalized IMPRESSION: This study is suggestive of moderate diffuse encephalopathy. No seizures or definite epileptiform discharges were seen throughout the recording. Arlin MALVA Krebs   DG Chest Port 1 View Result Date: 08/05/2024 CLINICAL DATA:  Shortness of breath EXAM: PORTABLE CHEST 1 VIEW COMPARISON:  07/15/2023 FINDINGS: Heart is mildly enlarged. Postsurgical changes are again seen. Lungs are well aerated without focal infiltrate or sizable effusion. No bony abnormality is seen. IMPRESSION: No acute abnormality noted. Electronically Signed   By: Oneil Devonshire M.D.   On: 08/05/2024 01:47   CT ANGIO HEAD NECK W WO CM W PERF (CODE STROKE) Result Date: 08/04/2024 CLINICAL DATA:  Initial evaluation for acute neuro deficit, stroke. EXAM: CT ANGIOGRAPHY HEAD AND NECK CT PERFUSION BRAIN TECHNIQUE: Multidetector CT imaging of the  head and neck was performed using the standard protocol during bolus administration of intravenous contrast. Multiplanar CT image reconstructions and MIPs were obtained to evaluate the vascular anatomy. Carotid stenosis measurements (when applicable) are obtained utilizing NASCET criteria, using the distal internal carotid diameter as the denominator. Multiphase CT imaging of the brain was performed following IV bolus contrast injection. Subsequent parametric perfusion maps were calculated using RAPID software. RADIATION DOSE REDUCTION: This exam was performed according to the departmental dose-optimization program which includes automated exposure control, adjustment of the mA and/or kV according to patient size and/or use of iterative reconstruction technique. CONTRAST:  OMNIPAQUE  IOHEXOL  350 MG/ML SOLN COMPARISON:  Head CT from earlier the same day. FINDINGS: CTA NECK FINDINGS Aortic arch: Visualized arch which within normal limits for caliber with standard branch pattern. Aortic atherosclerosis. No significant stenosis about the origin the great vessels. Right carotid system: Right common and internal carotid arteries are patent without dissection. Mild eccentric plaque about the right carotid bulb without hemodynamically significant stenosis. Left carotid system: Left common and internal carotid arteries are patent without dissection. Mild atheromatous change about the left carotid bulb without hemodynamically significant graded 50% stenosis. Vertebral arteries: Both vertebral arteries arise from subclavian arteries. Atheromatous plaque at the origins of both vertebral arteries with severe ostial stenoses. Vertebral arteries patent distally without stenosis or dissection. Skeleton: No worrisome osseous lesions. Mild-to-moderate spondylosis at C5-6 and C6-7. Other neck: No other acute finding. Upper chest: No other acute finding. Review of the MIP images confirms the above findings CTA HEAD FINDINGS  Anterior circulation: Both internal carotid arteries are patent to the termini without significant stenosis. A1 segments patent bilaterally. Normal anterior communicating artery complex. Anterior cerebral arteries patent without significant stenosis. No M1 stenosis or occlusion. Distal MCA branches perfused and symmetric. Posterior circulation: Both V4 segments patent without stenosis. Both PICA patent. Basilar patent without stenosis. Superior cerebellar arteries patent bilaterally. Right PCA supplied via the basilar as well as a small right posterior communicating artery. Fetal type origin left PCA. Both PCAs patent without significant stenosis. Venous sinuses: Grossly patent allowing for timing the contrast bolus. Anatomic variants: As above.  No aneurysm. Review of the MIP images confirms the above findings CT Brain Perfusion Findings: ASPECTS: 10 CBF (<30%) Volume: 0mL Perfusion (Tmax>6.0s) volume: 10mL Mismatch Volume: 10mL Infarction Location:Negative CT perfusion for acute core infarct. Apparent 10 mL area of delayed perfusion at the right parieto-occipital region, suspected be related to the  fetal type left PCA. No other convincing perfusion abnormality. IMPRESSION: 1. Negative CTA for large vessel occlusion or other emergent finding. 2. Negative CT perfusion for acute ischemia or other perfusion abnormality. 3. Atheromatous plaque at the origins of both vertebral arteries with severe ostial stenoses. 4. Mild atheromatous change about the carotid bifurcations without hemodynamically significant stenosis. 5.  Aortic Atherosclerosis (ICD10-I70.0). These results were communicated to Dr. Michaela at 9:17 pm on 08/04/2024 by text page via the [facility] messaging system. Electronically Signed   By: Morene Hoard M.D.   On: 08/04/2024 21:18   CT HEAD CODE STROKE WO CONTRAST Result Date: 08/04/2024 CLINICAL DATA:  Code stroke. A shin initial evaluation for acute neuro deficit, stroke. EXAM: CT HEAD WITHOUT  CONTRAST TECHNIQUE: Contiguous axial images were obtained from the base of the skull through the vertex without intravenous contrast. RADIATION DOSE REDUCTION: This exam was performed according to the departmental dose-optimization program which includes automated exposure control, adjustment of the mA and/or kV according to patient size and/or use of iterative reconstruction technique. COMPARISON:  None Available. FINDINGS: Brain: Cerebral volume within normal limits. Patchy hypodensity involving the supratentorial cerebral white matter, most characteristic of chronic small vessel ischemic disease, moderate to advanced in nature. No acute intracranial hemorrhage. No visible acute large vessel territory infarct. No mass lesion or midline shift. Mild ventricular prominence without hydrocephalus. No extra-axial fluid collection. Vascular: There is question of an asymmetric hyperdensity involving the distal left M1 segment/left MCA bifurcation (series 6, image 45). Skull: Scalp soft tissues within normal limits.  Calvarium intact. Sinuses/Orbits: Globes and orbital soft tissues demonstrate no acute finding. Mild chronic mucosal thickening present about the ethmoidal air cells and maxillary sinuses. Paranasal sinuses are otherwise largely clear. No significant mastoid effusion. Other: None. ASPECTS [facility] Stroke Program Early CT Score) - Ganglionic level infarction (caudate, lentiform nuclei, internal capsule, insula, M1-M3 cortex): 7 - Supraganglionic infarction (M4-M6 cortex): 3 Total score (0-10 with 10 being normal): 10 IMPRESSION: 1. Question asymmetric hyperdensity involving the distal left M1 segment/left MCA bifurcation, which could reflect thrombus. Correlation with dedicated CTA recommended. No acute intracranial hemorrhage or visible acute large vessel territory infarct. 2. Aspects is 10. 3. Moderate to advanced chronic microvascular ischemic disease. These results were communicated to Dr. Michaela at  8:57 pm on 08/04/2024 by text page via the [facility] messaging system. Electronically Signed   By: Morene Hoard M.D.   On: 08/04/2024 21:00     Procedures   Medications Ordered in the ED  sodium chloride  flush (NS) 0.9 % injection 3 mL (3 mLs Intravenous Not Given 08/04/24 2116)  acetaminophen  (TYLENOL ) tablet 650 mg (has no administration in time range)    Or  acetaminophen  (TYLENOL ) suppository 650 mg (has no administration in time range)  ondansetron  (ZOFRAN ) injection 4 mg (has no administration in time range)  hydrALAZINE  (APRESOLINE ) injection 10 mg (has no administration in time range)  scopolamine  (TRANSDERM-SCOP) 1 MG/3DAYS 1.5 mg (1.5 mg Transdermal Patch Applied 08/05/24 0137)  aspirin  EC tablet 81 mg (81 mg Oral Not Given 08/05/24 0852)    Or  aspirin  suppository 150 mg ( Rectal See Alternative 08/05/24 0852)  cefTRIAXone  (ROCEPHIN ) 2 g in sodium chloride  0.9 % 100 mL IVPB (has no administration in time range)  vancomycin  (VANCOREADY) IVPB 1500 mg/300 mL (has no administration in time range)  ampicillin  (OMNIPEN) 2 g in sodium chloride  0.9 % 100 mL IVPB (has no administration in time range)  acyclovir  (ZOVIRAX ) 750 mg in dextrose  5 %  250 mL IVPB (has no administration in time range)  0.9 %  sodium chloride  infusion (has no administration in time range)  vancomycin  (VANCOCIN ) IVPB 1000 mg/200 mL premix (has no administration in time range)  iohexol  (OMNIPAQUE ) 350 MG/ML injection 100 mL (100 mLs Intravenous Contrast Given 08/04/24 2104)  LORazepam  (ATIVAN ) injection 2 mg (2 mg Intravenous Given 08/05/24 0603)  LORazepam  (ATIVAN ) injection 2 mg (2 mg Intravenous Given 08/05/24 0737)  lidocaine  (XYLOCAINE ) injection 1 % - NO CHARGE (5 mLs Intradermal Given 08/05/24 1307)  lidocaine  (PF) (XYLOCAINE ) 1 % injection 5 mL (5 mLs Intradermal Given 08/05/24 1316)    Clinical Course as of 08/05/24 1422  Fri Aug 04, 2024  2127 Pt now able to verbalize, still seems confused [MT]  2223 Admitted to Dr  Marcene hospitalist [MT]    Clinical Course User Index [MT] Cottie Donnice PARAS, MD                                 Medical Decision Making Amount and/or Complexity of Data Reviewed Labs: ordered. Radiology: ordered.  Risk Decision regarding hospitalization.   This patient presents to the Emergency Department with complaint of altered mental status.  This involves an extensive number of treatment options, and is a complaint that carries with it a high risk of complications and morbidity.  The differential diagnosis includes hypoglycemia vs metabolic encephalopathy vs infection (including cystitis) vs ICH vs stroke vs polypharmacy vs other  I ordered, reviewed, and interpreted labs: no emergent findings, UA pending on admission I ordered imaging studies which included CT head, CT angio  I independently visualized and interpreted imaging which showed no evident stroke Additional history was obtained from EMS  I consulted neurology and discussed lab and imaging findings - they recommend MRI imaging, admission for AMS and possible EEG. No clear evidence this is status epilepticus at this time.  Patient's mental status had some improvement in ED, able to speak slowly, but still appears confused.  No clear evidence of sepsis, PE, ACS per presentation, or meningitis.  Further care per inpatient service. MRI pending on admission.      Final diagnoses:  Altered mental status, unspecified altered mental status type    ED Discharge Orders     None          Cottie Donnice PARAS, MD 08/05/24 1422

## 2024-08-04 NOTE — H&P (Signed)
 History and Physical      Bradley Hansen FMW:[id number] DOB: Aug 28, 1951 DOA: 08/04/2024; DOS: 08/04/2024  PCP: Pcp, No  Patient coming from: home   I have personally briefly reviewed patient's old medical records in [facility]  Chief Complaint: Expressive aphasia  HPI: Bradley Hansen is a 73 y.o. male with no reported significant past medical history, who is admitted to [facility] on 08/04/2024 with expressive aphasia after presenting from home to [facility] ED complaining of expressive aphasia.   In the setting of the patient's expressive Aphasia, confusion, following history is provided by the patient's wife in addition to my discussions with the EDP and chart review.  [Patient has no known significant past medical history, she reports that around noon today, the patient became acutely confused and aphasic, prompting the patient be brought to [facility] emergency department for further evaluation management [facility] did not note any tonic-clonic activity nor any tongue biting or loss of bowel/bladder function.  She conveys that he was in his normal state of health leading up to the onset of the above.   ED Course:  Vital signs in the ED were notable for the following: Afebrile; rate 80s 97.  Blood pressures in the 150s to 180s; respiratory rate 17-22, oxygen saturation 99 to 100% on room air.  Labs were notable for the following: CMP notable for sodium 134, bicarbonate 22, creatinine 1.02, glucose 140, liver enzymes within normal limits presume ethanol of less than 15.  CBC notable for leukocyte count 7900, urinalysis is currently pending.  Per my interpretation, EKG in ED demonstrated the following: No EKG performed in the ED today.  Imaging in the ED, per corresponding formal radiology read, was notable for the following: Noncontrast CTh evidence of acute intracranial hemorrhage nor any overt evidence of large vessel territory infarct.  CTA head and neck showed evidence of large  vessel occlusion was also negative for acute ischemia or other perfusion abnormality.  EDP has discussed with on-call neurology, Dr. Michaela, who is formally consulting and expresses suspicion for potential acute ischemic stroke, recommending [facility] admission for further evaluation of such, including MRI brain.  MRI of the brain ordered, with result currently pending.  While in the ED, the following were administered: None.  Subsequently, the patient was admitted for further evaluation management presenting expressive aphasia as well as acute encephalopathy.     Review of Systems: As per HPI otherwise 10 point review of systems negative.   History reviewed. No pertinent past medical history.  History reviewed. No pertinent surgical history.  Social History:  reports that he does not currently use alcohol. He reports that he does not use drugs. No history on file for tobacco use.   Not on File  History reviewed. No pertinent family history.  Reported to be on no prescription or over-the-counter medications as an outpatient.   Objective    Physical Exam: Vitals:   08/04/24 2043 08/04/24 2115  BP:  (!) 186/92  Pulse:  86  Resp:  20  Temp:  98.1 F (36.7 C)  TempSrc:  Oral  SpO2:  100%  Weight: 75 kg     General: appears to be stated age; somnolent, aphasic Skin: warm, dry, no rash Head:  AT/[place] Mouth:  Oral mucosa membranes appear moist, normal dentition Neck: supple; trachea midline Heart:  RRR; did not appreciate any M/R/G Lungs: CTAB, did not appreciate any wheezes, rales, or rhonchi Abdomen: + BS; soft, ND, NT Vascular: 2+ pedal pulses  b/l; 2+ radial pulses b/l Extremities: no peripheral edema, no muscle wasting   Labs on Admission: I have personally reviewed following labs and imaging studies  CBC: Recent Labs  Lab 08/04/24 2042 08/04/24 2045  WBC 7.9  --   NEUTROABS 7.1  --   HGB 14.2 15.6  HCT 43.8 46.0  MCV 93.2  --   PLT 241  --    Basic  Metabolic Panel: Recent Labs  Lab 08/04/24 2042 08/04/24 2045  NA 134* 135  K 3.8 3.7  CL 95* 95*  CO2 22  --   GLUCOSE 140* 135*  BUN 6* 6*  CREATININE 1.02 0.80  CALCIUM  9.8  --    GFR: CrCl cannot be calculated (Unknown ideal weight.). Liver Function Tests: Recent Labs  Lab 08/04/24 2042  AST 35  ALT 38  ALKPHOS 78  BILITOT 1.1  PROT 8.8*  ALBUMIN 4.2   No results for input(s): LIPASE, AMYLASE in the last 168 hours. No results for input(s): AMMONIA in the last 168 hours. Coagulation Profile: Recent Labs  Lab 08/04/24 2042  INR 1.0   Cardiac Enzymes: No results for input(s): CKTOTAL, CKMB, CKMBINDEX, TROPONINI in the last 168 hours. BNP (last 3 results) No results for input(s): PROBNP in the last 8760 hours. HbA1C: No results for input(s): HGBA1C in the last 72 hours. CBG: Recent Labs  Lab 08/04/24 2040  GLUCAP 135*   Lipid Profile: No results for input(s): CHOL, HDL, LDLCALC, TRIG, CHOLHDL, LDLDIRECT in the last 72 hours. Thyroid Function Tests: No results for input(s): TSH, T4TOTAL, FREET4, T3FREE, THYROIDAB in the last 72 hours. Anemia Panel: No results for input(s): VITAMINB12, FOLATE, FERRITIN, TIBC, IRON, RETICCTPCT in the last 72 hours. Urine analysis: No results found for: COLORURINE, APPEARANCEUR, LABSPEC, PHURINE, GLUCOSEU, HGBUR, BILIRUBINUR, KETONESUR, PROTEINUR, UROBILINOGEN, NITRITE, LEUKOCYTESUR  Radiological Exams on Admission: CT ANGIO HEAD NECK W WO CM W PERF (CODE STROKE) Result Date: 08/04/2024 CLINICAL DATA:  Initial evaluation for acute neuro deficit, stroke. EXAM: CT ANGIOGRAPHY HEAD AND NECK CT PERFUSION BRAIN TECHNIQUE: Multidetector CT imaging of the head and neck was performed using the standard protocol during bolus administration of intravenous contrast. Multiplanar CT image reconstructions and MIPs were obtained to evaluate the vascular anatomy. Carotid  stenosis measurements (when applicable) are obtained utilizing NASCET criteria, using the distal internal carotid diameter as the denominator. Multiphase CT imaging of the brain was performed following IV bolus contrast injection. Subsequent parametric perfusion maps were calculated using RAPID software. RADIATION DOSE REDUCTION: This exam was performed according to the departmental dose-optimization program which includes automated exposure control, adjustment of the mA and/or kV according to patient size and/or use of iterative reconstruction technique. CONTRAST:  100mL OMNIPAQUE  IOHEXOL  350 MG/ML SOLN COMPARISON:  Head CT from earlier the same day. FINDINGS: CTA NECK FINDINGS Aortic arch: Visualized arch which within normal limits for caliber with standard branch pattern. Aortic atherosclerosis. No significant stenosis about the origin the great vessels. Right carotid system: Right common and internal carotid arteries are patent without dissection. Mild eccentric plaque about the right carotid bulb without hemodynamically significant stenosis. Left carotid system: Left common and internal carotid arteries are patent without dissection. Mild atheromatous change about the left carotid bulb without hemodynamically significant graded 50% stenosis. Vertebral arteries: Both vertebral arteries arise from subclavian arteries. Atheromatous plaque at the origins of both vertebral arteries with severe ostial stenoses. Vertebral arteries patent distally without stenosis or dissection. Skeleton: No worrisome osseous lesions. Mild-to-moderate spondylosis at C5-6 and C6-7. Other  neck: No other acute finding. Upper chest: No other acute finding. Review of the MIP images confirms the above findings CTA HEAD FINDINGS Anterior circulation: Both internal carotid arteries are patent to the termini without significant stenosis. A1 segments patent bilaterally. Normal anterior communicating artery complex. Anterior cerebral arteries  patent without significant stenosis. No M1 stenosis or occlusion. Distal MCA branches perfused and symmetric. Posterior circulation: Both V4 segments patent without stenosis. Both PICA patent. Basilar patent without stenosis. Superior cerebellar arteries patent bilaterally. Right PCA supplied via the basilar as well as a small right posterior communicating artery. Fetal type origin left PCA. Both PCAs patent without significant stenosis. Venous sinuses: Grossly patent allowing for timing the contrast bolus. Anatomic variants: As above.  No aneurysm. Review of the MIP images confirms the above findings CT Brain Perfusion Findings: ASPECTS: 10 CBF (<30%) Volume: 0mL Perfusion (Tmax>6.0s) volume: 10mL Mismatch Volume: 10mL Infarction Location:Negative CT perfusion for acute core infarct. Apparent 10 mL area of delayed perfusion at the right parieto-occipital region, suspected be related to the fetal type left PCA. No other convincing perfusion abnormality. IMPRESSION: 1. Negative CTA for large vessel occlusion or other emergent finding. 2. Negative CT perfusion for acute ischemia or other perfusion abnormality. 3. Atheromatous plaque at the origins of both vertebral arteries with severe ostial stenoses. 4. Mild atheromatous change about the carotid bifurcations without hemodynamically significant stenosis. 5.  Aortic Atherosclerosis (ICD10-I70.0). These results were communicated to Dr. Michaela at 9:17 pm on 08/04/2024 by text page via the [facility] messaging system. Electronically Signed   By: Morene Hoard M.D.   On: 08/04/2024 21:18   CT HEAD CODE STROKE WO CONTRAST Result Date: 08/04/2024 CLINICAL DATA:  Code stroke. A shin initial evaluation for acute neuro deficit, stroke. EXAM: CT HEAD WITHOUT CONTRAST TECHNIQUE: Contiguous axial images were obtained from the base of the skull through the vertex without intravenous contrast. RADIATION DOSE REDUCTION: This exam was performed according to the departmental  dose-optimization program which includes automated exposure control, adjustment of the mA and/or kV according to patient size and/or use of iterative reconstruction technique. COMPARISON:  None Available. FINDINGS: Brain: Cerebral volume within normal limits. Patchy hypodensity involving the supratentorial cerebral white matter, most characteristic of chronic small vessel ischemic disease, moderate to advanced in nature. No acute intracranial hemorrhage. No visible acute large vessel territory infarct. No mass lesion or midline shift. Mild ventricular prominence without hydrocephalus. No extra-axial fluid collection. Vascular: There is question of an asymmetric hyperdensity involving the distal left M1 segment/left MCA bifurcation (series 6, image 45). Skull: Scalp soft tissues within normal limits.  Calvarium intact. Sinuses/Orbits: Globes and orbital soft tissues demonstrate no acute finding. Mild chronic mucosal thickening present about the ethmoidal air cells and maxillary sinuses. Paranasal sinuses are otherwise largely clear. No significant mastoid effusion. Other: None. ASPECTS [facility] Stroke Program Early CT Score) - Ganglionic level infarction (caudate, lentiform nuclei, internal capsule, insula, M1-M3 cortex): 7 - Supraganglionic infarction (M4-M6 cortex): 3 Total score (0-10 with 10 being normal): 10 IMPRESSION: 1. Question asymmetric hyperdensity involving the distal left M1 segment/left MCA bifurcation, which could reflect thrombus. Correlation with dedicated CTA recommended. No acute intracranial hemorrhage or visible acute large vessel territory infarct. 2. Aspects is 10. 3. Moderate to advanced chronic microvascular ischemic disease. These results were communicated to Dr. Michaela at 8:57 pm on 08/04/2024 by text page via the [facility] messaging system. Electronically Signed   By: Morene Hoard M.D.   On: 08/04/2024 21:00  Assessment/Plan   Principal Problem:   Expressive  aphasia Active Problems:   Acute encephalopathy    #) Expressive aphasia: Acute onset, associated with confusion, starting at noon on 08/04/2024.  Neurology to formally consult, with associated differential includes acute ischemic stroke versus seizures.  CT head and CTA head and neck showed no acute process, MRI brain is pending.   Plan: Follow-up result of MRI brain.  Neurology consult.  If MRI brain is positive for stroke, would not pursue focused ischemic stroke order set, including evaluation of modifiable ischemic CVA risk factors.  For now, I have ordered prn IV hydralazine  for systolic blood pressure greater than 220.  Monitor on telemetry.  Every 4 hours neurochecks.  N.p.o. for now.                  # ) acute encephalopathy: Onset of confusion, diminished responsiveness starting at noon on 08/04/2024.  Etiology unclear at this time, with differential includes acute ischemic CVA versus seizures.  Will expand evaluation for metabolic possibilities, as further outlined below.  No overt evidence of underlying factious process at this time, including chest x-ray that showed no evidence of acute cardiopulmonary process.  Urinalysis is pending.  Plan: Further evaluation management of presenting expressive aphasia, as above, including MRI brain.  Check ammonia, B12, CPK, urinary drug screen, TSH, VBG.  Follow-up result urinalysis.  Pete CMP, CBC in the morning.  Check magnesium  level.       DVT prophylaxis: SCD's   Code Status: Full code Family Communication: wife present at bedside. Disposition Plan: Per Rounding Team Consults called: EDP has discussed with on-call neurology, Dr. Michaela, who is formally consulting and expresses suspicion for potential acute ischemic stroke, recommending [facility] admission for further evaluation of such, including MRI brain.;  Admission status: Observation     I SPENT GREATER THAN 75  MINUTES IN CLINICAL CARE TIME/MEDICAL DECISION-MAKING IN  COMPLETING THIS ADMISSION.      Jozef Eisenbeis B Velma Agnes DO [facility]  From 7PM - 7AM   08/04/2024, 10:47 PM

## 2024-08-04 NOTE — ED Triage Notes (Signed)
 Pt coming for stroke. Pt last seen normal this morning around 8. Pt having some aphasia but also had some incontinence.

## 2024-08-05 ENCOUNTER — Inpatient Hospital Stay (HOSPITAL_COMMUNITY)

## 2024-08-05 ENCOUNTER — Encounter (HOSPITAL_COMMUNITY): Payer: Self-pay | Admitting: Internal Medicine

## 2024-08-05 ENCOUNTER — Observation Stay (HOSPITAL_COMMUNITY)

## 2024-08-05 DIAGNOSIS — R4182 Altered mental status, unspecified: Secondary | ICD-10-CM | POA: Diagnosis present

## 2024-08-05 DIAGNOSIS — R569 Unspecified convulsions: Secondary | ICD-10-CM

## 2024-08-05 DIAGNOSIS — R4701 Aphasia: Secondary | ICD-10-CM | POA: Diagnosis not present

## 2024-08-05 DIAGNOSIS — G9340 Encephalopathy, unspecified: Secondary | ICD-10-CM | POA: Diagnosis present

## 2024-08-05 DIAGNOSIS — R509 Fever, unspecified: Secondary | ICD-10-CM | POA: Diagnosis not present

## 2024-08-05 DIAGNOSIS — I10 Essential (primary) hypertension: Secondary | ICD-10-CM | POA: Diagnosis present

## 2024-08-05 DIAGNOSIS — G039 Meningitis, unspecified: Secondary | ICD-10-CM

## 2024-08-05 DIAGNOSIS — G8191 Hemiplegia, unspecified affecting right dominant side: Secondary | ICD-10-CM | POA: Diagnosis present

## 2024-08-05 DIAGNOSIS — I342 Nonrheumatic mitral (valve) stenosis: Secondary | ICD-10-CM | POA: Diagnosis not present

## 2024-08-05 DIAGNOSIS — F0390 Unspecified dementia without behavioral disturbance: Secondary | ICD-10-CM | POA: Diagnosis present

## 2024-08-05 DIAGNOSIS — Z79899 Other long term (current) drug therapy: Secondary | ICD-10-CM | POA: Diagnosis not present

## 2024-08-05 DIAGNOSIS — E871 Hypo-osmolality and hyponatremia: Secondary | ICD-10-CM | POA: Diagnosis present

## 2024-08-05 DIAGNOSIS — E876 Hypokalemia: Secondary | ICD-10-CM | POA: Diagnosis not present

## 2024-08-05 DIAGNOSIS — J189 Pneumonia, unspecified organism: Secondary | ICD-10-CM | POA: Diagnosis present

## 2024-08-05 DIAGNOSIS — G9341 Metabolic encephalopathy: Secondary | ICD-10-CM | POA: Diagnosis present

## 2024-08-05 DIAGNOSIS — I428 Other cardiomyopathies: Secondary | ICD-10-CM | POA: Diagnosis present

## 2024-08-05 DIAGNOSIS — E785 Hyperlipidemia, unspecified: Secondary | ICD-10-CM | POA: Diagnosis present

## 2024-08-05 LAB — RAPID URINE DRUG SCREEN, HOSP PERFORMED
Amphetamines: NOT DETECTED
Barbiturates: NOT DETECTED
Benzodiazepines: POSITIVE — AB
Cocaine: NOT DETECTED
Opiates: NOT DETECTED
Tetrahydrocannabinol: NOT DETECTED

## 2024-08-05 LAB — CBC WITH DIFFERENTIAL/PLATELET
Basophils Absolute: 0 K/uL (ref 0.0–0.1)
Basophils Relative: 0 %
Eosinophils Absolute: 0 K/uL (ref 0.0–0.5)
Eosinophils Relative: 0 %
HCT: 40.6 % (ref 39.0–52.0)
Hemoglobin: 13.9 g/dL (ref 13.0–17.0)
Lymphocytes Relative: 13 %
Lymphs Abs: 1.1 K/uL (ref 0.7–4.0)
MCH: 30.6 pg (ref 26.0–34.0)
MCHC: 34.2 g/dL (ref 30.0–36.0)
MCV: 89.4 fL (ref 80.0–100.0)
Monocytes Absolute: 0.4 K/uL (ref 0.1–1.0)
Monocytes Relative: 5 %
Neutro Abs: 6.9 K/uL (ref 1.7–7.7)
Neutrophils Relative %: 82 %
Platelets: 222 K/uL (ref 150–400)
RBC: 4.54 MIL/uL (ref 4.22–5.81)
RDW: 11.1 % — ABNORMAL LOW (ref 11.5–15.5)
WBC: 8.4 K/uL (ref 4.0–10.5)
nRBC: 0 % (ref 0.0–0.2)

## 2024-08-05 LAB — COMPREHENSIVE METABOLIC PANEL WITH GFR
ALT: 47 U/L — ABNORMAL HIGH (ref 0–44)
AST: 39 U/L (ref 15–41)
Albumin: 4 g/dL (ref 3.5–5.0)
Alkaline Phosphatase: 72 U/L (ref 38–126)
Anion gap: 13 (ref 5–15)
BUN: 7 mg/dL — ABNORMAL LOW (ref 8–23)
CO2: 22 mmol/L (ref 22–32)
Calcium: 9.9 mg/dL (ref 8.9–10.3)
Chloride: 98 mmol/L (ref 98–111)
Creatinine, Ser: 1 mg/dL (ref 0.61–1.24)
GFR, Estimated: >60 mL/min (ref >60)
Glucose, Bld: 130 mg/dL — ABNORMAL HIGH (ref 70–99)
Potassium: 3.8 mmol/L (ref 3.5–5.1)
Sodium: 133 mmol/L — ABNORMAL LOW (ref 135–145)
Total Bilirubin: 1.2 mg/dL (ref 0.0–1.2)
Total Protein: 8.3 g/dL — ABNORMAL HIGH (ref 6.5–8.1)

## 2024-08-05 LAB — I-STAT ARTERIAL BLOOD GAS, ED
Acid-Base Excess: 4 mmol/L — ABNORMAL HIGH (ref 0.0–2.0)
Bicarbonate: 28.3 mmol/L — ABNORMAL HIGH (ref 20.0–28.0)
Calcium, Ion: 1.26 mmol/L (ref 1.15–1.40)
HCT: 40 % (ref 39.0–52.0)
Hemoglobin: 13.6 g/dL (ref 13.0–17.0)
O2 Saturation: 96 %
Patient temperature: 98.6
Potassium: 3.5 mmol/L (ref 3.5–5.1)
Sodium: 134 mmol/L — ABNORMAL LOW (ref 135–145)
TCO2: 30 mmol/L (ref 22–32)
pCO2 arterial: 42 mmHg (ref 32–48)
pH, Arterial: 7.436 (ref 7.35–7.45)
pO2, Arterial: 78 mmHg — ABNORMAL LOW (ref 83–108)

## 2024-08-05 LAB — AMMONIA: Ammonia: 18 umol/L (ref 9–35)

## 2024-08-05 LAB — I-STAT VENOUS BLOOD GAS, ED
Acid-Base Excess: 4 mmol/L — ABNORMAL HIGH (ref 0.0–2.0)
Bicarbonate: 30 mmol/L — ABNORMAL HIGH (ref 20.0–28.0)
Calcium, Ion: 1.19 mmol/L (ref 1.15–1.40)
HCT: 43 % (ref 39.0–52.0)
Hemoglobin: 14.6 g/dL (ref 13.0–17.0)
O2 Saturation: 99 %
Potassium: 3.9 mmol/L (ref 3.5–5.1)
Sodium: 134 mmol/L — ABNORMAL LOW (ref 135–145)
TCO2: 32 mmol/L (ref 22–32)
pCO2, Ven: 50.6 mmHg (ref 44–60)
pH, Ven: 7.382 (ref 7.25–7.43)
pO2, Ven: 146 mmHg — ABNORMAL HIGH (ref 32–45)

## 2024-08-05 LAB — VITAMIN B12: Vitamin B-12: 416 pg/mL (ref 180–914)

## 2024-08-05 LAB — LIPID PANEL
Cholesterol: 216 mg/dL — ABNORMAL HIGH (ref 0–200)
HDL: 52 mg/dL (ref >40)
LDL Cholesterol: 155 mg/dL — ABNORMAL HIGH (ref 0–99)
Total CHOL/HDL Ratio: 4.2 ratio
Triglycerides: 45 mg/dL (ref <150)
VLDL: 9 mg/dL (ref 0–40)

## 2024-08-05 LAB — MRSA NEXT GEN BY PCR, NASAL: MRSA by PCR Next Gen: NOT DETECTED

## 2024-08-05 LAB — TSH: TSH: 1.023 u[IU]/mL (ref 0.350–4.500)

## 2024-08-05 LAB — CK: Total CK: 182 U/L (ref 49–397)

## 2024-08-05 LAB — MAGNESIUM: Magnesium: 1.8 mg/dL (ref 1.7–2.4)

## 2024-08-05 MED ORDER — SODIUM CHLORIDE 0.9 % IV SOLN
2.0000 g | INTRAVENOUS | Status: DC
  Administered 2024-08-05: 2 g via INTRAVENOUS
  Filled 2024-08-05 (×3): qty 2000

## 2024-08-05 MED ORDER — LIDOCAINE 1 % OPTIME INJ - NO CHARGE
5.0000 mL | Freq: Once | INTRAMUSCULAR | Status: AC
  Administered 2024-08-05: 5 mL via INTRADERMAL
  Filled 2024-08-05: qty 6

## 2024-08-05 MED ORDER — ASPIRIN 81 MG PO TBEC
81.0000 mg | DELAYED_RELEASE_TABLET | Freq: Every day | ORAL | Status: DC
  Administered 2024-08-07 – 2024-08-09 (×6): 81 mg via ORAL
  Filled 2024-08-05 (×3): qty 1

## 2024-08-05 MED ORDER — VANCOMYCIN HCL IN DEXTROSE 1-5 GM/200ML-% IV SOLN
1000.0000 mg | Freq: Two times a day (BID) | INTRAVENOUS | Status: DC
  Administered 2024-08-06 – 2024-08-07 (×4): 1000 mg via INTRAVENOUS
  Filled 2024-08-05 (×3): qty 200

## 2024-08-05 MED ORDER — SCOPOLAMINE 1 MG/3DAYS TD PT72
1.0000 | MEDICATED_PATCH | TRANSDERMAL | Status: DC
  Administered 2024-08-05: 1.5 mg via TRANSDERMAL
  Filled 2024-08-05 (×2): qty 1

## 2024-08-05 MED ORDER — SODIUM CHLORIDE 0.9 % IV SOLN: INTRAVENOUS | Status: DC

## 2024-08-05 MED ORDER — SODIUM CHLORIDE 0.9 % IV SOLN
2.0000 g | Freq: Two times a day (BID) | INTRAVENOUS | Status: DC
  Administered 2024-08-05 – 2024-08-07 (×7): 2 g via INTRAVENOUS
  Filled 2024-08-05 (×5): qty 20

## 2024-08-05 MED ORDER — SODIUM CHLORIDE 0.9 % IV SOLN
2.0000 g | INTRAVENOUS | Status: DC
  Administered 2024-08-05 – 2024-08-07 (×13): 2 g via INTRAVENOUS
  Filled 2024-08-05 (×11): qty 2000

## 2024-08-05 MED ORDER — LORAZEPAM 2 MG/ML IJ SOLN
2.0000 mg | Freq: Once | INTRAMUSCULAR | Status: AC
  Administered 2024-08-05: 2 mg via INTRAVENOUS
  Filled 2024-08-05: qty 1

## 2024-08-05 MED ORDER — VANCOMYCIN HCL 1500 MG/300ML IV SOLN
1500.0000 mg | Freq: Once | INTRAVENOUS | Status: AC
  Administered 2024-08-05: 1500 mg via INTRAVENOUS
  Filled 2024-08-05: qty 300

## 2024-08-05 MED ORDER — ASPIRIN 300 MG RE SUPP
150.0000 mg | Freq: Every day | RECTAL | Status: DC
  Administered 2024-08-06: 150 mg via RECTAL
  Filled 2024-08-05 (×2): qty 1

## 2024-08-05 MED ORDER — DEXTROSE 5 % IV SOLN
10.0000 mg/kg | Freq: Three times a day (TID) | INTRAVENOUS | Status: DC
  Administered 2024-08-05 – 2024-08-07 (×8): 750 mg via INTRAVENOUS
  Filled 2024-08-05 (×9): qty 15

## 2024-08-05 MED ORDER — LIDOCAINE HCL (PF) 1 % IJ SOLN
5.0000 mL | Freq: Once | INTRAMUSCULAR | Status: AC
  Administered 2024-08-05: 5 mL via INTRADERMAL

## 2024-08-05 NOTE — ED Notes (Signed)
 Pt back from MRI, pt unable to hold still for MRI.

## 2024-08-05 NOTE — Procedures (Signed)
 Patient Name: Bradley Hansen  MRN: [id number]  Epilepsy Attending: Arlin MALVA Krebs  Referring Physician/Provider: Michaela Aisha SQUIBB, MD  Date: 08/05/2024 Duration: 30.35 mins  Patient history:  73 y.o. M who presents with right-sided weakness and difficulty speaking. EEG to evaluate for seizure  Level of alertness: comatose/ lethargic   AEDs during EEG study: None  Technical aspects: This EEG study was done with scalp electrodes positioned according to the 10-20 International system of electrode placement. Electrical activity was reviewed with band pass filter of 1-70Hz , sensitivity of 7 uV/mm, display speed of 76mm/sec with a 60Hz  notched filter applied as appropriate. EEG data were recorded continuously and digitally stored.  Video monitoring was available and reviewed as appropriate.  Description: EEG showed continuous generalized 3 to 6 Hz theta-delta slowing, at times with triphasic morphology. Hyperventilation and photic stimulation were not performed.     ABNORMALITY - Continuous slow, generalized  IMPRESSION: This study is suggestive of moderate diffuse encephalopathy. No seizures or definite epileptiform discharges were seen throughout the recording.  Otha Rickles O Deb L[place]in

## 2024-08-05 NOTE — Procedures (Signed)
 LUMBAR PUNCTURE (SPINAL TAP) PROCEDURE NOTE  Indication: Evaluate for meningitis   Proceduralists: Mimi Ny, NP: Eligio Lav, MD   Risks, benefits and alternatives of the procedure were dicussed with the patient including but not limited to post-LP headache, bleeding, infection, weakness/numbness of legs(radiculopathy), death.    Consent obtained from: wife   Procedure Note The patient was prepped and draped, and using sterile technique a 20 gauge quinke spinal needle was inserted in the L4-5 space, no return. L3-4 also tried - no fluid return. Procedure aborted after multiple failed attempts for fluid return.   Patient tolerated the procedure well and blood loss was minimal. Will request fluoroscopic guided LP   -- Eligio Lav, MD Neurologist [facility] Pager: [phone]

## 2024-08-05 NOTE — Procedures (Signed)
 PROCEDURE SUMMARY:  Unsuccessful fluoroscopic guided lumbar puncture. Patient was not alert, but continuously moved throughout procedure, making LP difficult to achieve. Recommend light sedation for repeat.  No immediate complications.  Pt did not appear to tolerated poorly.   EBL = none  Please see full dictation in imaging section of Epic for procedure details.

## 2024-08-05 NOTE — ED Notes (Signed)
 Notified provider, Cheryle, MD regarding patients neurological status and snoring respirations. Requested MD to evaluate patient if not done so already.

## 2024-08-05 NOTE — ED Notes (Signed)
 Notified admitting provider team of patient rectal temp of 101.0

## 2024-08-05 NOTE — ED Notes (Signed)
 Family updated as to patient's status.

## 2024-08-05 NOTE — Evaluation (Signed)
 Physical Therapy Evaluation Patient Details Name: Bradley Hansen MRN: [id number] DOB: 01/06/1951 Today's Date: 08/05/2024  History of Present Illness  73 y.o. male presents to [facility] 08/04/24 with expressive aphasia and R sided weakness. MRI brain pending. EEG suggestive of moderate diffuse encephalopathy. No pertinent PMHx  Clinical Impression  History taken from pt's wife as pt was obtunded and unarousable during the session. Noted withdrawal from cold wash cloth on the face with pt grimacing. PTA, pt was independent for mobility with no AD. Pt required TotalAx2 for all mobility with TotalA to maintain seated balance. Multi-directional sway with no righting attempts. Non-purposeful movement noted in B hands with pt keeping arms in flexion posturing with resistance to movement. Pt has 24/7 level of assist available at home. Recommending post-acute rehab >3hrs to maximize rehab potential when medically appropriate. Pt would benefit from acute skilled PT with current functional limitations listed below (see PT Problem List). Acute PT to follow.         If plan is discharge home, recommend the following: A lot of help with walking and/or transfers;A lot of help with bathing/dressing/bathroom;Assistance with cooking/housework;Direct supervision/assist for medications management;Direct supervision/assist for financial management;Assist for transportation;Help with stairs or ramp for entrance   Can travel by private vehicle    No    Equipment Recommendations Other (comment) (TBD with progress)  Recommendations for Other Services  Rehab consult (when medically appropriate)    Functional Status Assessment Patient has had a recent decline in their functional status and demonstrates the ability to make significant improvements in function in a reasonable and predictable amount of time.     Precautions / Restrictions Precautions Precautions: Fall Recall of Precautions/Restrictions:  Impaired Restrictions Weight Bearing Restrictions Per Provider Order: No      Mobility  Bed Mobility Overal bed mobility: Needs Assistance Bed Mobility: Rolling, Sidelying to Sit, Sit to Sidelying Rolling: Total assist, +2 for physical assistance, +2 for safety/equipment Sidelying to sit: Total assist, +2 for physical assistance, +2 for safety/equipment    Sit to sidelying: Total assist, +2 for physical assistance, +2 for safety/equipment General bed mobility comments: TotalAx2 for all aspects    Transfers      General transfer comment: deferred    Modified Rankin (Stroke Patients Only) Modified Rankin (Stroke Patients Only) Pre-Morbid Rankin Score: No symptoms Modified Rankin: Severe disability     Balance Overall balance assessment: Needs assistance Sitting-balance support: No upper extremity supported, Feet unsupported Sitting balance-Leahy Scale: Zero Sitting balance - Comments: TotalA with pt swaying in all directions, no righting response Postural control: Posterior lean, Right lateral lean, Left lateral lean, Other (comment) (anterior)       Pertinent Vitals/Pain Pain Assessment Pain Assessment: CPOT Facial Expression: Relaxed, neutral Body Movements: Protection Muscle Tension: Tense, rigid Compliance with ventilator (intubated pts.): N/A Vocalization (extubated pts.): N/A CPOT Total: 2 Pain Intervention(s): Limited activity within patient's tolerance, Monitored during session, Repositioned    Home Living Family/patient expects to be discharged to:: Private residence Living Arrangements: Spouse/significant other Available Help at Discharge: Family;Available 24 hours/day Type of Home: House Home Access: Stairs to enter Entrance Stairs-Rails: None Entrance Stairs-Number of Steps: 2   Home Layout: One level Home Equipment: None      Prior Function Prior Level of Function : Independent/Modified Independent  Mobility Comments: no AD, indep ADLs  Comments: indep, retired     Extremity/Trunk Assessment   Upper Extremity Assessment Upper Extremity Assessment: Defer to OT evaluation    Lower Extremity Assessment Lower Extremity  Assessment: Difficult to assess due to impaired cognition (WFL PROM, no spontaneous movement noted of LE's)    Cervical / Trunk Assessment Cervical / Trunk Assessment: Normal  Communication   Communication Communication: Impaired Factors Affecting Communication: Other (comment) (unarousable)    Cognition Arousal: Stuporous Behavior During Therapy:  (unarousable)   PT - Cognitive impairments: Difficult to assess Difficult to assess due to: Level of arousal    PT - Cognition Comments: unable to arouse with pt keeping eyes closed during eval Following commands: Impaired Following commands impaired:  (not following commands)     Cueing Cueing Techniques: Verbal cues, Tactile cues     General Comments General comments (skin integrity, edema, etc.): Wife present and supportive during session     PT Assessment Patient needs continued PT services  PT Problem List Decreased strength;Decreased range of motion;Decreased activity tolerance;Decreased balance;Decreased mobility;Decreased cognition;Decreased knowledge of use of DME;Decreased safety awareness       PT Treatment Interventions DME instruction;Gait training;Functional mobility training;Stair training;Therapeutic activities;Therapeutic exercise;Balance training;Neuromuscular re-education;Patient/family education;Wheelchair mobility training    PT Goals (Current goals can be found in the Care Plan section)  Acute Rehab PT Goals Patient Stated Goal: unable to state PT Goal Formulation: Patient unable to participate in goal setting Time For Goal Achievement: 08/19/24 Potential to Achieve Goals: Fair    Frequency Min 2X/week     Co-evaluation PT/OT/SLP Co-Evaluation/Treatment: Yes Reason for Co-Treatment: Complexity of the patient's  impairments (multi-system involvement);Necessary to address cognition/behavior during functional activity;For patient/therapist safety;To address functional/ADL transfers PT goals addressed during session: Mobility/safety with mobility;Balance         AM-PAC PT 6 Clicks Mobility  Outcome Measure Help needed turning from your back to your side while in a flat bed without using bedrails?: Total Help needed moving from lying on your back to sitting on the side of a flat bed without using bedrails?: Total Help needed moving to and from a bed to a chair (including a wheelchair)?: Total Help needed standing up from a chair using your arms (e.g., wheelchair or bedside chair)?: Total Help needed to walk in hospital room?: Total Help needed climbing 3-5 steps with a railing? : Total 6 Click Score: 6    End of Session   Activity Tolerance: Patient limited by lethargy Patient left: in bed;with call bell/phone within reach;with family/visitor present Nurse Communication: Mobility status PT Visit Diagnosis: Other symptoms and signs involving the nervous system (R29.898);Other abnormalities of gait and mobility (R26.89)    Time: 9061-9040 PT Time Calculation (min) (ACUTE ONLY): 21 min   Charges:   PT Evaluation $PT Eval Moderate Complexity: 1 Mod   PT General Charges $$ ACUTE PT VISIT: 1 Visit    Kate ORN, PT, DPT Secure Chat Preferred  Rehab Office [phone]   Kate BRAVO Wendolyn 08/05/2024, 10:14 AM

## 2024-08-05 NOTE — ED Notes (Signed)
 Unable to get blood cultures x2 nb

## 2024-08-05 NOTE — ED Notes (Signed)
Provider remains at bedside performing LP

## 2024-08-05 NOTE — Progress Notes (Signed)
 Pharmacy Antibiotic Note  ABAS LEICHT is a 73 y.o. male admitted on 08/04/2024 now with concern for meningitis.  Pharmacy has been consulted for meningitis coverage with vancomycin , ceftriaxone , ampicillin  and acyclovir  dosing.  Plan: Vancomycin  1500mg  IV x 1, then 1g IV q 12h (target vancomycin  trough 15-20) Ceftriaxone  2g IV q 12h Ampicillin  2g IV q 4h Acyclovir  10mg /kg IV q 8h IVF NS@125  Monitor renal function, Cx/LP results to narrow Vancomycin  trough as indicated   Weight: 75 kg (165 lb 5.5 oz)  Temp (24hrs), Avg:99.8 F (37.7 C), Min:98.1 F (36.7 C), Max:101.3 F (38.5 C)  Recent Labs  Lab 08/04/24 2042 08/04/24 2045 08/05/24 0613  WBC 7.9  --  8.4  CREATININE 1.02 0.80 1.00    CrCl cannot be calculated (Unknown ideal weight.).    Not on File  Dorn Poot, PharmD, [facility] Clinical Pharmacist ED Pharmacist Phone # [phone] 08/05/2024 11:52 AM

## 2024-08-05 NOTE — ED Notes (Signed)
 Pt returned to ED bed as per MRI was unable to tolerate MRI GLENWOOD Mao MD notified

## 2024-08-05 NOTE — ED Notes (Signed)
 Attempted phone report to 5W x1.

## 2024-08-05 NOTE — Progress Notes (Signed)
 NEUROLOGY CONSULT FOLLOW UP NOTE   Date of service: August 05, 2024 Patient Name: Bradley Hansen MRN:  [id number] DOB:  04-Mar-1951  Interval Hx/subjective   - Initial MRI attempt failed due to patient's inability to tolerate imaging - EEG overnight suggestive of moderate diffuse encephalopathy without evidence of seizures or epileptiform discharges - MRI attempt successful this morning with limited study due to patient intolerance - Within truncated exam, no evidence of acute or subacute infarction noted on MRI brain  -Spiked fever --rectal temp 101F  Vitals   Vitals:   08/05/24 0600 08/05/24 0630 08/05/24 0649 08/05/24 0715  BP: (!) 160/100 (!) 156/91  (!) 170/134  Pulse: 94 84  93  Resp: (!) 23 (!) 34  19  Temp:   99.6 F (37.6 C)   TempSrc:   Axillary   SpO2: 100% 100%  94%  Weight:        There is no height or weight on file to calculate BMI.  Physical Exam   General: Obtunded HEENT: Normocephalic atraumatic Lungs: Clear Cardiovascular: Regular rate rhythm Neurological examination Obtunded Does not follow commands Pupils equal round reactive light, gaze is midline, does not blink to threat from either side Seems to have more increased tone on the left upper extremity and weakness to noxious stimulation withdrawal in comparison to the right.  This is different than what was seen overnight by my colleague but his exam is extremely limited by his mental status.   Medications  Current Facility-Administered Medications:    acetaminophen  (TYLENOL ) tablet 650 mg, 650 mg, Oral, Q6H PRN **OR** acetaminophen  (TYLENOL ) suppository 650 mg, 650 mg, Rectal, Q6H PRN, Howerter, Justin B, DO   aspirin  EC tablet 81 mg, 81 mg, Oral, Daily **OR** aspirin  suppository 150 mg, 150 mg, Rectal, Daily, Michaela, Aisha SQUIBB, MD   hydrALAZINE  (APRESOLINE ) injection 10 mg, 10 mg, Intravenous, Q4H PRN, Howerter, Justin B, DO   ondansetron  (ZOFRAN ) injection 4 mg, 4 mg, Intravenous, Q6H PRN,  Howerter, Justin B, DO   scopolamine  (TRANSDERM-SCOP) 1 MG/3DAYS 1.5 mg, 1 patch, Transdermal, Q72H, Howerter, Justin B, DO, 1.5 mg at 08/05/24 0137   sodium chloride  flush (NS) 0.9 % injection 3 mL, 3 mL, Intravenous, Once, Trifan, Donnice PARAS, MD  Current Outpatient Medications:    MAVYRET 100-40 MG TABS, Take 3 tablets by mouth daily., Disp: , Rfl:    rosuvastatin  (CRESTOR ) 10 MG tablet, Take 10 mg by mouth at bedtime., Disp: , Rfl:   Labs and Diagnostic Imaging  CBC:  Recent Labs  Lab 08/04/24 2042 08/04/24 2045 08/05/24 0613 08/05/24 0913 08/05/24 0915  WBC 7.9  --  8.4  --   --   NEUTROABS 7.1  --  6.9  --   --   HGB 14.2   < > 13.9 14.6 13.6  HCT 43.8   < > 40.6 43.0 40.0  MCV 93.2  --  89.4  --   --   PLT 241  --  222  --   --    < > = values in this interval not displayed.   Basic Metabolic Panel:  Lab Results  Component Value Date   NA 134 (L) 08/05/2024   K 3.5 08/05/2024   CO2 22 08/05/2024   GLUCOSE 130 (H) 08/05/2024   BUN 7 (L) 08/05/2024   CREATININE 1.00 08/05/2024   CALCIUM  9.9 08/05/2024   GFRNONAA >60 08/05/2024   Alcohol Level     Component Value Date/Time   [facility] <15 08/04/2024 2042  INR  Lab Results  Component Value Date   INR 1.0 08/04/2024   APTT  Lab Results  Component Value Date   APTT 23 (L) 08/04/2024   CT Head without contrast(Personally reviewed): 1. Question asymmetric hyperdensity involving the distal left M1 segment/left MCA bifurcation, which could reflect thrombus. Correlation with dedicated CTA recommended. No acute intracranial hemorrhage or visible acute large vessel territory infarct. 2. Aspects is 10. 3. Moderate to advanced chronic microvascular ischemic disease.  CT angio Head and Neck with contrast(Personally reviewed): 1. Negative CTA for large vessel occlusion or other emergent finding. 2. Negative CT perfusion for acute ischemia or other perfusion abnormality. 3. Atheromatous plaque at the origins of both  vertebral arteries with severe ostial stenoses. 4. Mild atheromatous change about the carotid bifurcations without hemodynamically significant stenosis. 5.  Aortic Atherosclerosis (ICD10-I70.0).  MRI Brain(Personally reviewed): 1. Limited study due to patient's inability to tolerate the exam. 2. No acute or subacute infarction on axial diffusion-weighted images.  rEEG:  This study is suggestive of moderate diffuse encephalopathy. No seizures or definite epileptiform discharges were seen throughout the recording.  Assessment   Bradley Hansen is a 73 y.o. French-speaking male with PMHx of single episode of postoperative atrial flutter following minimally invasive mitral valve repair, HTN, HLD, hepatitis C, nonischemic cardiomyopathy who presented 8/8 with right-sided weakness and difficulty speaking.  Stroke workup has been unrevealing with negative CT, CTA, MRI brain though with truncated exam due to patient intolerance.  EEG is suggestive of moderate diffuse encephalopathy without seizures or epileptiform discharges. Presented with concern for strokelike symptoms, wife reports ongoing headache for a few days preceding presentation, exam shows an obtunded individual with poor level of wakefulness and varying degrees of weakness first on the right and then on the left-I question if he has an underlying CNS infection.  Primary team has ordered blood cultures urine cultures and repeat chest x-ray.  We will proceed with LP.  Impression: Acute encephalopathy-etiology under investigation, evaluate for meningitis/encephalitis  Recommendations  Agree with obtaining urine cultures, blood cultures and chest x-ray Primary team to follow-up on that Puncture to obtain CSF for infectious etiology-attempted bedside with unsuccessful fluid return.  Will request fluoroscopy guided LP.  Please see the procedure note. In the interim broad meningitic/encephalitis coverage-ceftriaxone , vancomycin , ampicillin  and  acyclovir  Will follow. _____________________________________________________________________   Signed, Stevi W Toberman, NP [facility]  Attending Neurohospitalist Addendum Patient seen and examined with APP/Resident. Agree with the history and physical as documented above. Agree with the plan as documented, which I helped formulate. I have independently reviewed the chart, obtained history, review of systems and examined the patient.I have personally reviewed pertinent head/neck/spine imaging (CT/MRI). Please feel free to call with any questions.  -- Eligio Lav, MD Neurologist [facility] Pager: [phone]

## 2024-08-05 NOTE — Evaluation (Signed)
 Occupational Therapy Evaluation Patient Details Name: Bradley Hansen MRN: [id number] DOB: 02-04-1951 Today's Date: 08/05/2024   History of Present Illness   73 y.o. male presents to [facility] 08/04/24 with expressive aphasia and R sided weakness. MRI brain pending. EEG suggestive of moderate diffuse encephalopathy. No pertinent PMHx     Clinical Impressions Bradley Hansen was evaluated s/p the above admission list. Per his wife, he is indep at baseline. Upon evaluation the pt was limited by low LOA, unable to arousal despite maximal stim and position change, periods of apnea (SpO2 >98%), flexed posturing of BUEs, flaccid BLEs and zero sitting balance. Overall he needed total A +2 for all aspects of bed mobility, sitting and ADLs. Pt will benefit from continued acute OT services and intensive inpatient follow up therapy, >3 hours/day after discharge.      If plan is discharge home, recommend the following:   A lot of help with walking and/or transfers;Two people to help with walking and/or transfers;A lot of help with bathing/dressing/bathroom;Two people to help with bathing/dressing/bathroom;Assistance with cooking/housework;Assistance with feeding;Direct supervision/assist for medications management;Direct supervision/assist for financial management;Assist for transportation;Help with stairs or ramp for entrance;Supervision due to cognitive status     Functional Status Assessment   Patient has had a recent decline in their functional status and demonstrates the ability to make significant improvements in function in a reasonable and predictable amount of time.     Equipment Recommendations   Other (comment) (pending)     Recommendations for Other Services   Rehab consult     Precautions/Restrictions   Precautions Precautions: Fall Recall of Precautions/Restrictions: Impaired Restrictions Weight Bearing Restrictions Per Provider Order: No     Mobility Bed Mobility Overal bed mobility:  Needs Assistance Bed Mobility: Rolling, Sidelying to Sit, Sit to Sidelying Rolling: Total assist, +2 for physical assistance, +2 for safety/equipment Sidelying to sit: Total assist, +2 for physical assistance, +2 for safety/equipment     Sit to sidelying: Total assist, +2 for physical assistance, +2 for safety/equipment General bed mobility comments: TotalAx2 for all aspects    Transfers                   General transfer comment: deferred      Balance Overall balance assessment: Needs assistance Sitting-balance support: No upper extremity supported, Feet unsupported Sitting balance-Leahy Scale: Zero Sitting balance - Comments: TotalA with pt swaying in all directions, no righting response Postural control: Posterior lean, Right lateral lean, Left lateral lean, Other (comment)           ADL either performed or assessed with clinical judgement   ADL Overall ADL's : Needs assistance/impaired           General ADL Comments: total A +2 for all aspects of care     Vision Baseline Vision/History: 0 No visual deficits Additional Comments: eyes closed 100% of the session     Perception Perception: Not tested       Praxis Praxis: Not tested       Pertinent Vitals/Pain Pain Assessment Pain Assessment: Faces Faces Pain Scale: No hurt Pain Intervention(s): Monitored during session     Extremity/Trunk Assessment Upper Extremity Assessment Upper Extremity Assessment: RUE deficits/detail;LUE deficits/detail RUE Deficits / Details: flexed posturing, able to get full extension at all digits with proglonged stretching; pt eventually brought himself back into flexed posturing position. some active movmenet noted distally, not to command LUE Deficits / Details: flexed posturing, able to get full extension at all digits with proglonged stretching;  pt eventually brought himself back into flexed posturing position. some active movmenet noted distally, not to command    Lower Extremity Assessment Lower Extremity Assessment: Defer to PT evaluation   Cervical / Trunk Assessment Cervical / Trunk Assessment: Normal   Communication Communication Communication: Impaired Factors Affecting Communication: Other (comment) (unarousable)   Cognition Arousal: Stuporous Behavior During Therapy:  (unarousable)               OT - Cognition Comments: no impairments at baseline per wife                 Following commands: Impaired Following commands impaired:  (not follow commands)     Cueing  General Comments   Cueing Techniques: Verbal cues;Gestural cues;Tactile cues  Wife present and provided information           Home Living Family/patient expects to be discharged to:: Private residence Living Arrangements: Spouse/significant other Available Help at Discharge: Family;Available 24 hours/day Type of Home: House Home Access: Stairs to enter [employer] of Steps: 2 Entrance Stairs-Rails: None Home Layout: One level     Bathroom Shower/Tub: Chief Strategy Officer: Handicapped height     Home Equipment: None          Prior Functioning/Environment Prior Level of Function : Independent/Modified Independent             Mobility Comments: no AD, indep ADLs Comments: indep, retired    OT Problem List: Decreased strength;Decreased activity tolerance;Decreased range of motion;Impaired balance (sitting and/or standing);Impaired vision/perception;Decreased cognition;Decreased safety awareness;Decreased knowledge of use of DME or AE;Decreased knowledge of precautions;Impaired tone;Impaired UE functional use   OT Treatment/Interventions: Self-care/ADL training;Therapeutic exercise;DME and/or AE instruction;Therapeutic activities;Patient/family education;Balance training      OT Goals(Current goals can be found in the care plan section)   Acute Rehab OT Goals Patient Stated Goal: per wife, to eat OT Goal  Formulation: With patient Time For Goal Achievement: 08/19/24 Potential to Achieve Goals: Good ADL Goals Pt Will Perform Grooming: with mod assist Pt Will Perform Upper Body Dressing: with mod assist Additional ADL Goal #1: Pt will follow simple 1 step commands 50% of attempts Additional ADL Goal #2: Pt will maintain arousal with min cues   OT Frequency:  Min 2X/week    Co-evaluation PT/OT/SLP Co-Evaluation/Treatment: Yes Reason for Co-Treatment: Complexity of the patient's impairments (multi-system involvement);Necessary to address cognition/behavior during functional activity;For patient/therapist safety;To address functional/ADL transfers PT goals addressed during session: Mobility/safety with mobility;Balance OT goals addressed during session: ADL's and self-care;Proper use of Adaptive equipment and DME;Strengthening/ROM      AM-PAC OT 6 Clicks Daily Activity     Outcome Measure Help from another person eating meals?: Total Help from another person taking care of personal grooming?: Total Help from another person toileting, which includes using toliet, bedpan, or urinal?: Total Help from another person bathing (including washing, rinsing, drying)?: Total Help from another person to put on and taking off regular upper body clothing?: Total Help from another person to put on and taking off regular lower body clothing?: Total 6 Click Score: 6   End of Session Nurse Communication: Mobility status  Activity Tolerance: Patient limited by fatigue;Patient limited by lethargy Patient left: in bed;with call bell/phone within reach;with family/visitor present  OT Visit Diagnosis: Unsteadiness on feet (R26.81);Other abnormalities of gait and mobility (R26.89);Muscle weakness (generalized) (M62.81)                Time: 9060-9040 OT Time Calculation (min): 20 min  Charges:  OT General Charges $OT Visit: 1 Visit OT Evaluation $OT Eval Moderate Complexity: 1 Mod  Lucie Kendall,  OTR/L Acute Rehabilitation Services Office [phone] Secure Chat Communication Preferred   Lucie JONETTA Kendall 08/05/2024, 11:26 AM

## 2024-08-05 NOTE — Progress Notes (Signed)
 PROGRESS NOTE    Bradley Hansen  FMW:[id number] DOB: June 23, 1951 DOA: 08/04/2024 PCP: Pcp, No   Brief Narrative:  73 year old male with no reported significant past medical history presented with expressive aphasia and confusion.  On presentation, CT of the head showed no acute intracranial abnormality; CTA head and neck showed no evidence of large vessel occlusion or evidence of any acute ischemia or other perfusion abnormality.  Neurology was consulted and recommended EEG and MRI of brain.  Assessment & Plan:   Expressive aphasia Acute encephalopathy -Presented with acute onset of expressive aphasia and confusion starting on 08/04/2024. - On presentation, CT of the head showed no acute intracranial abnormality; CTA head and neck showed no evidence of large vessel occlusion or evidence of any acute ischemia or other perfusion abnormality.  Neurology was consulted and recommended EEG and MRI of brain. - EEG negative for any seizures. - MRI brain pending.  Continue telemetry monitoring and neurochecks. -Allow for permissive hypertension for now  Elevated blood pressures - Blood pressures are currently elevated.  Use as needed hydralazine  IV for now.  Hyponatremia - Mild.  Monitor intermittently.  Elevated ALT - Questionable cause.  Monitor   DVT prophylaxis: SCDs Code Status: Full Family Communication: Wife at bedside Disposition Plan: Status is: Observation The patient will require care spanning > 2 midnights and should be moved to inpatient because: Of severity of illness.  Patient is still confused.    Consultants: Neurology  Procedures: None  Antimicrobials: None   Subjective: Patient seen and examined at bedside.  Hardly wakes up.  Poor historian.  No fever, seizures, vomiting reported.  Objective: Vitals:   08/05/24 0600 08/05/24 0630 08/05/24 0649 08/05/24 0715  BP: (!) 160/100 (!) 156/91  (!) 170/134  Pulse: 94 84  93  Resp: (!) 23 (!) 34  19  Temp:   99.6 F  (37.6 C)   TempSrc:   Axillary   SpO2: 100% 100%  94%  Weight:       No intake or output data in the 24 hours ending 08/05/24 0739 Filed Weights   08/04/24 2043  Weight: 75 kg    Examination:  General exam: Appears calm and comfortable  Respiratory system: Bilateral decreased breath sounds at bases with intermittent tachypnea Cardiovascular system: S1 & S2 heard, Rate controlled Gastrointestinal system: Abdomen is nondistended, soft and nontender. Normal bowel sounds heard. Extremities: No cyanosis, clubbing, edema  Central nervous system: Hardly wakes up.  No focal neurological deficits. Moving extremities Skin: No rashes, lesions or ulcers Psychiatry: Could not be assessed because of mental status.  Currently not agitated.   Data Reviewed: I have personally reviewed following labs and imaging studies  CBC: Recent Labs  Lab 08/04/24 2042 08/04/24 2045  WBC 7.9  --   NEUTROABS 7.1  --   HGB 14.2 15.6  HCT 43.8 46.0  MCV 93.2  --   PLT 241  --    Basic Metabolic Panel: Recent Labs  Lab 08/04/24 2042 08/04/24 2045 08/05/24 0613  NA 134* 135 133*  K 3.8 3.7 3.8  CL 95* 95* 98  CO2 22  --  22  GLUCOSE 140* 135* 130*  BUN 6* 6* 7*  CREATININE 1.02 0.80 1.00  CALCIUM  9.8  --  9.9  MG 1.7  --  1.8   GFR: CrCl cannot be calculated (Unknown ideal weight.). Liver Function Tests: Recent Labs  Lab 08/04/24 2042 08/05/24 0613  AST 35 39  ALT 38 47*  ALKPHOS 78  72  BILITOT 1.1 1.2  PROT 8.8* 8.3*  ALBUMIN 4.2 4.0   No results for input(s): LIPASE, AMYLASE in the last 168 hours. No results for input(s): AMMONIA in the last 168 hours. Coagulation Profile: Recent Labs  Lab 08/04/24 2042  INR 1.0   Cardiac Enzymes: No results for input(s): CKTOTAL, CKMB, CKMBINDEX, TROPONINI in the last 168 hours. BNP (last 3 results) No results for input(s): PROBNP in the last 8760 hours. HbA1C: No results for input(s): HGBA1C in the last 72  hours. CBG: Recent Labs  Lab 08/04/24 2040  GLUCAP 135*   Lipid Profile: No results for input(s): CHOL, HDL, LDLCALC, TRIG, CHOLHDL, LDLDIRECT in the last 72 hours. Thyroid Function Tests: No results for input(s): TSH, T4TOTAL, FREET4, T3FREE, THYROIDAB in the last 72 hours. Anemia Panel: No results for input(s): VITAMINB12, FOLATE, FERRITIN, TIBC, IRON, RETICCTPCT in the last 72 hours. Sepsis Labs: No results for input(s): PROCALCITON, LATICACIDVEN in the last 168 hours.  No results found for this or any previous visit (from the past 240 hours).       Radiology Studies: EEG adult Result Date: 08/05/2024 Shelton Arlin KIDD, MD     08/05/2024  6:29 AM Patient Name: JIBRI SCHRIEFER MRN: [id number] Epilepsy Attending: Arlin KIDD Shelton Referring Physician/Provider: Michaela Aisha SQUIBB, MD Date: 08/05/2024 Duration: 30.35 mins Patient history:  73 y.o. M who presents with right-sided weakness and difficulty speaking. EEG to evaluate for seizure Level of alertness: comatose/ lethargic AEDs during EEG study: None Technical aspects: This EEG study was done with scalp electrodes positioned according to the 10-20 International system of electrode placement. Electrical activity was reviewed with band pass filter of 1-70Hz , sensitivity of 7 uV/mm, display speed of 34mm/sec with a 60Hz  notched filter applied as appropriate. EEG data were recorded continuously and digitally stored.  Video monitoring was available and reviewed as appropriate. Description: EEG showed continuous generalized 3 to 6 Hz theta-delta slowing, at times with triphasic morphology. Hyperventilation and photic stimulation were not performed.   ABNORMALITY - Continuous slow, generalized IMPRESSION: This study is suggestive of moderate diffuse encephalopathy. No seizures or definite epileptiform discharges were seen throughout the recording. Arlin KIDD Shelton   DG Chest Port 1 View Result Date:  08/05/2024 CLINICAL DATA:  Shortness of breath EXAM: PORTABLE CHEST 1 VIEW COMPARISON:  07/15/2023 FINDINGS: Heart is mildly enlarged. Postsurgical changes are again seen. Lungs are well aerated without focal infiltrate or sizable effusion. No bony abnormality is seen. IMPRESSION: No acute abnormality noted. Electronically Signed   By: Oneil Devonshire M.D.   On: 08/05/2024 01:47   CT ANGIO HEAD NECK W WO CM W PERF (CODE STROKE) Result Date: 08/04/2024 CLINICAL DATA:  Initial evaluation for acute neuro deficit, stroke. EXAM: CT ANGIOGRAPHY HEAD AND NECK CT PERFUSION BRAIN TECHNIQUE: Multidetector CT imaging of the head and neck was performed using the standard protocol during bolus administration of intravenous contrast. Multiplanar CT image reconstructions and MIPs were obtained to evaluate the vascular anatomy. Carotid stenosis measurements (when applicable) are obtained utilizing NASCET criteria, using the distal internal carotid diameter as the denominator. Multiphase CT imaging of the brain was performed following IV bolus contrast injection. Subsequent parametric perfusion maps were calculated using RAPID software. RADIATION DOSE REDUCTION: This exam was performed according to the departmental dose-optimization program which includes automated exposure control, adjustment of the mA and/or kV according to patient size and/or use of iterative reconstruction technique. CONTRAST:  OMNIPAQUE  IOHEXOL  350 MG/ML SOLN COMPARISON:  Head CT from  earlier the same day. FINDINGS: CTA NECK FINDINGS Aortic arch: Visualized arch which within normal limits for caliber with standard branch pattern. Aortic atherosclerosis. No significant stenosis about the origin the great vessels. Right carotid system: Right common and internal carotid arteries are patent without dissection. Mild eccentric plaque about the right carotid bulb without hemodynamically significant stenosis. Left carotid system: Left common and internal carotid  arteries are patent without dissection. Mild atheromatous change about the left carotid bulb without hemodynamically significant graded 50% stenosis. Vertebral arteries: Both vertebral arteries arise from subclavian arteries. Atheromatous plaque at the origins of both vertebral arteries with severe ostial stenoses. Vertebral arteries patent distally without stenosis or dissection. Skeleton: No worrisome osseous lesions. Mild-to-moderate spondylosis at C5-6 and C6-7. Other neck: No other acute finding. Upper chest: No other acute finding. Review of the MIP images confirms the above findings CTA HEAD FINDINGS Anterior circulation: Both internal carotid arteries are patent to the termini without significant stenosis. A1 segments patent bilaterally. Normal anterior communicating artery complex. Anterior cerebral arteries patent without significant stenosis. No M1 stenosis or occlusion. Distal MCA branches perfused and symmetric. Posterior circulation: Both V4 segments patent without stenosis. Both PICA patent. Basilar patent without stenosis. Superior cerebellar arteries patent bilaterally. Right PCA supplied via the basilar as well as a small right posterior communicating artery. Fetal type origin left PCA. Both PCAs patent without significant stenosis. Venous sinuses: Grossly patent allowing for timing the contrast bolus. Anatomic variants: As above.  No aneurysm. Review of the MIP images confirms the above findings CT Brain Perfusion Findings: ASPECTS: 10 CBF (<30%) Volume: 0mL Perfusion (Tmax>6.0s) volume: 10mL Mismatch Volume: 10mL Infarction Location:Negative CT perfusion for acute core infarct. Apparent 10 mL area of delayed perfusion at the right parieto-occipital region, suspected be related to the fetal type left PCA. No other convincing perfusion abnormality. IMPRESSION: 1. Negative CTA for large vessel occlusion or other emergent finding. 2. Negative CT perfusion for acute ischemia or other perfusion  abnormality. 3. Atheromatous plaque at the origins of both vertebral arteries with severe ostial stenoses. 4. Mild atheromatous change about the carotid bifurcations without hemodynamically significant stenosis. 5.  Aortic Atherosclerosis (ICD10-I70.0). These results were communicated to Dr. Michaela at 9:17 pm on 08/04/2024 by text page via the [facility] messaging system. Electronically Signed   By: Morene Hoard M.D.   On: 08/04/2024 21:18   CT HEAD CODE STROKE WO CONTRAST Result Date: 08/04/2024 CLINICAL DATA:  Code stroke. A shin initial evaluation for acute neuro deficit, stroke. EXAM: CT HEAD WITHOUT CONTRAST TECHNIQUE: Contiguous axial images were obtained from the base of the skull through the vertex without intravenous contrast. RADIATION DOSE REDUCTION: This exam was performed according to the departmental dose-optimization program which includes automated exposure control, adjustment of the mA and/or kV according to patient size and/or use of iterative reconstruction technique. COMPARISON:  None Available. FINDINGS: Brain: Cerebral volume within normal limits. Patchy hypodensity involving the supratentorial cerebral white matter, most characteristic of chronic small vessel ischemic disease, moderate to advanced in nature. No acute intracranial hemorrhage. No visible acute large vessel territory infarct. No mass lesion or midline shift. Mild ventricular prominence without hydrocephalus. No extra-axial fluid collection. Vascular: There is question of an asymmetric hyperdensity involving the distal left M1 segment/left MCA bifurcation (series 6, image 45). Skull: Scalp soft tissues within normal limits.  Calvarium intact. Sinuses/Orbits: Globes and orbital soft tissues demonstrate no acute finding. Mild chronic mucosal thickening present about the ethmoidal air cells and maxillary sinuses. Paranasal sinuses are  otherwise largely clear. No significant mastoid effusion. Other: None. ASPECTS [facility]  Stroke Program Early CT Score) - Ganglionic level infarction (caudate, lentiform nuclei, internal capsule, insula, M1-M3 cortex): 7 - Supraganglionic infarction (M4-M6 cortex): 3 Total score (0-10 with 10 being normal): 10 IMPRESSION: 1. Question asymmetric hyperdensity involving the distal left M1 segment/left MCA bifurcation, which could reflect thrombus. Correlation with dedicated CTA recommended. No acute intracranial hemorrhage or visible acute large vessel territory infarct. 2. Aspects is 10. 3. Moderate to advanced chronic microvascular ischemic disease. These results were communicated to Dr. Michaela at 8:57 pm on 08/04/2024 by text page via the [facility] messaging system. Electronically Signed   By: Morene Hoard M.D.   On: 08/04/2024 21:00        Scheduled Meds:  aspirin  EC  81 mg Oral Daily   Or   aspirin   150 mg Rectal Daily   LORazepam   2 mg Intravenous Once   scopolamine   1 patch Transdermal Q72H   sodium chloride  flush  3 mL Intravenous Once   Continuous Infusions:        Bradley Mao, MD [facility] 08/05/2024, 7:39 AM

## 2024-08-05 NOTE — ED Notes (Signed)
 Phlebotomy at bedside for attempt to get blood cultures.

## 2024-08-05 NOTE — Progress Notes (Signed)
 SLP Cancellation Note  Patient Details Name: Bradley Hansen MRN: [id number] DOB: September 18, 1951   Cancelled treatment:       Reason Eval/Treat Not Completed: Fatigue/lethargy limiting ability to participate. RN reports pt very lethargic and this SLP unable to arouse/improve alertness for safe PO intake. Discussed with wife in room who was in agreement. Will f/u for completion of bedside swallow eval when pt more alert.    Wilder Kin, MA, CCC-SLP Acute Rehabilitation Services Office Number: [phone]  OSAZE HUBBERT 08/05/2024, 8:45 AM

## 2024-08-05 NOTE — ED Notes (Signed)
 To MRI

## 2024-08-05 NOTE — ED Notes (Signed)
 Still attempting to get BC's at this time.

## 2024-08-05 NOTE — ED Notes (Signed)
 Assumed care of pt at this time. Wife at bedside, call light at reach, pt is resting comfortably

## 2024-08-05 NOTE — ED Notes (Signed)
 Patient is resting comfortably.

## 2024-08-05 NOTE — ED Notes (Signed)
 ED report received from ED paramedic. Patient is currently in MRI.

## 2024-08-05 NOTE — Progress Notes (Signed)
 EEG complete - results pending. Difficult setup, assisted by NT.

## 2024-08-05 NOTE — ED Notes (Signed)
 Retimed ampicillin  orders due to late administration due to blood cultures being extremely difficult to obtain. Only one set was able to be obtained. MD Alekh aware.

## 2024-08-05 NOTE — ED Notes (Signed)
 Per MD, do not hang abx until blood cultures are obtained. Currently LP in process. Will attempt blood cultures when LP is complete.

## 2024-08-06 ENCOUNTER — Inpatient Hospital Stay (HOSPITAL_COMMUNITY)

## 2024-08-06 DIAGNOSIS — R4701 Aphasia: Secondary | ICD-10-CM | POA: Diagnosis not present

## 2024-08-06 DIAGNOSIS — G9340 Encephalopathy, unspecified: Secondary | ICD-10-CM | POA: Diagnosis not present

## 2024-08-06 LAB — CSF CELL COUNT WITH DIFFERENTIAL
RBC Count, CSF: 1 /mm3 — ABNORMAL HIGH
RBC Count, CSF: 4 /mm3 — ABNORMAL HIGH
Tube #: 4
Tube #: 1
WBC, CSF: 2 /mm3 (ref 0–5)
WBC, CSF: 3 /mm3 (ref 0–5)

## 2024-08-06 LAB — MENINGITIS/ENCEPHALITIS PANEL (CSF)

## 2024-08-06 LAB — CBC WITH DIFFERENTIAL/PLATELET
Abs Immature Granulocytes: 0.01 K/uL (ref 0.00–0.07)
Basophils Absolute: 0 K/uL (ref 0.0–0.1)
Basophils Relative: 0 %
Eosinophils Absolute: 0 K/uL (ref 0.0–0.5)
Eosinophils Relative: 0 %
HCT: 39.8 % (ref 39.0–52.0)
Hemoglobin: 13.5 g/dL (ref 13.0–17.0)
Immature Granulocytes: 0 %
Lymphocytes Relative: 21 %
Lymphs Abs: 1.3 K/uL (ref 0.7–4.0)
MCH: 30.1 pg (ref 26.0–34.0)
MCHC: 33.9 g/dL (ref 30.0–36.0)
MCV: 88.6 fL (ref 80.0–100.0)
Monocytes Absolute: 0.8 K/uL (ref 0.1–1.0)
Monocytes Relative: 13 %
Neutro Abs: 4 K/uL (ref 1.7–7.7)
Neutrophils Relative %: 66 %
Platelets: 214 K/uL (ref 150–400)
RBC: 4.49 MIL/uL (ref 4.22–5.81)
RDW: 11.3 % — ABNORMAL LOW (ref 11.5–15.5)
WBC: 6.1 K/uL (ref 4.0–10.5)
nRBC: 0 % (ref 0.0–0.2)

## 2024-08-06 LAB — COMPREHENSIVE METABOLIC PANEL WITH GFR
ALT: 63 U/L — ABNORMAL HIGH (ref 0–44)
AST: 48 U/L — ABNORMAL HIGH (ref 15–41)
Albumin: 3.6 g/dL (ref 3.5–5.0)
Alkaline Phosphatase: 67 U/L (ref 38–126)
Anion gap: 14 (ref 5–15)
BUN: 8 mg/dL (ref 8–23)
CO2: 23 mmol/L (ref 22–32)
Calcium: 9.5 mg/dL (ref 8.9–10.3)
Chloride: 99 mmol/L (ref 98–111)
Creatinine, Ser: 0.93 mg/dL (ref 0.61–1.24)
GFR, Estimated: >60 mL/min (ref >60)
Glucose, Bld: 110 mg/dL — ABNORMAL HIGH (ref 70–99)
Potassium: 3.2 mmol/L — ABNORMAL LOW (ref 3.5–5.1)
Sodium: 136 mmol/L (ref 135–145)
Total Bilirubin: 1.3 mg/dL — ABNORMAL HIGH (ref 0.0–1.2)
Total Protein: 7.7 g/dL (ref 6.5–8.1)

## 2024-08-06 LAB — PROCALCITONIN: Procalcitonin: <0.1 ng/mL

## 2024-08-06 LAB — PROTEIN AND GLUCOSE, CSF
Glucose, CSF: 79 mg/dL — ABNORMAL HIGH (ref 40–70)
Total  Protein, CSF: 26 mg/dL (ref 15–45)

## 2024-08-06 LAB — AMMONIA: Ammonia: 20 umol/L (ref 9–35)

## 2024-08-06 LAB — C-REACTIVE PROTEIN: CRP: 1.4 mg/dL — ABNORMAL HIGH (ref <1.0)

## 2024-08-06 MED ORDER — POTASSIUM CHLORIDE 2 MEQ/ML IV SOLN
INTRAVENOUS | Status: DC
  Filled 2024-08-06 (×2): qty 1000

## 2024-08-06 MED ORDER — LIDOCAINE 1 % OPTIME INJ - NO CHARGE
5.0000 mL | Freq: Once | INTRAMUSCULAR | Status: AC
  Administered 2024-08-06: 5 mL via INTRADERMAL

## 2024-08-06 MED ORDER — MIDAZOLAM HCL 2 MG/2ML IJ SOLN
2.0000 mg | Freq: Once | INTRAMUSCULAR | Status: DC | PRN
  Administered 2024-08-06: 2 mg via INTRAVENOUS
  Filled 2024-08-06 (×2): qty 2

## 2024-08-06 MED ORDER — HALOPERIDOL LACTATE 5 MG/ML IJ SOLN
2.0000 mg | Freq: Four times a day (QID) | INTRAMUSCULAR | Status: DC | PRN
  Administered 2024-08-06 – 2024-08-09 (×3): 2 mg via INTRAVENOUS
  Filled 2024-08-06 (×3): qty 1

## 2024-08-06 MED ORDER — MIDAZOLAM HCL 2 MG/2ML IJ SOLN
2.0000 mg | Freq: Once | INTRAMUSCULAR | Status: AC | PRN
  Administered 2024-08-06: 2 mg via INTRAVENOUS

## 2024-08-06 NOTE — Progress Notes (Signed)
 SLP Cancellation Note  Patient Details Name: Bradley Hansen MRN: [id number] DOB: 1951-05-17   Cancelled treatment:       Reason Eval/Treat Not Completed: Fatigue/lethargy limiting ability to participate  Per RN patient too lethargic to safely participate in swallowing evaluation.  ST will continue efforts.   Eleanor Eagles, MA, CCC-SLP Acute Rehab SLP [phone]  [name] 08/06/2024, 8:25 AM

## 2024-08-06 NOTE — TOC Initial Note (Signed)
 Transition of Care [facility]) - Initial/Assessment Note    Patient Details  Name: Bradley Hansen MRN: [id number] Date of Birth: [birth year]  [facility] CM/SW Contact:    Marval Gell, RN Phone Number: 08/06/2024, 7:46 AM  Clinical Narrative:                  Per chart review, Patient admitted with aphasia, encephalopathy, continues workup. ICM will continue to follow for DC planning  Expected Discharge Plan: Skilled Nursing Facility Barriers to Discharge: Continued Medical Work up   Patient Goals and CMS Choice            Expected Discharge Plan and Services                                              Prior Living Arrangements/Services                       Activities of Daily Living      Permission Sought/Granted                  Emotional Assessment              Admission diagnosis:  Altered mental status [R41.82] Expressive aphasia [R47.01] Altered mental status, unspecified altered mental status type [R41.82] Patient Active Problem List   Diagnosis Date Noted   Acute encephalopathy 08/05/2024   Altered mental status 08/05/2024   Expressive aphasia 08/04/2024   PCP:  Pcp, No Pharmacy:  No Pharmacies Listed    Social Drivers of Health (SDOH) Social History: SDOH Screenings   Food Insecurity: No Food Insecurity (08/06/2024)  Housing: Low Risk  (08/06/2024)  Transportation Needs: No Transportation Needs (08/06/2024)  Utilities: Not At Risk (08/06/2024)  Social Connections: Unknown (08/06/2024)   SDOH Interventions:     Readmission Risk Interventions     No data to display

## 2024-08-06 NOTE — Progress Notes (Signed)
 Inpatient Rehab Admissions Coordinator Note:   Per therapy patient was screened for CIR candidacy by Danine Hor SHAUNNA Yvone Cohens, CCC-SLP. Note workup is ongoing. I will place an order for rehab consult for full assessment, per our protocol.  Please contact me any with questions.SABRA Tinnie Yvone Cohens, MS, CCC-SLP Admissions Coordinator [phone] 08/06/24 3:56 PM

## 2024-08-06 NOTE — Plan of Care (Signed)

## 2024-08-06 NOTE — Progress Notes (Signed)
 Same-day progress note CSF collected via fluoroscopy guided LP.  Appreciate radiology for completing the LP in this challenging patient. CSF preliminary results unremarkable Patient still appears to be encephalopathic on exam. Continue broad-spectrum antibiotics for now Will likely order LTM EEG Neurology will follow  -- Eligio Lav, MD Neurologist [facility]

## 2024-08-06 NOTE — Progress Notes (Signed)
 LTM EEG hooked up and running - no initial skin breakdown - push button tested - [facility] monitoring.

## 2024-08-06 NOTE — Procedures (Signed)
 PROCEDURE SUMMARY:  Successful fluoroscopic guided lumbar puncture. No immediate complications.  Pt tolerated well.   EBL = none  Please see full dictation in imaging section of Epic for procedure details.

## 2024-08-06 NOTE — Progress Notes (Addendum)
 PROGRESS NOTE                                                                                                                                                                                                             Patient Demographics:    Bradley Hansen, is a 73 y.o. male, DOB - 1951-08-12, FMW:[id number]  Outpatient Primary MD for the patient is Pcp, No    LOS - 1  Admit date - 08/04/2024    Chief Complaint  Patient presents with   Code Stroke       Brief Narrative (HPI from H&P)    73 y.o. male with no reported significant past medical history, per patient's wife, patient has no known significant past medical history, she reports that around noon today, the patient became acutely confused and aphasic, prompting the patient be brought to [facility] emergency department for further evaluation management [facility] did not note any tonic-clonic activity nor any tongue biting or loss of bowel/bladder function.   Subjective:    Bradley Hansen today in bed somewhat confused, talk to him through his wife denies any headache or chest pain, no shortness of breath.   Assessment  & Plan :    Acute onset of metabolic encephalopathy and expressive aphasia.  No significant past medical history, no focal deficits, wife further reveals that he might have had some headache for the last 2 to 3 days, neurology on board, MRI head, CTA head and neck and EEG unremarkable, 2 attempts of LP on 08/05/2024 have failed, for now on empiric coverage for meningitis, some clinical improvement, third LP attempt on 08/06/2024 by IR to be done.  Follow cultures, CSF studies, check respiratory viral panel, neuro input appreciated.  PT OT and speech to evaluate as well.  Questionable pneumonia on chest x-ray at the time of admission, SLP to evaluate, gentle IV fluids, antibiotics for above will suffice.  Dyslipidemia.  On statin at home will resume once acute  conditions stabilize.  Nonspecific CTA findings in the vertebral arteries.  Secondary prevention per PCP, statin and aspirin  in the outpatient setting when able to.      Condition - Extremely Guarded  Family Communication  : Wife bedside on 08/06/2024  Code Status : Full code  Consults  : Neurology, IR  PUD Prophylaxis :     Procedures  :  LP third attempt 08/06/2024 by IR.    MRI brain, EEG, CTA head and neck.  Nonacute.      Disposition Plan  :    Status is: Inpatient   DVT Prophylaxis  :    SCDs Start: 08/04/24 2243    Lab Results  Component Value Date   PLT 214 08/06/2024    Diet :  Diet Order             Diet NPO time specified  Diet effective now                    Inpatient Medications  Scheduled Meds:  aspirin  EC  81 mg Oral Daily   Or   aspirin   150 mg Rectal Daily   scopolamine   1 patch Transdermal Q72H   sodium chloride  flush  3 mL Intravenous Once   Continuous Infusions:  sodium chloride  125 mL/hr at 08/05/24 2330   acyclovir  750 mg (08/05/24 2334)   ampicillin  (OMNIPEN) IV 2 g (08/06/24 0514)   cefTRIAXone  (ROCEPHIN )  IV 2 g (08/06/24 0216)   vancomycin  1,000 mg (08/06/24 0308)   PRN Meds:.acetaminophen  **OR** acetaminophen , hydrALAZINE , ondansetron  (ZOFRAN ) IV  Antibiotics  :    Anti-infectives (From admission, onward)    Start     Dose/Rate Route Frequency Ordered Stop   08/06/24 0100  vancomycin  (VANCOCIN ) IVPB 1000 mg/200 mL premix        1,000 mg 200 mL/hr over 60 Minutes Intravenous Every 12 hours 08/05/24 1156     08/05/24 1915  ampicillin  (OMNIPEN) 2 g in sodium chloride  0.9 % 100 mL IVPB        2 g 300 mL/hr over 20 Minutes Intravenous Every 4 hours 08/05/24 1533     08/05/24 1200  ampicillin  (OMNIPEN) 2 g in sodium chloride  0.9 % 100 mL IVPB  Status:  Discontinued        2 g 300 mL/hr over 20 Minutes Intravenous Every 4 hours 08/05/24 1119 08/05/24 1533   08/05/24 1200  acyclovir  (ZOVIRAX ) 750 mg in dextrose  5  % 250 mL IVPB        10 mg/kg  75 kg 265 mL/hr over 60 Minutes Intravenous Every 8 hours 08/05/24 1154     08/05/24 1130  cefTRIAXone  (ROCEPHIN ) 2 g in sodium chloride  0.9 % 100 mL IVPB        2 g 200 mL/hr over 30 Minutes Intravenous Every 12 hours 08/05/24 1119     08/05/24 1130  vancomycin  (VANCOREADY) IVPB 1500 mg/300 mL        1,500 mg 150 mL/hr over 120 Minutes Intravenous  Once 08/05/24 1119 08/05/24 1754         Objective:   Vitals:   08/05/24 2027 08/05/24 2329 08/06/24 0500 08/06/24 0523  BP: (!) 157/92 (!) 152/85  (!) 164/99  Pulse: 88 83  88  Resp: 17 19  18   Temp: 98.9 F (37.2 C) 98.4 F (36.9 C)  98 F (36.7 C)  TempSrc: Axillary Axillary  Axillary  SpO2: 98% 99%  98%  Weight:   75.3 kg     Wt Readings from Last 3 Encounters:  08/06/24 75.3 kg     Intake/Output Summary (Last 24 hours) at 08/06/2024 0740 Last data filed at 08/06/2024 0308 Gross per 24 hour  Intake 1773.41 ml  Output 300 ml  Net 1473.41 ml     Physical Exam  Awake Alert, No new F.N deficits, Normal affect [place].AT,PERRAL Supple Neck, No  JVD,   Symmetrical Chest wall movement, Good air movement bilaterally, CTAB RRR,No Gallops,Rubs or new Murmurs,  +ve B.Sounds, Abd Soft, No tenderness,   No Cyanosis, Clubbing or edema     RN pressure injury documentation:      Data Review:    Recent Labs  Lab 08/04/24 2042 08/04/24 2045 08/05/24 0613 08/05/24 0913 08/05/24 0915 08/06/24 0715  WBC 7.9  --  8.4  --   --  6.1  HGB 14.2 15.6 13.9 14.6 13.6 13.5  HCT 43.8 46.0 40.6 43.0 40.0 39.8  PLT 241  --  222  --   --  214  MCV 93.2  --  89.4  --   --  88.6  MCH 30.2  --  30.6  --   --  30.1  MCHC 32.4  --  34.2  --   --  33.9  RDW 10.9*  --  11.1*  --   --  11.3*  LYMPHSABS 0.5*  --  1.1  --   --  1.3  MONOABS 0.2  --  0.4  --   --  0.8  EOSABS 0.0  --  0.0  --   --  0.0  BASOSABS 0.0  --  0.0  --   --  0.0    Recent Labs  Lab 08/04/24 2042 08/04/24 2045 08/05/24 0613  08/05/24 0651 08/05/24 0913 08/05/24 0915  NA 134* 135 133*  --  134* 134*  K 3.8 3.7 3.8  --  3.9 3.5  CL 95* 95* 98  --   --   --   CO2 22  --  22  --   --   --   ANIONGAP 17*  --  13  --   --   --   GLUCOSE 140* 135* 130*  --   --   --   BUN 6* 6* 7*  --   --   --   CREATININE 1.02 0.80 1.00  --   --   --   AST 35  --  39  --   --   --   ALT 38  --  47*  --   --   --   ALKPHOS 78  --  72  --   --   --   BILITOT 1.1  --  1.2  --   --   --   ALBUMIN 4.2  --  4.0  --   --   --   INR 1.0  --   --   --   --   --   TSH  --   --   --  1.023  --   --   AMMONIA  --   --   --  18  --   --   MG 1.7  --  1.8  --   --   --   CALCIUM  9.8  --  9.9  --   --   --       Recent Labs  Lab 08/04/24 2042 08/05/24 0613 08/05/24 0651  INR 1.0  --   --   TSH  --   --  1.023  AMMONIA  --   --  18  MG 1.7 1.8  --   CALCIUM  9.8 9.9  --     --------------------------------------------------------------------------------------------------------------- Lab Results  Component Value Date   CHOL 216 (H) 08/05/2024   HDL 52 08/05/2024   LDLCALC 155 (H) 08/05/2024  TRIG 45 08/05/2024   CHOLHDL 4.2 08/05/2024    No results found for: HGBA1C Recent Labs    08/05/24 0651  TSH 1.023   Recent Labs    08/05/24 0651  VITAMINB12 416   ------------------------------------------------------------------------------------------------------------------ Cardiac Enzymes No results for input(s): CKMB, TROPONINI, MYOGLOBIN in the last 168 hours.  Invalid input(s): CK  Micro Results Recent Results (from the past 240 hours)  MRSA Next Gen by PCR, Nasal     Status: None   Collection Time: 08/05/24  9:11 PM   Specimen: Nasal Mucosa; Nasal Swab  Result Value Ref Range Status   MRSA by PCR Next Gen NOT DETECTED NOT DETECTED Final    Comment: (NOTE) The GeneXpert MRSA Assay (FDA approved for NASAL specimens only), is one component of a comprehensive MRSA colonization surveillance program.  It is not intended to diagnose MRSA infection nor to guide or monitor treatment for MRSA infections. Test performance is not FDA approved in patients less than 44 years old. Performed at [facility], [address] [place], [place] [address]     Radiology Report DG Chest Port 1 View Result Date: 08/06/2024 EXAM: 1 VIEW XRAY OF THE CHEST 08/06/2024 07:22:00 AM COMPARISON: 08/05/2024 CLINICAL HISTORY: [id number] SOB (shortness of breath). Reason for exam: pt order states SOB (shortness of breath); Best obtainable images due to pt condition. FINDINGS: LUNGS AND PLEURA: Minimal airspace opacities at the lung bases are stable, likely atelectasis. No pleural effusion. No pneumothorax. HEART AND MEDIASTINUM: Mild cardiomegaly is stable. Atherosclerotic changes are present at the aortic arch. BONES AND SOFT TISSUES: No acute osseous abnormality. IMPRESSION: 1. Stable mild cardiomegaly, low lung volumes, and minimal airspace opacities at the lung bases, likely atelectasis. 2. Atherosclerotic changes at the aortic arch. Electronically signed by: Lonni Necessary MD 08/06/2024 07:28 AM EDT RP Workstation: [id number]   DG FL GUIDED LUMBAR PUNCTURE Result Date: 08/05/2024 CLINICAL DATA:  73 year old male with altered mental status, being evaluated for possible meningitis or normal pressure hydrocephalus. IR was requested for LP, with 30 cc CSF collected. EXAM: LUMBAR PUNCTURE UNDER FLUOROSCOPY PROCEDURE: An appropriate skin entry site was determined fluoroscopically. Operator donned sterile gloves and mask. Skin site was marked, then prepped with Betadine, draped in usual sterile fashion, and infiltrated locally with 1% lidocaine . A 20 gauge spinal needle advanced into the thecal sac at L5-S1, L4-L5, L3-L4 from a left interlaminar approach. Unfortunately, patient remained agitated throughout the procedure, constantly moving. No CSF returned at all 3 levels attempted. Therefore, procedure was aborted. The  needle was then removed. FLUOROSCOPY: Radiation Exposure Index (as provided by the fluoroscopic device): 18.9 mGy Kerma IMPRESSION: Unsuccessful lumbar puncture under fluoroscopy. This exam was performed by Carlin Griffon, PA-C, and was supervised and interpreted by Dr. Juliene Balder, MD. Electronically Signed   By: Juliene Balder M.D.   On: 08/05/2024 20:20   DG CHEST PORT 1 VIEW Result Date: 08/05/2024 CLINICAL DATA:  Fever. EXAM: PORTABLE CHEST 1 VIEW COMPARISON:  08/05/2024 and CT chest 07/15/2023. FINDINGS: Trachea is midline. Heart is enlarged, stable. Mild left lobe interstitial prominence. No pleural fluid. Elevated right hemidiaphragm. IMPRESSION: Left lower lobe interstitial prominence may be due to a viral pneumonia or infectious bronchiolitis. Electronically Signed   By: Newell Eke M.D.   On: 08/05/2024 12:37   MR BRAIN WO CONTRAST Result Date: 08/05/2024 EXAM: MRI BRAIN WITHOUT CONTRAST 08/05/2024 08:03:00 AM TECHNIQUE: Multiplanar multisequence MRI of the head/brain was attempted without the administration of intravenous contrast. The patient was unable  to tolerate the MRI scan despite sedation. Only axial diffusion weighted images were obtained. COMPARISON: None available. CLINICAL HISTORY: Mental status change, unknown cause. Pt was uncooperative. Second time trying per neuro request. Pt received ativan  the first time and more the second and still not a candidate for MRI. DWI sent and pt went back to room. FINDINGS: BRAIN AND VENTRICLES: No acute or subacute infarction demonstrated on diffusion-weighted images. No focal susceptibility is present on these axial weighted images. IMPRESSION: 1. Limited study due to patient's inability to tolerate the exam. 2. No acute or subacute infarction on axial diffusion-weighted images. Electronically signed by: Lonni Necessary MD 08/05/2024 08:17 AM EDT RP Workstation: [id number]   EEG adult Result Date: 08/05/2024 Shelton Arlin KIDD, MD     08/05/2024  6:29  AM Patient Name: AIDYN KELLIS MRN: [id number] Epilepsy Attending: Arlin KIDD Shelton Referring Physician/Provider: Michaela Aisha SQUIBB, MD Date: 08/05/2024 Duration: 30.35 mins Patient history:  73 y.o. M who presents with right-sided weakness and difficulty speaking. EEG to evaluate for seizure Level of alertness: comatose/ lethargic AEDs during EEG study: None Technical aspects: This EEG study was done with scalp electrodes positioned according to the 10-20 International system of electrode placement. Electrical activity was reviewed with band pass filter of 1-70Hz , sensitivity of 7 uV/mm, display speed of 30mm/sec with a 60Hz  notched filter applied as appropriate. EEG data were recorded continuously and digitally stored.  Video monitoring was available and reviewed as appropriate. Description: EEG showed continuous generalized 3 to 6 Hz theta-delta slowing, at times with triphasic morphology. Hyperventilation and photic stimulation were not performed.   ABNORMALITY - Continuous slow, generalized IMPRESSION: This study is suggestive of moderate diffuse encephalopathy. No seizures or definite epileptiform discharges were seen throughout the recording. Arlin KIDD Shelton   DG Chest Port 1 View Result Date: 08/05/2024 CLINICAL DATA:  Shortness of breath EXAM: PORTABLE CHEST 1 VIEW COMPARISON:  07/15/2023 FINDINGS: Heart is mildly enlarged. Postsurgical changes are again seen. Lungs are well aerated without focal infiltrate or sizable effusion. No bony abnormality is seen. IMPRESSION: No acute abnormality noted. Electronically Signed   By: Oneil Devonshire M.D.   On: 08/05/2024 01:47   CT ANGIO HEAD NECK W WO CM W PERF (CODE STROKE) Result Date: 08/04/2024 CLINICAL DATA:  Initial evaluation for acute neuro deficit, stroke. EXAM: CT ANGIOGRAPHY HEAD AND NECK CT PERFUSION BRAIN TECHNIQUE: Multidetector CT imaging of the head and neck was performed using the standard protocol during bolus administration of intravenous  contrast. Multiplanar CT image reconstructions and MIPs were obtained to evaluate the vascular anatomy. Carotid stenosis measurements (when applicable) are obtained utilizing NASCET criteria, using the distal internal carotid diameter as the denominator. Multiphase CT imaging of the brain was performed following IV bolus contrast injection. Subsequent parametric perfusion maps were calculated using RAPID software. RADIATION DOSE REDUCTION: This exam was performed according to the departmental dose-optimization program which includes automated exposure control, adjustment of the mA and/or kV according to patient size and/or use of iterative reconstruction technique. CONTRAST:  OMNIPAQUE  IOHEXOL  350 MG/ML SOLN COMPARISON:  Head CT from earlier the same day. FINDINGS: CTA NECK FINDINGS Aortic arch: Visualized arch which within normal limits for caliber with standard branch pattern. Aortic atherosclerosis. No significant stenosis about the origin the great vessels. Right carotid system: Right common and internal carotid arteries are patent without dissection. Mild eccentric plaque about the right carotid bulb without hemodynamically significant stenosis. Left carotid system: Left common and internal carotid arteries are patent without  dissection. Mild atheromatous change about the left carotid bulb without hemodynamically significant graded 50% stenosis. Vertebral arteries: Both vertebral arteries arise from subclavian arteries. Atheromatous plaque at the origins of both vertebral arteries with severe ostial stenoses. Vertebral arteries patent distally without stenosis or dissection. Skeleton: No worrisome osseous lesions. Mild-to-moderate spondylosis at C5-6 and C6-7. Other neck: No other acute finding. Upper chest: No other acute finding. Review of the MIP images confirms the above findings CTA HEAD FINDINGS Anterior circulation: Both internal carotid arteries are patent to the termini without significant  stenosis. A1 segments patent bilaterally. Normal anterior communicating artery complex. Anterior cerebral arteries patent without significant stenosis. No M1 stenosis or occlusion. Distal MCA branches perfused and symmetric. Posterior circulation: Both V4 segments patent without stenosis. Both PICA patent. Basilar patent without stenosis. Superior cerebellar arteries patent bilaterally. Right PCA supplied via the basilar as well as a small right posterior communicating artery. Fetal type origin left PCA. Both PCAs patent without significant stenosis. Venous sinuses: Grossly patent allowing for timing the contrast bolus. Anatomic variants: As above.  No aneurysm. Review of the MIP images confirms the above findings CT Brain Perfusion Findings: ASPECTS: 10 CBF (<30%) Volume: 0mL Perfusion (Tmax>6.0s) volume: 10mL Mismatch Volume: 10mL Infarction Location:Negative CT perfusion for acute core infarct. Apparent 10 mL area of delayed perfusion at the right parieto-occipital region, suspected be related to the fetal type left PCA. No other convincing perfusion abnormality. IMPRESSION: 1. Negative CTA for large vessel occlusion or other emergent finding. 2. Negative CT perfusion for acute ischemia or other perfusion abnormality. 3. Atheromatous plaque at the origins of both vertebral arteries with severe ostial stenoses. 4. Mild atheromatous change about the carotid bifurcations without hemodynamically significant stenosis. 5.  Aortic Atherosclerosis (ICD10-I70.0). These results were communicated to Dr. Michaela at 9:17 pm on 08/04/2024 by text page via the [facility] messaging system. Electronically Signed   By: Morene Hoard M.D.   On: 08/04/2024 21:18   CT HEAD CODE STROKE WO CONTRAST Result Date: 08/04/2024 CLINICAL DATA:  Code stroke. A shin initial evaluation for acute neuro deficit, stroke. EXAM: CT HEAD WITHOUT CONTRAST TECHNIQUE: Contiguous axial images were obtained from the base of the skull through the  vertex without intravenous contrast. RADIATION DOSE REDUCTION: This exam was performed according to the departmental dose-optimization program which includes automated exposure control, adjustment of the mA and/or kV according to patient size and/or use of iterative reconstruction technique. COMPARISON:  None Available. FINDINGS: Brain: Cerebral volume within normal limits. Patchy hypodensity involving the supratentorial cerebral white matter, most characteristic of chronic small vessel ischemic disease, moderate to advanced in nature. No acute intracranial hemorrhage. No visible acute large vessel territory infarct. No mass lesion or midline shift. Mild ventricular prominence without hydrocephalus. No extra-axial fluid collection. Vascular: There is question of an asymmetric hyperdensity involving the distal left M1 segment/left MCA bifurcation (series 6, image 45). Skull: Scalp soft tissues within normal limits.  Calvarium intact. Sinuses/Orbits: Globes and orbital soft tissues demonstrate no acute finding. Mild chronic mucosal thickening present about the ethmoidal air cells and maxillary sinuses. Paranasal sinuses are otherwise largely clear. No significant mastoid effusion. Other: None. ASPECTS [facility] Stroke Program Early CT Score) - Ganglionic level infarction (caudate, lentiform nuclei, internal capsule, insula, M1-M3 cortex): 7 - Supraganglionic infarction (M4-M6 cortex): 3 Total score (0-10 with 10 being normal): 10 IMPRESSION: 1. Question asymmetric hyperdensity involving the distal left M1 segment/left MCA bifurcation, which could reflect thrombus. Correlation with dedicated CTA recommended. No acute intracranial hemorrhage or  visible acute large vessel territory infarct. 2. Aspects is 10. 3. Moderate to advanced chronic microvascular ischemic disease. These results were communicated to Dr. Michaela at 8:57 pm on 08/04/2024 by text page via the [facility] messaging system. Electronically Signed   By:  Morene Hoard M.D.   On: 08/04/2024 21:00     Signature  -   Lavada Stank M.D on 08/06/2024 at 7:40 AM   -  To page go to [email or web address]

## 2024-08-06 NOTE — Progress Notes (Signed)
NEUROLOGY CONSULT FOLLOW UP NOTE   Date of service: August 06, 2024 Patient Name: Bradley Hansen MRN:  [id number] DOB:  May 21, 1951  Interval Hx/subjective   LP to be repeated today under fluoro. Tmax in 24 hrs in 101.3 on 8/9 at 1043 On empiric meningitis/encephalitis coverage  Vitals   Vitals:   08/05/24 2027 08/05/24 2329 08/06/24 0500 08/06/24 0523  BP: (!) 157/92 (!) 152/85  (!) 164/99  Pulse: 88 83  88  Resp: 17 19  18   Temp: 98.9 F (37.2 C) 98.4 F (36.9 C)  98 F (36.7 C)  TempSrc: Axillary Axillary  Axillary  SpO2: 98% 99%  98%  Weight:   75.3 kg     There is no height or weight on file to calculate BMI.  Physical Exam   General: NAD HEENT: Normocephalic atraumatic Lungs: Clear Cardiovascular: Regular rate rhythm Neurological examination Somewhat more awake today, restless in bed. Response to the question what is your name-she was able to tell me Mower.  He made some sentences without any evidence of dysarthria but has extremely poor attention and concentration and appears very encephalopathic Cranial nerves: Pupils appear equal and reactive, gaze is midline with no clear gaze preference or deviation, face appears grossly symmetric Motor examination reveals him moving all 4 strongly although he does not cooperate with full exam Sensation intact to touch   Medications  Current Facility-Administered Medications:    0.9 %  sodium chloride  infusion, , Intravenous, Continuous, Gaines Carrier, [facility], Last Rate: 125 mL/hr at 08/05/24 2330, New Bag at 08/05/24 2330   acetaminophen  (TYLENOL ) tablet 650 mg, 650 mg, Oral, Q6H PRN **OR** acetaminophen  (TYLENOL ) suppository 650 mg, 650 mg, Rectal, Q6H PRN, Howerter, Justin B, DO   acyclovir  (ZOVIRAX ) 750 mg in dextrose  5 % 250 mL IVPB, 10 mg/kg, Intravenous, Q8H, Gaines Carrier, RPH, Last Rate: 265 mL/hr at 08/06/24 0755, 750 mg at 08/06/24 0755   ampicillin  (OMNIPEN) 2 g in sodium chloride  0.9 % 100 mL IVPB, 2 g,  Intravenous, Q4H, Alekh, Kshitiz, MD, Last Rate: 300 mL/hr at 08/06/24 0514, 2 g at 08/06/24 0514   aspirin  EC tablet 81 mg, 81 mg, Oral, Daily **OR** aspirin  suppository 150 mg, 150 mg, Rectal, Daily, Michaela, Aisha SQUIBB, MD   cefTRIAXone  (ROCEPHIN ) 2 g in sodium chloride  0.9 % 100 mL IVPB, 2 g, Intravenous, Q12H, Gaines Carrier, RPH, Last Rate: 200 mL/hr at 08/06/24 0216, 2 g at 08/06/24 0216   hydrALAZINE  (APRESOLINE ) injection 10 mg, 10 mg, Intravenous, Q4H PRN, Howerter, Justin B, DO   ondansetron  (ZOFRAN ) injection 4 mg, 4 mg, Intravenous, Q6H PRN, Howerter, Justin B, DO   scopolamine  (TRANSDERM-SCOP) 1 MG/3DAYS 1.5 mg, 1 patch, Transdermal, Q72H, Howerter, Justin B, DO, 1.5 mg at 08/05/24 0137   sodium chloride  flush (NS) 0.9 % injection 3 mL, 3 mL, Intravenous, Once, Trifan, Donnice PARAS, MD   vancomycin  (VANCOCIN ) IVPB 1000 mg/200 mL premix, 1,000 mg, Intravenous, Q12H, Gaines Carrier, RPH, Last Rate: 200 mL/hr at 08/06/24 0308, 1,000 mg at 08/06/24 0308  Labs and Diagnostic Imaging  CBC:  Recent Labs  Lab 08/05/24 0613 08/05/24 0913 08/05/24 0915 08/06/24 0715  WBC 8.4  --   --  6.1  NEUTROABS 6.9  --   --  4.0  HGB 13.9   < > 13.6 13.5  HCT 40.6   < > 40.0 39.8  MCV 89.4  --   --  88.6  PLT 222  --   --  214   < > =  values in this interval not displayed.   Basic Metabolic Panel:  Lab Results  Component Value Date   NA 134 (L) 08/05/2024   K 3.5 08/05/2024   CO2 22 08/05/2024   GLUCOSE 130 (H) 08/05/2024   BUN 7 (L) 08/05/2024   CREATININE 1.00 08/05/2024   CALCIUM  9.9 08/05/2024   GFRNONAA >60 08/05/2024   Alcohol Level     Component Value Date/Time   ETH <15 08/04/2024 2042   INR  Lab Results  Component Value Date   INR 1.0 08/04/2024   APTT  Lab Results  Component Value Date   APTT 23 (L) 08/04/2024    TSH - 1.023 B12 - 416 Ammonia -18  Blood cultures in process UA in process  CT Head without contrast(Personally reviewed): 1. Question  asymmetric hyperdensity involving the distal left M1 segment/left MCA bifurcation, which could reflect thrombus. Correlation with dedicated CTA recommended. No acute intracranial hemorrhage or visible acute large vessel territory infarct. 2. Aspects is 10. 3. Moderate to advanced chronic microvascular ischemic disease.  CT angio Head and Neck with contrast(Personally reviewed): 1. Negative CTA for large vessel occlusion or other emergent finding. 2. Negative CT perfusion for acute ischemia or other perfusion abnormality. 3. Atheromatous plaque at the origins of both vertebral arteries with severe ostial stenoses. 4. Mild atheromatous change about the carotid bifurcations without hemodynamically significant stenosis. 5.  Aortic Atherosclerosis (ICD10-I70.0).  MRI Brain(Personally reviewed): 1. Limited study due to patient's inability to tolerate the exam. 2. No acute or subacute infarction on axial diffusion-weighted images.  rEEG:  This study is suggestive of moderate diffuse encephalopathy. No seizures or definite epileptiform discharges were seen throughout the recording.  Assessment   Bradley Hansen is a 73 y.o. French-speaking male with PMHx of single episode of postoperative atrial flutter following minimally invasive mitral valve repair, HTN, HLD, hepatitis C, nonischemic cardiomyopathy who presented 8/8 with right-sided weakness and difficulty speaking.   Stroke workup has been unrevealing with negative CT, CTA, MRI brain though with truncated exam due to patient intolerance.   EEG is suggestive of moderate diffuse encephalopathy without seizures or epileptiform discharges. Presented with concern for strokelike symptoms, wife reports ongoing headache for a few days preceding presentation, exam shows an obtunded individual with poor level of wakefulness and varying degrees of weakness first on the right and then on the left- Question if he has an underlying CNS infection, more so since  he also spiked a fever. Bedside LP unsuccessful.  Fluoroscopy LP also unsuccessful yesterday.Plan for repeat LP today by IR.   Labs today with WBC 6.1,   Impression: Acute encephalopathy-etiology under investigation, evaluate for meningitis/encephalitis  Recommendations  Agree with obtaining urine cultures, blood cultures and chest x-ray Repeat LP planned today.  Send the CSF for glucose, protein, cell count x 2 tubes, meningitis/encephalitis panel, Gram stain. In the interim broad meningitic/encephalitis coverage-ceftriaxone , vancomycin , ampicillin  and acyclovir  Will follow. Discussed with Dr. Dennise _____________________________________________________________________   Patient seen and examined by NP/APP with MD. MD to update note as needed.   Jorene Last, DNP, FNP-BC [facility] Pager: [phone]  Attending Neurohospitalist Addendum Patient seen and examined with APP/Resident. Agree with the history and physical as documented above. Agree with the plan as documented, which I helped formulate. I have independently reviewed the chart, obtained history, review of systems and examined the patient.I have personally reviewed pertinent head/neck/spine imaging (CT/MRI). Please feel free to call with any questions.  -- Eligio Lav, MD Neurologist [facility] Pager:  [phone] 

## 2024-08-07 ENCOUNTER — Inpatient Hospital Stay (HOSPITAL_COMMUNITY)

## 2024-08-07 DIAGNOSIS — I342 Nonrheumatic mitral (valve) stenosis: Secondary | ICD-10-CM | POA: Diagnosis not present

## 2024-08-07 DIAGNOSIS — R4701 Aphasia: Secondary | ICD-10-CM | POA: Diagnosis not present

## 2024-08-07 DIAGNOSIS — G9340 Encephalopathy, unspecified: Secondary | ICD-10-CM | POA: Diagnosis not present

## 2024-08-07 LAB — C-REACTIVE PROTEIN: CRP: 1.7 mg/dL — ABNORMAL HIGH (ref <1.0)

## 2024-08-07 LAB — COMPREHENSIVE METABOLIC PANEL WITH GFR
ALT: 54 U/L — ABNORMAL HIGH (ref 0–44)
AST: 40 U/L (ref 15–41)
Albumin: 3.6 g/dL (ref 3.5–5.0)
Alkaline Phosphatase: 60 U/L (ref 38–126)
Anion gap: 14 (ref 5–15)
BUN: <5 mg/dL — ABNORMAL LOW (ref 8–23)
CO2: 21 mmol/L — ABNORMAL LOW (ref 22–32)
Calcium: 9.1 mg/dL (ref 8.9–10.3)
Chloride: 99 mmol/L (ref 98–111)
Creatinine, Ser: 0.87 mg/dL (ref 0.61–1.24)
GFR, Estimated: >60 mL/min (ref >60)
Glucose, Bld: 111 mg/dL — ABNORMAL HIGH (ref 70–99)
Potassium: 3.3 mmol/L — ABNORMAL LOW (ref 3.5–5.1)
Sodium: 134 mmol/L — ABNORMAL LOW (ref 135–145)
Total Bilirubin: 1.3 mg/dL — ABNORMAL HIGH (ref 0.0–1.2)
Total Protein: 7.6 g/dL (ref 6.5–8.1)

## 2024-08-07 LAB — RESPIRATORY PANEL BY PCR

## 2024-08-07 LAB — ECHOCARDIOGRAM COMPLETE
Area-P 1/2: 5.14 cm2
MV VTI: 2.59 cm2
S' Lateral: 2.9 cm
Single Plane A4C EF: 52.5 %
Weight: 2680.79 [oz_av]

## 2024-08-07 LAB — CBC WITH DIFFERENTIAL/PLATELET
Abs Immature Granulocytes: 0.01 K/uL (ref 0.00–0.07)
Basophils Absolute: 0 K/uL (ref 0.0–0.1)
Basophils Relative: 1 %
Eosinophils Absolute: 0 K/uL (ref 0.0–0.5)
Eosinophils Relative: 0 %
HCT: 37.4 % — ABNORMAL LOW (ref 39.0–52.0)
Hemoglobin: 13 g/dL (ref 13.0–17.0)
Immature Granulocytes: 0 %
Lymphocytes Relative: 16 %
Lymphs Abs: 1 K/uL (ref 0.7–4.0)
MCH: 30.7 pg (ref 26.0–34.0)
MCHC: 34.8 g/dL (ref 30.0–36.0)
MCV: 88.2 fL (ref 80.0–100.0)
Monocytes Absolute: 0.7 K/uL (ref 0.1–1.0)
Monocytes Relative: 11 %
Neutro Abs: 4.4 K/uL (ref 1.7–7.7)
Neutrophils Relative %: 72 %
Platelets: 236 K/uL (ref 150–400)
RBC: 4.24 MIL/uL (ref 4.22–5.81)
RDW: 11 % — ABNORMAL LOW (ref 11.5–15.5)
WBC: 6.2 K/uL (ref 4.0–10.5)
nRBC: 0 % (ref 0.0–0.2)

## 2024-08-07 LAB — HEMOGLOBIN A1C
Hgb A1c MFr Bld: 4.9 % (ref 4.8–5.6)
Mean Plasma Glucose: 94 mg/dL

## 2024-08-07 LAB — MAGNESIUM: Magnesium: 1.6 mg/dL — ABNORMAL LOW (ref 1.7–2.4)

## 2024-08-07 LAB — PROCALCITONIN: Procalcitonin: <0.1 ng/mL

## 2024-08-07 MED ORDER — LABETALOL HCL 5 MG/ML IV SOLN
10.0000 mg | INTRAVENOUS | Status: DC | PRN
  Administered 2024-08-07 – 2024-08-08 (×6): 10 mg via INTRAVENOUS
  Filled 2024-08-07 (×4): qty 4

## 2024-08-07 MED ORDER — PHENYLEPHRINE HCL-NACL 20-0.9 MG/250ML-% IV SOLN
INTRAVENOUS | Status: AC
  Filled 2024-08-07: qty 250

## 2024-08-07 MED ORDER — AMLODIPINE BESYLATE 10 MG PO TABS
10.0000 mg | ORAL_TABLET | Freq: Every day | ORAL | Status: DC
  Administered 2024-08-07 (×2): 10 mg via ORAL
  Filled 2024-08-07: qty 1

## 2024-08-07 MED ORDER — MAGNESIUM SULFATE 4 GM/100ML IV SOLN
4.0000 g | Freq: Once | INTRAVENOUS | Status: AC
  Administered 2024-08-07 (×2): 4 g via INTRAVENOUS
  Filled 2024-08-07: qty 100

## 2024-08-07 MED ORDER — POTASSIUM CHLORIDE 2 MEQ/ML IV SOLN
INTRAVENOUS | Status: DC
  Filled 2024-08-07 (×2): qty 1000

## 2024-08-07 MED ORDER — PROPOFOL 1000 MG/100ML IV EMUL
INTRAVENOUS | Status: AC
  Filled 2024-08-07: qty 200

## 2024-08-07 MED ORDER — CLONIDINE HCL 0.2 MG/24HR TD PTWK
0.2000 mg | MEDICATED_PATCH | TRANSDERMAL | Status: DC
  Administered 2024-08-07 (×2): 0.2 mg via TRANSDERMAL
  Filled 2024-08-07 (×2): qty 1

## 2024-08-07 NOTE — TOC Progression Note (Signed)
 Transition of Care [facility]) - Progression Note    Patient Details  Name: Bradley Hansen MRN: [id number] Date of Birth: 1951-08-17  [facility] CM/SW Contact  Robynn Eileen Hoose, RN Phone Number: 08/07/2024, 3:41 PM  Clinical Narrative:   CIR following    Expected Discharge Plan: Skilled Nursing Facility Barriers to Discharge: Continued Medical Work up               Expected Discharge Plan and Services                                               Social Drivers of Health (SDOH) Interventions SDOH Screenings   Food Insecurity: No Food Insecurity (08/06/2024)  Housing: Low Risk  (08/06/2024)  Transportation Needs: No Transportation Needs (08/06/2024)  Utilities: Not At Risk (08/06/2024)  Social Connections: Unknown (08/06/2024)    Readmission Risk Interventions     No data to display

## 2024-08-07 NOTE — Progress Notes (Signed)
 LTM maint complete - no skin breakdown seen.  Serviced [facility] monitored, Event button test confirmed by [facility].

## 2024-08-07 NOTE — Progress Notes (Signed)
 PROGRESS NOTE                                                                                                                                                                                                             Patient Demographics:    Bradley Hansen, is a 73 y.o. male, DOB - 1951/08/20, FMW:[id number]  Outpatient Primary MD for the patient is Pcp, No    LOS - 2  Admit date - 08/04/2024    Chief Complaint  Patient presents with   Code Stroke       Brief Narrative (HPI from H&P)    73 y.o. male with no reported significant past medical history, per patient's wife, patient has no known significant past medical history, she reports that around noon today, the patient became acutely confused and aphasic, prompting the patient be brought to [facility] emergency department for further evaluation management [facility] did not note any tonic-clonic activity nor any tongue biting or loss of bowel/bladder function.   Subjective:   Seen in bed, mildly confused but in no distress clearly denies any headache, no chest pain or shortness of breath.   Assessment  & Plan :    Acute onset of metabolic encephalopathy and expressive aphasia.  No significant past medical history, no focal deficits, neurology on board, MRI head, CTA head and neck and EEG unremarkable,  LP was finally done successfully on 08/06/2024 by IR, CSF preliminary me does not appear to be consistent with meningitis, meningitis panel and respiratory viral panel negative, currently continues to be on antibiotics and acyclovir  have not requested neuro to consider discontinuing antibiotics and acyclovir .  Will defer it to them.  Long-term EEG also appears to be unremarkable.  Clinically definitely has slow but sustained improvement.  Talking to patient's wife this appears to be somewhat of a chronic problem which is gradually progressive question if he has undiagnosed  dementia, again will defer this to neurology.  Continue supportive care with IV fluids, PT-OT and speech follow-up.  Questionable pneumonia on chest x-ray at the time of admission, SLP to evaluate, gentle IV fluids, antibiotics for above will suffice.  Dyslipidemia.  On statin at home will resume once acute conditions stabilize.  Hypertension.  Placed on Catapres  patch and Norvasc  along with as needed hydralazine .  Monitor.    Hypokalemia and hypomagnesemia.  Replaced.    Nonspecific CTA findings in the vertebral arteries.  Secondary prevention per PCP, statin and aspirin  in the outpatient setting when able to.      Condition - Extremely Guarded  Family Communication  : Wife bedside on 08/06/2024, 08/07/2024 states that patient has been getting somewhat confused off and on for several months  Code Status : Full code  Consults  : Neurology, IR  PUD Prophylaxis :     Procedures  :     LP third attempt 08/06/2024 by IR.    MRI brain, EEG, CTA head and neck.  Nonacute.      Disposition Plan  :    Status is: Inpatient   DVT Prophylaxis  :    SCDs Start: 08/04/24 2243    Lab Results  Component Value Date   PLT 236 08/07/2024    Diet :  Diet Order             Diet NPO time specified Except for: Sips with Meds  Diet effective now                    Inpatient Medications  Scheduled Meds:  amLODipine   10 mg Oral Daily   aspirin  EC  81 mg Oral Daily   Or   aspirin   150 mg Rectal Daily   cloNIDine   0.2 mg Transdermal Weekly   scopolamine   1 patch Transdermal Q72H   sodium chloride  flush  3 mL Intravenous Once   Continuous Infusions:  acyclovir  750 mg (08/07/24 0117)   ampicillin  (OMNIPEN) IV 2 g (08/07/24 0926)   cefTRIAXone  (ROCEPHIN )  IV 2 g (08/07/24 0012)   lactated ringers 1,000 mL with potassium chloride  30 mEq infusion     magnesium  sulfate bolus IVPB     vancomycin  1,000 mg (08/07/24 0258)   PRN Meds:.acetaminophen  **OR** acetaminophen ,  haloperidol  lactate, hydrALAZINE , labetalol , ondansetron  (ZOFRAN ) IV  Antibiotics  :    Anti-infectives (From admission, onward)    Start     Dose/Rate Route Frequency Ordered Stop   08/06/24 0100  vancomycin  (VANCOCIN ) IVPB 1000 mg/200 mL premix        1,000 mg 200 mL/hr over 60 Minutes Intravenous Every 12 hours 08/05/24 1156     08/05/24 1915  ampicillin  (OMNIPEN) 2 g in sodium chloride  0.9 % 100 mL IVPB        2 g 300 mL/hr over 20 Minutes Intravenous Every 4 hours 08/05/24 1533     08/05/24 1200  ampicillin  (OMNIPEN) 2 g in sodium chloride  0.9 % 100 mL IVPB  Status:  Discontinued        2 g 300 mL/hr over 20 Minutes Intravenous Every 4 hours 08/05/24 1119 08/05/24 1533   08/05/24 1200  acyclovir  (ZOVIRAX ) 750 mg in dextrose  5 % 250 mL IVPB        10 mg/kg  75 kg 265 mL/hr over 60 Minutes Intravenous Every 8 hours 08/05/24 1154     08/05/24 1130  cefTRIAXone  (ROCEPHIN ) 2 g in sodium chloride  0.9 % 100 mL IVPB        2 g 200 mL/hr over 30 Minutes Intravenous Every 12 hours 08/05/24 1119     08/05/24 1130  vancomycin  (VANCOREADY) IVPB 1500 mg/300 mL        1,500 mg 150 mL/hr over 120 Minutes Intravenous  Once 08/05/24 1119 08/05/24 1754         Objective:   Vitals:   08/07/24 0500 08/07/24 0700 08/07/24 0744  08/07/24 0807  BP:   (!) 198/101 (!) 198/101  Pulse:    (!) 141  Resp:  15  (!) 25  Temp:    98.8 F (37.1 C)  TempSrc:    Oral  SpO2:    97%  Weight: 76 kg       Wt Readings from Last 3 Encounters:  08/07/24 76 kg     Intake/Output Summary (Last 24 hours) at 08/07/2024 1003 Last data filed at 08/07/2024 0818 Gross per 24 hour  Intake 3007.27 ml  Output 2250 ml  Net 757.27 ml     Physical Exam  Awake, confused but improved from before, no new F.N deficits, Normal affect [place].AT,PERRAL Supple Neck, No JVD,   Symmetrical Chest wall movement, Good air movement bilaterally, CTAB RRR,No Gallops,Rubs or new Murmurs,  +ve B.Sounds, Abd Soft, No tenderness,    No Cyanosis, Clubbing or edema        Data Review:    Recent Labs  Lab 08/04/24 2042 08/04/24 2045 08/05/24 0613 08/05/24 0913 08/05/24 0915 08/06/24 0715 08/07/24 0558  WBC 7.9  --  8.4  --   --  6.1 6.2  HGB 14.2   < > 13.9 14.6 13.6 13.5 13.0  HCT 43.8   < > 40.6 43.0 40.0 39.8 37.4*  PLT 241  --  222  --   --  214 236  MCV 93.2  --  89.4  --   --  88.6 88.2  MCH 30.2  --  30.6  --   --  30.1 30.7  MCHC 32.4  --  34.2  --   --  33.9 34.8  RDW 10.9*  --  11.1*  --   --  11.3* 11.0*  LYMPHSABS 0.5*  --  1.1  --   --  1.3 1.0  MONOABS 0.2  --  0.4  --   --  0.8 0.7  EOSABS 0.0  --  0.0  --   --  0.0 0.0  BASOSABS 0.0  --  0.0  --   --  0.0 0.0   < > = values in this interval not displayed.    Recent Labs  Lab 08/04/24 2042 08/04/24 2045 08/05/24 9386 08/05/24 9348 08/05/24 0746 08/05/24 0913 08/05/24 0915 08/06/24 0715 08/07/24 0558  NA 134* 135 133*  --   --  134* 134* 136 134*  K 3.8 3.7 3.8  --   --  3.9 3.5 3.2* 3.3*  CL 95* 95* 98  --   --   --   --  99 99  CO2 22  --  22  --   --   --   --  23 21*  ANIONGAP 17*  --  13  --   --   --   --  14 14  GLUCOSE 140* 135* 130*  --   --   --   --  110* 111*  BUN 6* 6* 7*  --   --   --   --  8 <5*  CREATININE 1.02 0.80 1.00  --   --   --   --  0.93 0.87  AST 35  --  39  --   --   --   --  48* 40  ALT 38  --  47*  --   --   --   --  63* 54*  ALKPHOS 78  --  72  --   --   --   --  67 60  BILITOT 1.1  --  1.2  --   --   --   --  1.3* 1.3*  ALBUMIN 4.2  --  4.0  --   --   --   --  3.6 3.6  CRP  --   --   --   --   --   --   --  1.4* 1.7*  PROCALCITON  --   --   --   --   --   --   --  <0.10 <0.10  INR 1.0  --   --   --   --   --   --   --   --   TSH  --   --   --  1.023  --   --   --   --   --   HGBA1C  --   --   --   --  4.9  --   --   --   --   AMMONIA  --   --   --  18  --   --   --  20  --   MG 1.7  --  1.8  --   --   --   --   --  1.6*  CALCIUM  9.8  --  9.9  --   --   --   --  9.5 9.1      Recent Labs   Lab 08/04/24 2042 08/05/24 0613 08/05/24 0651 08/05/24 0746 08/06/24 0715 08/07/24 0558  CRP  --   --   --   --  1.4* 1.7*  PROCALCITON  --   --   --   --  <0.10 <0.10  INR 1.0  --   --   --   --   --   TSH  --   --  1.023  --   --   --   HGBA1C  --   --   --  4.9  --   --   AMMONIA  --   --  18  --  20  --   MG 1.7 1.8  --   --   --  1.6*  CALCIUM  9.8 9.9  --   --  9.5 9.1    --------------------------------------------------------------------------------------------------------------- Lab Results  Component Value Date   CHOL 216 (H) 08/05/2024   HDL 52 08/05/2024   LDLCALC 155 (H) 08/05/2024   TRIG 45 08/05/2024   CHOLHDL 4.2 08/05/2024    Lab Results  Component Value Date   HGBA1C 4.9 08/05/2024   Recent Labs    08/05/24 0651  TSH 1.023   Recent Labs    08/05/24 0651  VITAMINB12 416   ------------------------------------------------------------------------------------------------------------------ Cardiac Enzymes No results for input(s): CKMB, TROPONINI, MYOGLOBIN in the last 168 hours.  Invalid input(s): CK  Micro Results Recent Results (from the past 240 hours)  Culture, blood (Routine X 2) w Reflex to ID Panel     Status: None (Preliminary result)   Collection Time: 08/05/24 11:10 AM   Specimen: BLOOD LEFT ARM  Result Value Ref Range Status   Specimen Description BLOOD LEFT ARM  Final   Special Requests   Final    BOTTLES DRAWN AEROBIC AND ANAEROBIC Blood Culture results may not be optimal due to an inadequate volume of blood received in culture bottles   Culture   Final    NO GROWTH 2 DAYS Performed at [facility], [address] [place], [place] [address]    Report Status PENDING  Incomplete  CSF culture w Gram Stain     Status: None (Preliminary result)   Collection Time: 08/05/24 12:28 PM   Specimen: PATH Cytology CSF; Cerebrospinal Fluid  Result Value Ref Range Status   Specimen Description CSF  Final   Special Requests  NONE  Final   Gram Stain   Final    NO WBC SEEN NO ORGANISMS SEEN CYTOSPIN SMEAR Performed at [facility], [address] [place], [place] [address]    Culture PENDING  Incomplete   Report Status PENDING  Incomplete  MRSA Next Gen by PCR, Nasal     Status: None   Collection Time: 08/05/24  9:11 PM   Specimen: Nasal Mucosa; Nasal Swab  Result Value Ref Range Status   MRSA by PCR Next Gen NOT DETECTED NOT DETECTED Final    Comment: (NOTE) The GeneXpert MRSA Assay (FDA approved for NASAL specimens only), is one component of a comprehensive MRSA colonization surveillance program. It is not intended to diagnose MRSA infection nor to guide or monitor treatment for MRSA infections. Test performance is not FDA approved in patients less than 62 years old. Performed at [facility], [address], [place], [place] [address]   Culture, blood (Routine X 2) w Reflex to ID Panel     Status: None (Preliminary result)   Collection Time: 08/06/24  7:15 AM   Specimen: BLOOD LEFT HAND  Result Value Ref Range Status   Specimen Description BLOOD LEFT HAND  Final   Special Requests   Final    BOTTLES DRAWN AEROBIC ONLY Blood Culture results may not be optimal due to an inadequate volume of blood received in culture bottles   Culture   Final    NO GROWTH < 24 HOURS Performed at [facility], [address], [place], [place] [address]    Report Status PENDING  Incomplete  Respiratory (~20 pathogens) panel by PCR     Status: None   Collection Time: 08/06/24  7:51 AM   Specimen: Nasopharyngeal Swab; Respiratory  Result Value Ref Range Status   Adenovirus NOT DETECTED NOT DETECTED Final   Coronavirus 229E NOT DETECTED NOT DETECTED Final    Comment: (NOTE) The Coronavirus on the Respiratory Panel, DOES NOT test for the novel  Coronavirus (2019 nCoV)    Coronavirus HKU1 NOT DETECTED NOT DETECTED Final   Coronavirus NL63 NOT DETECTED NOT DETECTED Final   Coronavirus OC43 NOT  DETECTED NOT DETECTED Final   Metapneumovirus NOT DETECTED NOT DETECTED Final   Rhinovirus / Enterovirus NOT DETECTED NOT DETECTED Final   Influenza A NOT DETECTED NOT DETECTED Final   Influenza B NOT DETECTED NOT DETECTED Final   Parainfluenza Virus 1 NOT DETECTED NOT DETECTED Final   Parainfluenza Virus 2 NOT DETECTED NOT DETECTED Final   Parainfluenza Virus 3 NOT DETECTED NOT DETECTED Final   Parainfluenza Virus 4 NOT DETECTED NOT DETECTED Final   Respiratory Syncytial Virus NOT DETECTED NOT DETECTED Final   Bordetella pertussis NOT DETECTED NOT DETECTED Final   Bordetella Parapertussis NOT DETECTED NOT DETECTED Final   Chlamydophila pneumoniae NOT DETECTED NOT DETECTED Final   Mycoplasma pneumoniae NOT DETECTED NOT DETECTED Final    Comment: Performed at [facility], [address], [place], [place] [address]    Radiology Report Overnight EEG with video Result Date: 08/07/2024 Shelton Arlin KIDD, MD     08/07/2024  9:45 AM Patient Name: HAMED DEBELLA MRN: [id number] Epilepsy Attending: Arlin MALVA Krebs Referring Physician/Provider: Voncile Isles, MD Duration: 08/06/2024 1901 to 08/07/2024 0944  Patient history:  73 y.o. M who presents with right-sided weakness and difficulty speaking. EEG to evaluate for seizure  Level of alertness: awake  AEDs during EEG study: None  Technical aspects: This EEG study was done with scalp electrodes positioned according to the 10-20 International system of electrode placement. Electrical activity was reviewed with band pass filter of 1-70Hz , sensitivity of 7 uV/mm, display speed of 42mm/sec with a 60Hz  notched filter applied as appropriate. EEG data were recorded continuously and digitally stored.  Video monitoring was available and reviewed as appropriate.  Description: The posterior dominant rhythm consists of 9 Hz activity of moderate voltage (25-35 uV) seen predominantly in posterior head regions, symmetric and reactive to eye opening and eye closing. EEG  showed intermittent generalized 3 to 6 Hz theta-delta slowing. Hyperventilation and photic stimulation were not performed.    ABNORMALITY - Intermittent slow, generalized  IMPRESSION: This study is suggestive of mild diffuse encephalopathy. No seizures or definite epileptiform discharges were seen throughout the recording.  Priyanka O Yadav    DG Fluoro Rm 1-60 Min - No Report Result Date: 08/06/2024 Fluoroscopy was utilized by the requesting physician.  No radiographic interpretation.   DG Chest Port 1 View Result Date: 08/06/2024 EXAM: 1 VIEW XRAY OF THE CHEST 08/06/2024 07:22:00 AM COMPARISON: 08/05/2024 CLINICAL HISTORY: [id number] SOB (shortness of breath). Reason for exam: pt order states SOB (shortness of breath); Best obtainable images due to pt condition. FINDINGS: LUNGS AND PLEURA: Minimal airspace opacities at the lung bases are stable, likely atelectasis. No pleural effusion. No pneumothorax. HEART AND MEDIASTINUM: Mild cardiomegaly is stable. Atherosclerotic changes are present at the aortic arch. BONES AND SOFT TISSUES: No acute osseous abnormality. IMPRESSION: 1. Stable mild cardiomegaly, low lung volumes, and minimal airspace opacities at the lung bases, likely atelectasis. 2. Atherosclerotic changes at the aortic arch. Electronically signed by: Lonni Necessary MD 08/06/2024 07:28 AM EDT RP Workstation: [id number]   DG FL GUIDED LUMBAR PUNCTURE Result Date: 08/05/2024 CLINICAL DATA:  73 year old male with altered mental status, being evaluated for possible meningitis or normal pressure hydrocephalus. IR was requested for LP, with 30 cc CSF collected. EXAM: LUMBAR PUNCTURE UNDER FLUOROSCOPY PROCEDURE: An appropriate skin entry site was determined fluoroscopically. Operator donned sterile gloves and mask. Skin site was marked, then prepped with Betadine, draped in usual sterile fashion, and infiltrated locally with 1% lidocaine . A 20 gauge spinal needle advanced into the thecal sac at L5-S1,  L4-L5, L3-L4 from a left interlaminar approach. Unfortunately, patient remained agitated throughout the procedure, constantly moving. No CSF returned at all 3 levels attempted. Therefore, procedure was aborted. The needle was then removed. FLUOROSCOPY: Radiation Exposure Index (as provided by the fluoroscopic device): 18.9 mGy Kerma IMPRESSION: Unsuccessful lumbar puncture under fluoroscopy. This exam was performed by Carlin Griffon, PA-C, and was supervised and interpreted by Dr. Juliene Balder, MD. Electronically Signed   By: Juliene Balder M.D.   On: 08/05/2024 20:20   DG CHEST PORT 1 VIEW Result Date: 08/05/2024 CLINICAL DATA:  Fever. EXAM: PORTABLE CHEST 1 VIEW COMPARISON:  08/05/2024 and CT chest 07/15/2023. FINDINGS: Trachea is midline. Heart is enlarged, stable. Mild left lobe interstitial prominence. No pleural fluid. Elevated right hemidiaphragm. IMPRESSION: Left lower lobe interstitial prominence may be due to a viral pneumonia or infectious bronchiolitis. Electronically Signed   By: Newell Eke M.D.  On: 08/05/2024 12:37     Signature  -   Lavada Stank M.D on 08/07/2024 at 10:03 AM   -  To page go to [email or web address]

## 2024-08-07 NOTE — Plan of Care (Signed)

## 2024-08-07 NOTE — Progress Notes (Addendum)
 NEUROLOGY CONSULT FOLLOW UP NOTE   Date of service: August 07, 2024 Patient Name: Bradley Hansen MRN:  [id number] DOB:  April 09, 1951  Interval Hx/subjective  Pt seen bedside this morning. He is awake, and moving his extremities spontaneously. He is able to state his name, state that he is in [place], [place] , but doesn't know why he is in the hospital.   Vitals   Vitals:   08/07/24 0500 08/07/24 0700 08/07/24 0744 08/07/24 0807  BP:   (!) 198/101 (!) 198/101  Pulse:    (!) 141  Resp:  15  (!) 25  Temp:    98.8 F (37.1 C)  TempSrc:    Oral  SpO2:    97%  Weight: 76 kg        There is no height or weight on file to calculate BMI.  Physical Exam   Constitutional: Appears well-developed and well-nourished, not in any acute distress. Moves extremities spontaneously, intermittently compliant with commands, and talks slowly. Psych: Affect appropriate to situation.  Eyes: No scleral injection.  HENT: No OP obstrucion.  Head: Normocephalic.  Cardiovascular: Tachycardic and regular rhythm.  Respiratory: Effort normal, non-labored breathing.   Neurologic Examination    Physical Exam Gen: A&Ox4, NAD HEENT: Atraumatic, normocephalic; oropharynx clear, tongue without atrophy or fasciculations. Resp: CTAB, normal work of breathing CV: RRR, extremities appear well-perfused. Abd: soft/NT/ND Extrem: Nml bulk; no cyanosis, clubbing, or edema.  Neuro: *MS: A&O x4. Does not follow multi-step commands.  *Speech: Expressive aphasia, able to state his name, but gets distracted very easily *CN:    I: Deferred   II,III: PERRLA, VFF by confrontation, optic discs not visualized 2/2 pupillary constriction   III,IV,VI: EOMI w/o nystagmus, no ptosis   V: Sensation intact from V1 to V3 to LT   VII: Eyelid closure was full.  Smile symmetric.   VIII: Hearing intact to voice   IX,X: Voice normal, palate elevates symmetrically    XI: SCM/trap 5/5 bilat   XII: Tongue protrudes midline, no  atrophy or fasciculations  *Motor:   Normal bulk.  No tremor, rigidity or bradykinesia. No pronator drift.   Strength: Dlt Bic Tri WE WrF FgS Gr HF KnF KnE PlF DoF    Left 4 4 4 4 4 5 5 5 5 5 5 5     Right 4 4 4 4 4 5 5 5 5 5 5 5    *Sensory: Unable to assess *Coordination: Would not follow commands for testing *Gait: deferred  NIHSS 1a Level of Conscious.: 0 1b LOC Questions: 0 1c LOC Commands: 2 2 Best Gaze: 0 3 Visual: 0 4 Facial Palsy: 0 5a Motor Arm - left: 2 5b Motor Arm - Right: 2 6a Motor Leg - Left: 2 6b Motor Leg - Right: 2 7 Limb Ataxia: 0 8 Sensory: 0 9 Best Language: 1 10 Dysarthria: 0 11 Extinct. and Inatten.: 1  TOTAL: 12    Medications  Current Facility-Administered Medications:    acetaminophen  (TYLENOL ) tablet 650 mg, 650 mg, Oral, Q6H PRN **OR** acetaminophen  (TYLENOL ) suppository 650 mg, 650 mg, Rectal, Q6H PRN, Howerter, Justin B, DO, 650 mg at 08/06/24 1418   acyclovir  (ZOVIRAX ) 750 mg in dextrose  5 % 250 mL IVPB, 10 mg/kg, Intravenous, Q8H, Gaines Carrier, RPH, Last Rate: 265 mL/hr at 08/07/24 0117, 750 mg at 08/07/24 0117   amLODipine  (NORVASC ) tablet 10 mg, 10 mg, Oral, Daily, Singh, Prashant K, MD, 10 mg at 08/07/24 0744   ampicillin  (OMNIPEN) 2 g in sodium  chloride 0.9 % 100 mL IVPB, 2 g, Intravenous, Q4H, Alekh, Kshitiz, MD, Last Rate: 200 mL/hr at 08/07/24 0926, 2 g at 08/07/24 0926   aspirin  EC tablet 81 mg, 81 mg, Oral, Daily **OR** aspirin  suppository 150 mg, 150 mg, Rectal, Daily, Michaela Aisha SQUIBB, MD, 150 mg at 08/06/24 9094   cefTRIAXone  (ROCEPHIN ) 2 g in sodium chloride  0.9 % 100 mL IVPB, 2 g, Intravenous, Q12H, Gaines Carrier, RPH, Last Rate: 200 mL/hr at 08/07/24 0012, 2 g at 08/07/24 0012   cloNIDine  (CATAPRES  - Dosed in mg/24 hr) patch 0.2 mg, 0.2 mg, Transdermal, Weekly, Dennise Lavada POUR, MD, 0.2 mg at 08/07/24 9192   haloperidol  lactate (HALDOL ) injection 2 mg, 2 mg, Intravenous, Q6H PRN, Dennise Lavada POUR, MD, 2 mg at  08/06/24 1644   hydrALAZINE  (APRESOLINE ) injection 10 mg, 10 mg, Intravenous, Q4H PRN, Howerter, Justin B, DO   labetalol  (NORMODYNE ) injection 10 mg, 10 mg, Intravenous, Q2H PRN, Singh, Prashant K, MD   lactated ringers  1,000 mL with potassium chloride  40 mEq infusion, , Intravenous, Continuous, Dennise Lavada POUR, MD, Last Rate: 75 mL/hr at 08/07/24 0635, New Bag at 08/07/24 9364   midazolam  (VERSED ) injection 2 mg, 2 mg, Intravenous, Once PRN, Remi Pippin, NP, 2 mg at 08/06/24 1127   ondansetron  (ZOFRAN ) injection 4 mg, 4 mg, Intravenous, Q6H PRN, Howerter, Justin B, DO   scopolamine  (TRANSDERM-SCOP) 1 MG/3DAYS 1.5 mg, 1 patch, Transdermal, Q72H, Howerter, Justin B, DO, 1.5 mg at 08/05/24 0137   sodium chloride  flush (NS) 0.9 % injection 3 mL, 3 mL, Intravenous, Once, Trifan, Donnice PARAS, MD   vancomycin  (VANCOCIN ) IVPB 1000 mg/200 mL premix, 1,000 mg, Intravenous, Q12H, Gaines Carrier, RPH, Last Rate: 200 mL/hr at 08/07/24 0258, 1,000 mg at 08/07/24 0258  Labs and Diagnostic Imaging   CBC:  Recent Labs  Lab 08/06/24 0715 08/07/24 0558  WBC 6.1 6.2  NEUTROABS 4.0 4.4  HGB 13.5 13.0  HCT 39.8 37.4*  MCV 88.6 88.2  PLT 214 236    Basic Metabolic Panel:  Lab Results  Component Value Date   NA 134 (L) 08/07/2024   K 3.3 (L) 08/07/2024   CO2 21 (L) 08/07/2024   GLUCOSE 111 (H) 08/07/2024   BUN <5 (L) 08/07/2024   CREATININE 0.87 08/07/2024   CALCIUM  9.1 08/07/2024   GFRNONAA >60 08/07/2024   Lipid Panel:  Lab Results  Component Value Date   LDLCALC 155 (H) 08/05/2024   HgbA1c: No results found for: HGBA1C Urine Drug Screen:     Component Value Date/Time   LABOPIA NONE DETECTED 08/05/2024 1109   COCAINSCRNUR NONE DETECTED 08/05/2024 1109   LABBENZ POSITIVE (A) 08/05/2024 1109   AMPHETMU NONE DETECTED 08/05/2024 1109   THCU NONE DETECTED 08/05/2024 1109   LABBARB NONE DETECTED 08/05/2024 1109    Alcohol Level     Component Value Date/Time   [facility] <15 08/04/2024  2042   INR  Lab Results  Component Value Date   INR 1.0 08/04/2024   APTT  Lab Results  Component Value Date   APTT 23 (L) 08/04/2024   AED levels: No results found for: PHENYTOIN, ZONISAMIDE, LAMOTRIGINE, LEVETIRACETA  CT Head without contrast (Personally reviewed): 1. Question asymmetric hyperdensity involving the distal left M1 segment/left MCA bifurcation, which could reflect thrombus. Correlation with dedicated CTA recommended. No acute intracranial hemorrhage or visible acute large vessel territory infarct. 2. Aspects is 10. 3. Moderate to advanced chronic microvascular ischemic disease.   CT angio Head and Neck with contrast(Personally  reviewed): 1. Negative CTA for large vessel occlusion or other emergent finding. 2. Negative CT perfusion for acute ischemia or other perfusion abnormality. 3. Atheromatous plaque at the origins of both vertebral arteries with severe ostial stenoses. 4. Mild atheromatous change about the carotid bifurcations without hemodynamically significant stenosis. 5.  Aortic Atherosclerosis (ICD10-I70.0).   MRI Brain(Personally reviewed): 1. Limited study due to patient's inability to tolerate the exam. 2. No acute or subacute infarction on axial diffusion-weighted images.  Assessment  Bradley Hansen is a 73 y.o. male with past medical history of single episode of postop atrial flutter s/p mitral valve repair, HTN, HLD, Hep C, nonischemic cardiomyopathy who presented on 8/8 with right sided weakness and difficulty speaking. MRI was negative for acute stroke.   #Acute Encephalopathy - Unclear Etiology - His mental status today seems consistent with what has been documented yesterday, with maybe some mild improvement as he is able to answer simple questions like his name, that he's in [place], and that he is in [place] . Was not able to state that he is in the hospital. Does have a lot of difficulty with attention. He is intermittently  compliant with commands, and doesn't understand multistep commands.  - LTM EEG report for today: Intermittent slow, generalized. This study is suggestive of mild diffuse encephalopathy. No seizures or definite epileptiform discharges were seen throughout the recording. - Etiology for AMS remains unclear. Initially brought in for concerns for stroke, but MR negative for acute infarction. With fever yesterday decision was made to start on broad spectrum antibiotics for meningitis coverage, but meningitis panel doesn't appear consistent with meningitis. Viral meningitis panel negative, and culture so far negative of spinal fluid. Other infectious sources have also been unrevealing. Blood cultures with no growth so far.  UA has not been collected as of yet. With negative meningitis panel but ongoing encephalopathy, if pt does not improve, may need to consider repeat imaging for potential dementia findings as last MRI was very limited.   Recommendations  - Meningitis-dose ABX discontinued   - Discontinue long term EEG  - Continue PT/OT, worked with SLP today and recommended to keep NPO  ______________________________________________________________________   Bonney Roetta Chars, MD Internal Medicine Resident, PGY-3   Electronically signed: Dr. Kaelani Kendrick

## 2024-08-07 NOTE — Progress Notes (Signed)
 vLTM maintenance  All impedances below 10k  No skin breakdown noted on FP1  FP2  F4 F3

## 2024-08-07 NOTE — Procedures (Signed)
 Patient Name: Bradley Hansen  MRN: [id number]  Epilepsy Attending: Arlin MALVA Krebs  Referring Physician/Provider: Voncile Isles, MD  Duration: 08/06/2024 1901 to 08/07/2024 0944   Patient history:  73 y.o. M who presents with right-sided weakness and difficulty speaking. EEG to evaluate for seizure   Level of alertness: awake   AEDs during EEG study: None   Technical aspects: This EEG study was done with scalp electrodes positioned according to the 10-20 International system of electrode placement. Electrical activity was reviewed with band pass filter of 1-70Hz , sensitivity of 7 uV/mm, display speed of 57mm/sec with a 60Hz  notched filter applied as appropriate. EEG data were recorded continuously and digitally stored.  Video monitoring was available and reviewed as appropriate.   Description: The posterior dominant rhythm consists of 9 Hz activity of moderate voltage (25-35 uV) seen predominantly in posterior head regions, symmetric and reactive to eye opening and eye closing. EEG showed intermittent generalized 3 to 6 Hz theta-delta slowing. Hyperventilation and photic stimulation were not performed.      ABNORMALITY - Intermittent slow, generalized   IMPRESSION: This study is suggestive of mild diffuse encephalopathy. No seizures or definite epileptiform discharges were seen throughout the recording.   Heloise Gordan O Shelbie Franken

## 2024-08-07 NOTE — Progress Notes (Signed)
  Echocardiogram 2D Echocardiogram has been performed.  Bradley Hansen 08/07/2024, 8:18 AM

## 2024-08-07 NOTE — Evaluation (Signed)
 Clinical/Bedside Swallow Evaluation Patient Details  Name: Bradley Hansen MRN: [id number] Date of Birth: [birth year]  Today's Date: 08/07/2024 Time: SLP Start Time (ACUTE ONLY): 0856 SLP Stop Time (ACUTE ONLY): 0921 SLP Time Calculation (min) (ACUTE ONLY): 25 min  Past Medical History: History reviewed. No pertinent past medical history. Past Surgical History: History reviewed. No pertinent surgical history. HPI:  Pt is a 73 yo presenting with acute onset aphasia and R weakness. MRI limited but did not reveal an acute infarct. LP unremarkable. Initial CXR with possible PNA but repeat CXR 8/10 shows likely atelectasis. No pertinent PMHx    Assessment / Plan / Recommendation  Clinical Impression  Pt's mentation is likely the primary barrier to PO intake at this time.  He is awake but confused, not following commands. Video interpreter had trouble understanding him but upon clarification, this seemed to be an audio problem. He is resistant to attempts at repositioning him and shows reduced awareness of boluses presented (biting straws, puckering his lips at a spoon). When he does swallow, he consumes consecutive straw sips of thin liquids without overt coughing, but there is throat clearing noted regardless of consistency. Hopeful that he will be able to progress as mentation begins to clear given no acute findings on MRI, but for now, would start with ice chips or small sips of water after oral care, under staff supervision, as mentation allows. Could also try meds crushed in puree.   SLP Visit Diagnosis: Dysphagia, unspecified (R13.10)    Aspiration Risk       Diet Recommendation NPO except meds;Ice chips PRN after oral care    Medication Administration: Crushed with puree    Other  Recommendations Oral Care Recommendations: Oral care QID     Assistance Recommended at Discharge    Functional Status Assessment Patient has had a recent decline in their functional status and demonstrates the  ability to make significant improvements in function in a reasonable and predictable amount of time.  Frequency and Duration min 2x/week  2 weeks       Prognosis Prognosis for improved oropharyngeal function: Good Barriers to Reach Goals: Cognitive deficits      Swallow Study   General HPI: Pt is a 73 yo presenting with acute onset aphasia and R weakness. MRI limited but did not reveal an acute infarct. LP unremarkable. Initial CXR with possible PNA but repeat CXR 8/10 shows likely atelectasis. No pertinent PMHx Type of Study: Bedside Swallow Evaluation Previous Swallow Assessment: none in chart Diet Prior to this Study: NPO Temperature Spikes Noted: No Respiratory Status: Room air History of Recent Intubation: No Behavior/Cognition: Alert;Cooperative;Requires cueing Oral Cavity Assessment: Within Functional Limits Oral Care Completed by SLP: No Oral Cavity - Dentition: Adequate natural dentition Self-Feeding Abilities: Total assist Patient Positioning: Upright in bed Baseline Vocal Quality: Normal Volitional Cough: Cognitively unable to elicit Volitional Swallow: Unable to elicit    Oral/Motor/Sensory Function Overall Oral Motor/Sensory Function: Other (comment) (not following commands)   Ice Chips Ice chips: Impaired Presentation: Spoon Oral Phase Functional Implications: Oral holding   Thin Liquid Thin Liquid: Impaired Presentation: Straw Oral Phase Impairments: Poor awareness of bolus Pharyngeal  Phase Impairments: Throat Clearing - Delayed    Nectar Thick Nectar Thick Liquid: Not tested   Honey Thick Honey Thick Liquid: Not tested   Puree Puree: Impaired Presentation: Spoon Oral Phase Impairments: Poor awareness of bolus Oral Phase Functional Implications: Prolonged oral transit Pharyngeal Phase Impairments: Throat Clearing - Delayed   Solid  Solid: Not tested      Leita SAILOR., M.A. CCC-SLP Acute Rehabilitation Services Office: [phone]  Secure chat  preferred  08/07/2024,10:05 AM

## 2024-08-08 ENCOUNTER — Inpatient Hospital Stay (HOSPITAL_COMMUNITY)

## 2024-08-08 DIAGNOSIS — R4701 Aphasia: Secondary | ICD-10-CM | POA: Diagnosis not present

## 2024-08-08 DIAGNOSIS — G9340 Encephalopathy, unspecified: Secondary | ICD-10-CM | POA: Diagnosis not present

## 2024-08-08 LAB — CBC WITH DIFFERENTIAL/PLATELET
Abs Immature Granulocytes: 0.02 K/uL (ref 0.00–0.07)
Basophils Absolute: 0.1 K/uL (ref 0.0–0.1)
Basophils Relative: 1 %
Eosinophils Absolute: 0.1 K/uL (ref 0.0–0.5)
Eosinophils Relative: 1 %
HCT: 39.5 % (ref 39.0–52.0)
Hemoglobin: 13.3 g/dL (ref 13.0–17.0)
Immature Granulocytes: 0 %
Lymphocytes Relative: 22 %
Lymphs Abs: 1.2 K/uL (ref 0.7–4.0)
MCH: 30 pg (ref 26.0–34.0)
MCHC: 33.7 g/dL (ref 30.0–36.0)
MCV: 89.2 fL (ref 80.0–100.0)
Monocytes Absolute: 0.5 K/uL (ref 0.1–1.0)
Monocytes Relative: 9 %
Neutro Abs: 3.7 K/uL (ref 1.7–7.7)
Neutrophils Relative %: 67 %
Platelets: 246 K/uL (ref 150–400)
RBC: 4.43 MIL/uL (ref 4.22–5.81)
RDW: 11.1 % — ABNORMAL LOW (ref 11.5–15.5)
WBC: 5.6 K/uL (ref 4.0–10.5)
nRBC: 0 % (ref 0.0–0.2)

## 2024-08-08 LAB — COMPREHENSIVE METABOLIC PANEL WITH GFR
ALT: 48 U/L — ABNORMAL HIGH (ref 0–44)
AST: 36 U/L (ref 15–41)
Albumin: 3.4 g/dL — ABNORMAL LOW (ref 3.5–5.0)
Alkaline Phosphatase: 58 U/L (ref 38–126)
Anion gap: 11 (ref 5–15)
BUN: 6 mg/dL — ABNORMAL LOW (ref 8–23)
CO2: 23 mmol/L (ref 22–32)
Calcium: 9.2 mg/dL (ref 8.9–10.3)
Chloride: 100 mmol/L (ref 98–111)
Creatinine, Ser: 0.85 mg/dL (ref 0.61–1.24)
GFR, Estimated: >60 mL/min (ref >60)
Glucose, Bld: 100 mg/dL — ABNORMAL HIGH (ref 70–99)
Potassium: 3.5 mmol/L (ref 3.5–5.1)
Sodium: 134 mmol/L — ABNORMAL LOW (ref 135–145)
Total Bilirubin: 1.6 mg/dL — ABNORMAL HIGH (ref 0.0–1.2)
Total Protein: 7.5 g/dL (ref 6.5–8.1)

## 2024-08-08 LAB — C-REACTIVE PROTEIN: CRP: 0.9 mg/dL (ref <1.0)

## 2024-08-08 LAB — HIV ANTIBODY (ROUTINE TESTING W REFLEX): HIV Screen 4th Generation wRfx: NONREACTIVE

## 2024-08-08 LAB — PROCALCITONIN: Procalcitonin: <0.1 ng/mL

## 2024-08-08 LAB — MAGNESIUM: Magnesium: 2.3 mg/dL (ref 1.7–2.4)

## 2024-08-08 MED ORDER — CARVEDILOL 3.125 MG PO TABS
3.1250 mg | ORAL_TABLET | Freq: Two times a day (BID) | ORAL | Status: DC
  Administered 2024-08-08 – 2024-08-09 (×6): 3.125 mg via ORAL
  Filled 2024-08-08 (×4): qty 1

## 2024-08-08 MED ORDER — ROSUVASTATIN CALCIUM 5 MG PO TABS
10.0000 mg | ORAL_TABLET | Freq: Every day | ORAL | Status: DC
  Administered 2024-08-08 – 2024-08-09 (×4): 10 mg via ORAL
  Filled 2024-08-08 (×2): qty 2

## 2024-08-08 MED ORDER — AMLODIPINE BESYLATE 10 MG PO TABS
10.0000 mg | ORAL_TABLET | Freq: Every day | ORAL | Status: DC
  Administered 2024-08-08 – 2024-08-09 (×4): 10 mg via ORAL
  Filled 2024-08-08 (×2): qty 1

## 2024-08-08 MED ORDER — POTASSIUM CL IN DEXTROSE 5% 20 MEQ/L IV SOLN
20.0000 meq | INTRAVENOUS | Status: DC
  Administered 2024-08-08 (×2): 20 meq via INTRAVENOUS
  Filled 2024-08-08: qty 1000

## 2024-08-08 NOTE — Plan of Care (Signed)

## 2024-08-08 NOTE — Progress Notes (Addendum)
 NEUROLOGY CONSULT FOLLOW UP NOTE   Date of service: August 08, 2024 Patient Name: Bradley Hansen MRN:  [id number] DOB:  04-19-1951  Interval Hx/subjective  Pt seen bedside this morning, with wife bedside as well. His mental status appears similar to yesterday; he is able to state that he is in [place] and in [place] . Continues to have significant poor concentration and attention.    Vitals   Vitals:   08/08/24 0600 08/08/24 0611 08/08/24 0612 08/08/24 0800  BP: 104/73 129/68 129/68   Pulse: 78 79 77   Resp: 17 15    Temp:    98.5 F (36.9 C)  TempSrc:    Oral  SpO2: 100% 100%    Weight:         There is no height or weight on file to calculate BMI.  Physical Exam   Constitutional: Appears well-developed and well-nourished, not in any acute distress. Moves extremities spontaneously, intermittently compliant with commands, and talks slowly. Eyes: No scleral injection.  HENT: No OP obstrucion.  Head: Normocephalic.   Neurologic Examination    Neuro: *Ment: A&O x2. Does not follow multi-step commands.  *Speech: Expressive aphasia, able to state his name, poor concentration, can't keep train of thoughts *CN:    I: Deferred   III,IV,VI: EOMI w/o nystagmus, no ptosis   VIII: Hearing intact to voice   IX,X: Voice normal, palate elevates symmetrically    XI: SCM/trap 5/5 bilat     XII: Tongue protrudes midline, no atrophy or fasciculations  *Motor:  Normal bulk.  No tremor, rigidity or bradykinesia. No pronator drift.  Strength: Dlt Bic Tri WE WrF FgS Gr HF KnF KnE PlF DoF    Left 4 4 4 4 4 5 5 5 4 4 5 5     Right 4 4 4 4 4 5 5 5 5 4 5 5    *Sensory: Unable to assess *Coordination: Would not follow commands for testing *Gait: Deferred   Medications  Current Facility-Administered Medications:    acetaminophen  (TYLENOL ) tablet 650 mg, 650 mg, Oral, Q6H PRN **OR** acetaminophen  (TYLENOL ) suppository 650 mg, 650 mg, Rectal, Q6H PRN, Howerter, Justin B, DO, 650 mg at  08/06/24 1418   amLODipine  (NORVASC ) tablet 10 mg, 10 mg, Oral, Daily, Singh, Prashant K, MD, 10 mg at 08/08/24 0612   aspirin  EC tablet 81 mg, 81 mg, Oral, Daily, 81 mg at 08/07/24 1025 **OR** aspirin  suppository 150 mg, 150 mg, Rectal, Daily, Michaela Aisha SQUIBB, MD, 150 mg at 08/06/24 9094   carvedilol  (COREG ) tablet 3.125 mg, 3.125 mg, Oral, BID WC, Singh, Prashant K, MD, 3.125 mg at 08/08/24 9387   dextrose  5 % with KCl 20 mEq / L  infusion, 20 mEq, Intravenous, Continuous, Singh, Prashant K, MD   haloperidol  lactate (HALDOL ) injection 2 mg, 2 mg, Intravenous, Q6H PRN, Singh, Prashant K, MD, 2 mg at 08/06/24 1644   hydrALAZINE  (APRESOLINE ) injection 10 mg, 10 mg, Intravenous, Q4H PRN, Howerter, Justin B, DO   labetalol  (NORMODYNE ) injection 10 mg, 10 mg, Intravenous, Q2H PRN, Singh, Prashant K, MD, 10 mg at 08/08/24 0008   ondansetron  (ZOFRAN ) injection 4 mg, 4 mg, Intravenous, Q6H PRN, Howerter, Justin B, DO   sodium chloride  flush (NS) 0.9 % injection 3 mL, 3 mL, Intravenous, Once, Cottie Donnice PARAS, MD  Labs and Diagnostic Imaging   CBC:  Recent Labs  Lab 08/07/24 0558 08/08/24 0515  WBC 6.2 5.6  NEUTROABS 4.4 3.7  HGB 13.0 13.3  HCT 37.4* 39.5  MCV 88.2 89.2  PLT 236 246    Basic Metabolic Panel:  Lab Results  Component Value Date   NA 134 (L) 08/08/2024   K 3.5 08/08/2024   CO2 23 08/08/2024   GLUCOSE 100 (H) 08/08/2024   BUN 6 (L) 08/08/2024   CREATININE 0.85 08/08/2024   CALCIUM  9.2 08/08/2024   GFRNONAA >60 08/08/2024   Lipid Panel:  Lab Results  Component Value Date   LDLCALC 155 (H) 08/05/2024   HgbA1c:  Lab Results  Component Value Date   HGBA1C 4.9 08/05/2024   Urine Drug Screen:     Component Value Date/Time   LABOPIA NONE DETECTED 08/05/2024 1109   COCAINSCRNUR NONE DETECTED 08/05/2024 1109   LABBENZ POSITIVE (A) 08/05/2024 1109   AMPHETMU NONE DETECTED 08/05/2024 1109   THCU NONE DETECTED 08/05/2024 1109   LABBARB NONE DETECTED 08/05/2024  1109    Alcohol Level     Component Value Date/Time   [facility] <15 08/04/2024 2042   INR  Lab Results  Component Value Date   INR 1.0 08/04/2024   APTT  Lab Results  Component Value Date   APTT 23 (L) 08/04/2024    CT Head without contrast (Personally reviewed): 1. Question asymmetric hyperdensity involving the distal left M1 segment/left MCA bifurcation, which could reflect thrombus. Correlation with dedicated CTA recommended. No acute intracranial hemorrhage or visible acute large vessel territory infarct. 2. Aspects is 10. 3. Moderate to advanced chronic microvascular ischemic disease.   CT angio Head and Neck with contrast (Personally reviewed): 1. Negative CTA for large vessel occlusion or other emergent finding. 2. Negative CT perfusion for acute ischemia or other perfusion abnormality. 3. Atheromatous plaque at the origins of both vertebral arteries with severe ostial stenoses. 4. Mild atheromatous change about the carotid bifurcations without hemodynamically significant stenosis. 5. Aortic atherosclerosis.   MRI Brain (Personally reviewed): 1. Limited study due to patient's inability to tolerate the exam. 2. No acute or subacute infarction on axial diffusion-weighted images.  Assessment  Bradley Hansen is a 73 y.o. male with past medical history of a single episode of postop atrial flutter s/p mitral valve repair, HTN, HLD, Hep C and nonischemic cardiomyopathy who presented on 8/8 with right sided weakness and difficulty speaking. MRI was negative for acute stroke.   #Acute Encephalopathy - Unclear Etiology - His mental status today seems consistent with what has been documented yesterday, with maybe some mild improvement as he is able to answer simple questions like his name, that he's in [place], and that he is in [place] . Was not able to state that he is in the hospital. Does have significant difficulty with attention. He is intermittently compliant with commands,  and doesn't understand multistep commands.  - LTM EEG from Monday: Intermittent slow, generalized. This study is suggestive of mild diffuse encephalopathy. No seizures or definite epileptiform discharges were seen throughout the recording. - Etiology for AMS remains unclear. Initially brought in for concerns for stroke, but MR negative for acute infarction. With fever yesterday, decision was made to start on broad spectrum antibiotics for meningitis coverage, but LP basic labs and CSF meningitis panel did not appear consistent with meningitis. Viral meningitis panel negative, and CSF culture so far negative. Other infectious work up has also been unrevealing. Blood cultures with no growth so far. UA has not been collected as of yet. With negative meningitis panel but ongoing encephalopathy, if pt does not improve, may need to consider repeat imaging for potential dementia findings as  last MRI was very limited.  - Suspect his condition will improve with time, as he already has been improving slowly. Would recommend delirium precautions with open blinds, removal of mittens. Will need to continue working on his strength and swallowing ability.   Recommendations  - LTM EEG was discontinued overnight.  - Continue PT/OT, will work with SLP today for re-evaluation of NPO status - Neurohospitalist service will follow PRN. Please call if there are additional questions.  ______________________________________________________________________   Signed, Saad Nooruddin, MD Internal Medicine Resident, PGY-3   Electronically signed: Dr. Quierra Silverio

## 2024-08-08 NOTE — Progress Notes (Signed)
 Inpatient Rehab Admissions Coordinator:   Consult received and chart reviewed.  Note workup for acute/reversible cause has been negative.  I do not think we can get approval from [facility] Medicare for CIR with this presentation.  I will sign off.   Reche Lowers, PT, DPT Admissions Coordinator [phone] 08/08/24  12:43 PM

## 2024-08-08 NOTE — Progress Notes (Signed)
 LTM VIDEO EEG discontinued - no skin breakdown at [facility].

## 2024-08-08 NOTE — Progress Notes (Signed)
 Physical Therapy Treatment Patient Details Name: Bradley Hansen MRN: [id number] DOB: 22-Jul-1951 Today's Date: 08/08/2024   History of Present Illness 73 y.o. male presents to [facility] 08/04/24 with expressive aphasia and R sided weakness. MRI brain pending. EEG suggestive of moderate diffuse encephalopathy. No pertinent PMHx    PT Comments  Pt tolerated treatment well today. Pt appears to be much more alert than previous session however still limited by cognitive deficits. Pt today able to stand twice with +2 Min A and even take a few steps with +2 Max A HHA. No change in DC/DME recs at this time. PT will continue to follow.     If plan is discharge home, recommend the following: A lot of help with walking and/or transfers;A lot of help with bathing/dressing/bathroom;Assistance with cooking/housework;Direct supervision/assist for medications management;Direct supervision/assist for financial management;Assist for transportation;Help with stairs or ramp for entrance   Can travel by private vehicle        Equipment Recommendations  Other (comment)    Recommendations for Other Services       Precautions / Restrictions Precautions Precautions: Fall Recall of Precautions/Restrictions: Impaired Restrictions Weight Bearing Restrictions Per Provider Order: No     Mobility  Bed Mobility Overal bed mobility: Needs Assistance Bed Mobility: Supine to Sit, Sit to Supine     Supine to sit: Mod assist Sit to supine: +2 for physical assistance, Total assist   General bed mobility comments: Mod A to EOB. Frequent cues for sequencing. +2 total to return to bed due to poor command following.    Transfers Overall transfer level: Needs assistance Equipment used: 2 person hand held assist, None Transfers: Sit to/from Stand Sit to Stand: +2 physical assistance, Min assist           General transfer comment: Pt able to stand twice once with HHA and other impulsively with no AD however requires +2  Min A for safety.    Ambulation/Gait Ambulation/Gait assistance: +2 physical assistance, Max assist Gait Distance (Feet): 6 Feet Assistive device: 2 person hand held assist Gait Pattern/deviations: Leaning posteriorly, Decreased stride length, Step-through pattern Gait velocity: decreased     General Gait Details: Pt noted with very heavy posterior lean upon standing. Pt began impulsively taking steps despite cues to march in place. +2 Max A for balance and safety.   Stairs             Wheelchair Mobility     Tilt Bed    Modified Rankin (Stroke Patients Only)       Balance Overall balance assessment: Needs assistance Sitting-balance support: No upper extremity supported, Feet unsupported Sitting balance-Leahy Scale: Poor     Standing balance support: Bilateral upper extremity supported Standing balance-Leahy Scale: Zero Standing balance comment: Heavy posterior lean                            Communication Communication Communication: Impaired Factors Affecting Communication: Reduced clarity of speech  Cognition Arousal: Alert Behavior During Therapy: Flat affect, Impulsive   PT - Cognitive impairments: Problem solving, Sequencing, Initiation, Attention, Awareness, Safety/Judgement                       PT - Cognition Comments: poor safety awareness and slight impulsivity noticed. Followed commands inconsistently. Pt was A&Ox2. Following commands: Impaired      Cueing Cueing Techniques: Verbal cues, Gestural cues, Tactile cues  Exercises      General  Comments General comments (skin integrity, edema, etc.): VSS      Pertinent Vitals/Pain Pain Assessment Pain Assessment: No/denies pain    Home Living                          Prior Function            PT Goals (current goals can now be found in the care plan section) Progress towards PT goals: Progressing toward goals    Frequency    Min 2X/week      PT  Plan      Co-evaluation              AM-PAC PT 6 Clicks Mobility   Outcome Measure  Help needed turning from your back to your side while in a flat bed without using bedrails?: A Lot Help needed moving from lying on your back to sitting on the side of a flat bed without using bedrails?: A Lot Help needed moving to and from a bed to a chair (including a wheelchair)?: A Lot Help needed standing up from a chair using your arms (e.g., wheelchair or bedside chair)?: A Lot Help needed to walk in hospital room?: Total Help needed climbing 3-5 steps with a railing? : Total 6 Click Score: 10    End of Session Equipment Utilized During Treatment: Gait belt Activity Tolerance: Patient tolerated treatment well Patient left: in bed;with call bell/phone within reach;with family/visitor present;with bed alarm set Nurse Communication: Mobility status PT Visit Diagnosis: Other symptoms and signs involving the nervous system (R29.898);Other abnormalities of gait and mobility (R26.89)     Time: 8865-8851 PT Time Calculation (min) (ACUTE ONLY): 14 min  Charges:    $Therapeutic Activity: 8-22 mins PT General Charges $$ ACUTE PT VISIT: 1 Visit                     Sueellen NOVAK, PT, DPT Acute Rehab Services [phone]    [name] 08/08/2024, 3:33 PM

## 2024-08-08 NOTE — Plan of Care (Signed)

## 2024-08-08 NOTE — Progress Notes (Signed)
 PROGRESS NOTE                                                                                                                                                                                                             Patient Demographics:    Bradley Hansen, is a 73 y.o. male, DOB - 10/04/1951, FMW:[id number]  Outpatient Primary MD for the patient is Pcp, No    LOS - 3  Admit date - 08/04/2024    Chief Complaint  Patient presents with   Code Stroke       Brief Narrative (HPI from H&P)    73 y.o. male with no reported significant past medical history, per patient's wife, patient has no known significant past medical history, she reports that around noon today, the patient became acutely confused and aphasic, prompting the patient be brought to [facility] emergency department for further evaluation management [facility] did not note any tonic-clonic activity nor any tongue biting or loss of bowel/bladder function.   Subjective:   Patient remains in bed, in no distress, confused but definitely improving, denies headache chest pain or shortness of breath.   Assessment  & Plan :    Acute onset of metabolic encephalopathy and expressive aphasia.  No significant past medical history, no focal deficits, neurology on board, MRI head, CTA head and neck and EEG unremarkable,  LP was finally done successfully on 08/06/2024 by IR, CSF preliminary me does not appear to be consistent with meningitis, meningitis panel and respiratory viral panel negative, currently continues to be on antibiotics and acyclovir  have not requested neuro to consider discontinuing antibiotics and acyclovir .  Will defer it to them.  Long-term EEG also appears to be unremarkable.  Clinically definitely has slow but sustained improvement.  Talking to patient's wife this appears to be somewhat of a chronic problem which is gradually progressive question if he has  undiagnosed dementia, again will defer this to neurology.  Continue supportive care with IV fluids, PT-OT and speech follow-up.  Hopefully we will be able to advance his diet on 08/08/2024, able to follow basic commands and take oral medications for now.  Questionable pneumonia on chest x-ray at the time of admission, SLP to evaluate, gentle IV fluids, antibiotics for above will suffice.  Dyslipidemia.  On statin at home will resume once acute conditions stabilize.  Hypertension.  Took some oral pills, blood pressure improved, discontinue Catapres  patch and monitor.  Hypokalemia and hypomagnesemia.  Replaced.    Nonspecific CTA findings in the vertebral arteries.  Secondary prevention per PCP, statin and aspirin  in the outpatient setting when able to.      Condition - Extremely Guarded  Family Communication  : Wife bedside on 08/06/2024, 08/07/2024 states that patient has been getting somewhat confused off and on for several months  Code Status : Full code  Consults  : Neurology, IR  PUD Prophylaxis :     Procedures  :     LP third attempt 08/06/2024 by IR.    MRI brain, EEG, CTA head and neck.  Nonacute.      Disposition Plan  :    Status is: Inpatient   DVT Prophylaxis  :    SCDs Start: 08/04/24 2243    Lab Results  Component Value Date   PLT 246 08/08/2024    Diet :  Diet Order             Diet NPO time specified Except for: Sips with Meds  Diet effective now                    Inpatient Medications  Scheduled Meds:  amLODipine   10 mg Oral Daily   aspirin  EC  81 mg Oral Daily   Or   aspirin   150 mg Rectal Daily   carvedilol   3.125 mg Oral BID WC   sodium chloride  flush  3 mL Intravenous Once   Continuous Infusions:  dextrose  5 % with KCl 20 mEq / L     PRN Meds:.acetaminophen  **OR** acetaminophen , haloperidol  lactate, hydrALAZINE , labetalol , ondansetron  (ZOFRAN ) IV  Antibiotics  :    Anti-infectives (From admission, onward)    Start      Dose/Rate Route Frequency Ordered Stop   08/06/24 0100  vancomycin  (VANCOCIN ) IVPB 1000 mg/200 mL premix  Status:  Discontinued        1,000 mg 200 mL/hr over 60 Minutes Intravenous Every 12 hours 08/05/24 1156 08/07/24 1051   08/05/24 1915  ampicillin  (OMNIPEN) 2 g in sodium chloride  0.9 % 100 mL IVPB  Status:  Discontinued        2 g 300 mL/hr over 20 Minutes Intravenous Every 4 hours 08/05/24 1533 08/07/24 1050   08/05/24 1200  ampicillin  (OMNIPEN) 2 g in sodium chloride  0.9 % 100 mL IVPB  Status:  Discontinued        2 g 300 mL/hr over 20 Minutes Intravenous Every 4 hours 08/05/24 1119 08/05/24 1533   08/05/24 1200  acyclovir  (ZOVIRAX ) 750 mg in dextrose  5 % 250 mL IVPB  Status:  Discontinued        10 mg/kg  75 kg 265 mL/hr over 60 Minutes Intravenous Every 8 hours 08/05/24 1154 08/07/24 1050   08/05/24 1130  cefTRIAXone  (ROCEPHIN ) 2 g in sodium chloride  0.9 % 100 mL IVPB  Status:  Discontinued        2 g 200 mL/hr over 30 Minutes Intravenous Every 12 hours 08/05/24 1119 08/07/24 1050   08/05/24 1130  vancomycin  (VANCOREADY) IVPB 1500 mg/300 mL        1,500 mg 150 mL/hr over 120 Minutes Intravenous  Once 08/05/24 1119 08/05/24 1754         Objective:   Vitals:   08/08/24 0600 08/08/24 0611 08/08/24 0612 08/08/24 0800  BP: 104/73 129/68 129/68   Pulse: 78 79 77  Resp: 17 15    Temp:    98.5 F (36.9 C)  TempSrc:    Oral  SpO2: 100% 100%    Weight:        Wt Readings from Last 3 Encounters:  08/07/24 76 kg     Intake/Output Summary (Last 24 hours) at 08/08/2024 0827 Last data filed at 08/08/2024 0600 Gross per 24 hour  Intake 912.55 ml  Output 1400 ml  Net -487.45 ml     Physical Exam  Awake, confused but improved from before, no new F.N deficits, Normal affect [place].AT,PERRAL Supple Neck, No JVD,   Symmetrical Chest wall movement, Good air movement bilaterally, CTAB RRR,No Gallops,Rubs or new Murmurs,  +ve B.Sounds, Abd Soft, No tenderness,   No Cyanosis,  Clubbing or edema        Data Review:    Recent Labs  Lab 08/04/24 2042 08/04/24 2045 08/05/24 9386 08/05/24 0913 08/05/24 0915 08/06/24 0715 08/07/24 0558 08/08/24 0515  WBC 7.9  --  8.4  --   --  6.1 6.2 5.6  HGB 14.2   < > 13.9 14.6 13.6 13.5 13.0 13.3  HCT 43.8   < > 40.6 43.0 40.0 39.8 37.4* 39.5  PLT 241  --  222  --   --  214 236 246  MCV 93.2  --  89.4  --   --  88.6 88.2 89.2  MCH 30.2  --  30.6  --   --  30.1 30.7 30.0  MCHC 32.4  --  34.2  --   --  33.9 34.8 33.7  RDW 10.9*  --  11.1*  --   --  11.3* 11.0* 11.1*  LYMPHSABS 0.5*  --  1.1  --   --  1.3 1.0 1.2  MONOABS 0.2  --  0.4  --   --  0.8 0.7 0.5  EOSABS 0.0  --  0.0  --   --  0.0 0.0 0.1  BASOSABS 0.0  --  0.0  --   --  0.0 0.0 0.1   < > = values in this interval not displayed.    Recent Labs  Lab 08/04/24 2042 08/04/24 2045 08/05/24 9386 08/05/24 9348 08/05/24 0746 08/05/24 0913 08/05/24 0915 08/06/24 0715 08/07/24 0558 08/08/24 0515  NA 134* 135 133*  --   --  134* 134* 136 134* 134*  K 3.8 3.7 3.8  --   --  3.9 3.5 3.2* 3.3* 3.5  CL 95* 95* 98  --   --   --   --  99 99 100  CO2 22  --  22  --   --   --   --  23 21* 23  ANIONGAP 17*  --  13  --   --   --   --  14 14 11   GLUCOSE 140* 135* 130*  --   --   --   --  110* 111* 100*  BUN 6* 6* 7*  --   --   --   --  8 <5* 6*  CREATININE 1.02 0.80 1.00  --   --   --   --  0.93 0.87 0.85  AST 35  --  39  --   --   --   --  48* 40 36  ALT 38  --  47*  --   --   --   --  63* 54* 48*  ALKPHOS 78  --  72  --   --   --   --  67 60 58  BILITOT 1.1  --  1.2  --   --   --   --  1.3* 1.3* 1.6*  ALBUMIN 4.2  --  4.0  --   --   --   --  3.6 3.6 3.4*  CRP  --   --   --   --   --   --   --  1.4* 1.7* 0.9  PROCALCITON  --   --   --   --   --   --   --  <0.10 <0.10 <0.10  INR 1.0  --   --   --   --   --   --   --   --   --   TSH  --   --   --  1.023  --   --   --   --   --   --   HGBA1C  --   --   --   --  4.9  --   --   --   --   --   AMMONIA  --   --   --   18  --   --   --  20  --   --   MG 1.7  --  1.8  --   --   --   --   --  1.6* 2.3  CALCIUM  9.8  --  9.9  --   --   --   --  9.5 9.1 9.2      Recent Labs  Lab 08/04/24 2042 08/05/24 0613 08/05/24 0651 08/05/24 0746 08/06/24 0715 08/07/24 0558 08/08/24 0515  CRP  --   --   --   --  1.4* 1.7* 0.9  PROCALCITON  --   --   --   --  <0.10 <0.10 <0.10  INR 1.0  --   --   --   --   --   --   TSH  --   --  1.023  --   --   --   --   HGBA1C  --   --   --  4.9  --   --   --   AMMONIA  --   --  18  --  20  --   --   MG 1.7 1.8  --   --   --  1.6* 2.3  CALCIUM  9.8 9.9  --   --  9.5 9.1 9.2    --------------------------------------------------------------------------------------------------------------- Lab Results  Component Value Date   CHOL 216 (H) 08/05/2024   HDL 52 08/05/2024   LDLCALC 155 (H) 08/05/2024   TRIG 45 08/05/2024   CHOLHDL 4.2 08/05/2024    Lab Results  Component Value Date   HGBA1C 4.9 08/05/2024   No results for input(s): TSH, T4TOTAL, FREET4, T3FREE, THYROIDAB in the last 72 hours.  No results for input(s): VITAMINB12, FOLATE, FERRITIN, TIBC, IRON, RETICCTPCT in the last 72 hours.  ------------------------------------------------------------------------------------------------------------------ Cardiac Enzymes No results for input(s): CKMB, TROPONINI, MYOGLOBIN in the last 168 hours.  Invalid input(s): CK  Micro Results Recent Results (from the past 240 hours)  Culture, blood (Routine X 2) w Reflex to ID Panel     Status: None (Preliminary result)   Collection Time: 08/05/24 11:10 AM   Specimen: BLOOD LEFT ARM  Result Value Ref Range Status   Specimen Description BLOOD LEFT ARM  Final   Special Requests   Final  BOTTLES DRAWN AEROBIC AND ANAEROBIC Blood Culture results may not be optimal due to an inadequate volume of blood received in culture bottles   Culture   Final    NO GROWTH 3 DAYS Performed at [facility], [address], [place], [place] [address]    Report Status PENDING  Incomplete  CSF culture w Gram Stain     Status: None (Preliminary result)   Collection Time: 08/05/24 12:28 PM   Specimen: PATH Cytology CSF; Cerebrospinal Fluid  Result Value Ref Range Status   Specimen Description CSF  Final   Special Requests NONE  Final   Gram Stain NO WBC SEEN NO ORGANISMS SEEN CYTOSPIN SMEAR   Final   Culture   Final    NO GROWTH < 24 HOURS Performed at [facility], [address] [place], [place] [address]    Report Status PENDING  Incomplete  MRSA Next Gen by PCR, Nasal     Status: None   Collection Time: 08/05/24  9:11 PM   Specimen: Nasal Mucosa; Nasal Swab  Result Value Ref Range Status   MRSA by PCR Next Gen NOT DETECTED NOT DETECTED Final    Comment: (NOTE) The GeneXpert MRSA Assay (FDA approved for NASAL specimens only), is one component of a comprehensive MRSA colonization surveillance program. It is not intended to diagnose MRSA infection nor to guide or monitor treatment for MRSA infections. Test performance is not FDA approved in patients less than 79 years old. Performed at [facility], [address] [place], [place] [address]   Culture, blood (Routine X 2) w Reflex to ID Panel     Status: None (Preliminary result)   Collection Time: 08/06/24  7:15 AM   Specimen: BLOOD LEFT HAND  Result Value Ref Range Status   Specimen Description BLOOD LEFT HAND  Final   Special Requests   Final    BOTTLES DRAWN AEROBIC ONLY Blood Culture results may not be optimal due to an inadequate volume of blood received in culture bottles   Culture   Final    NO GROWTH 2 DAYS Performed at [facility], [address], [place], [place] [address]    Report Status PENDING  Incomplete  Respiratory (~20 pathogens) panel by PCR     Status: None   Collection Time: 08/06/24  7:51 AM   Specimen: Nasopharyngeal Swab; Respiratory  Result Value Ref Range Status    Adenovirus NOT DETECTED NOT DETECTED Final   Coronavirus 229E NOT DETECTED NOT DETECTED Final    Comment: (NOTE) The Coronavirus on the Respiratory Panel, DOES NOT test for the novel  Coronavirus (2019 nCoV)    Coronavirus HKU1 NOT DETECTED NOT DETECTED Final   Coronavirus NL63 NOT DETECTED NOT DETECTED Final   Coronavirus OC43 NOT DETECTED NOT DETECTED Final   Metapneumovirus NOT DETECTED NOT DETECTED Final   Rhinovirus / Enterovirus NOT DETECTED NOT DETECTED Final   Influenza A NOT DETECTED NOT DETECTED Final   Influenza B NOT DETECTED NOT DETECTED Final   Parainfluenza Virus 1 NOT DETECTED NOT DETECTED Final   Parainfluenza Virus 2 NOT DETECTED NOT DETECTED Final   Parainfluenza Virus 3 NOT DETECTED NOT DETECTED Final   Parainfluenza Virus 4 NOT DETECTED NOT DETECTED Final   Respiratory Syncytial Virus NOT DETECTED NOT DETECTED Final   Bordetella pertussis NOT DETECTED NOT DETECTED Final   Bordetella Parapertussis NOT DETECTED NOT DETECTED Final   Chlamydophila pneumoniae NOT DETECTED NOT DETECTED Final  Mycoplasma pneumoniae NOT DETECTED NOT DETECTED Final    Comment: Performed at [facility], [address] [place], [place] [address]    Radiology Report ECHOCARDIOGRAM COMPLETE Result Date: 08/07/2024    ECHOCARDIOGRAM REPORT   Patient Name:   JOSHUAL TERRIO Date of Exam: 08/07/2024 Medical Rec #:  [id number]    Height: Accession #:    [id number]   Weight:       167.5 lb Date of Birth:  08/29/1951    BSA:          1.794 m Patient Age:    73 years     BP:           137/81 mmHg Patient Gender: M            HR:           98 bpm. Exam Location:  Inpatient Procedure: 2D Echo, Cardiac Doppler and Color Doppler (Both Spectral and Color            Flow Doppler were utilized during procedure). Indications:    Stroke  History:        Patient has no prior history of Echocardiogram examinations.                 Signs/Symptoms:Altered Mental Status.  Sonographer:    Ellouise Mose RDCS Referring  Phys: [id number] [facility]  Sonographer Comments: No subcostal window. Image acquisition challenging due to uncooperative patient. Patient had been unable to stay still for exam on prevous day and was grabbing at tech. Attempted exam today and was interrupted by RN to give BP patch. Patient was startled and began grabbing at probe and pulled probe off chest. IMPRESSIONS  1. Technically difficult study -see sonographer's comments. No subcostal or suparsternal notch images.  2. Left ventricular ejection fraction, by estimation, is 55 to 60%. The left ventricle has normal function. The left ventricle has no regional wall motion abnormalities. There is moderate left ventricular hypertrophy. Indeterminate diastolic filling due  to E-A fusion.  3. Right ventricular systolic function is normal. The right ventricular size is normal. Tricuspid regurgitation signal is inadequate for assessing PA pressure.  4. The mitral valve is degenerative. Trivial mitral valve regurgitation. Trivial to mild mitral stenosis cannot be ruled out (MG 7.41mmHG, HR 104bpm) mitral stenosis. Severe mitral annular calcification.  5. The aortic valve is tricuspid. Aortic valve regurgitation is not visualized. Aortic valve sclerosis is present, with no evidence of aortic valve stenosis.  6. Aortic dilatation noted. There is dilatation of the ascending aorta, measuring 39 mm. Comparison(s): No prior Echocardiogram. Conclusion(s)/Recommendation(s): No intracardiac source of embolism detected on this transthoracic study. Consider a transesophageal echocardiogram to exclude cardiac source of embolism if clinically indicated. FINDINGS  Left Ventricle: Left ventricular ejection fraction, by estimation, is 55 to 60%. The left ventricle has normal function. The left ventricle has no regional wall motion abnormalities. The left ventricular internal cavity size was normal in size. There is  moderate left ventricular hypertrophy. Indeterminate diastolic filling  due to E-A fusion. Right Ventricle: The right ventricular size is normal. Right vetricular wall thickness was not assessed. Right ventricular systolic function is normal. Tricuspid regurgitation signal is inadequate for assessing PA pressure. Left Atrium: Left atrial size was normal in size. Right Atrium: Right atrial size was normal in size. Pericardium: There is no evidence of pericardial effusion. Mitral Valve: The mitral valve is degenerative in appearance. There is moderate thickening of the mitral valve leaflet(s). There is mild  calcification of the mitral valve leaflet(s). Mildly decreased mobility of the mitral valve leaflets. Severe mitral annular calcification. Trivial mitral valve regurgitation. Trivial to mild mitral stenosis cannot be ruled out (MG 7.89mmHG, HR 104bpm) mitral valve stenosis. MV peak gradient, 17.6 mmHg. The mean mitral valve gradient is 7.5 mmHg with average heart rate of 104 bpm. Tricuspid Valve: The tricuspid valve is grossly normal. Tricuspid valve regurgitation is not demonstrated. No evidence of tricuspid stenosis. Aortic Valve: The aortic valve is tricuspid. Aortic valve regurgitation is not visualized. Aortic valve sclerosis is present, with no evidence of aortic valve stenosis. Pulmonic Valve: The pulmonic valve was grossly normal. Pulmonic valve regurgitation is not visualized. No evidence of pulmonic stenosis. Aorta: The aortic root is normal in size and structure and aortic dilatation noted. There is dilatation of the ascending aorta, measuring 39 mm. Venous: The inferior vena cava was not well visualized. IAS/Shunts: The interatrial septum was not assessed.  LEFT VENTRICLE PLAX 2D LVIDd:         4.20 cm     Diastology LVIDs:         2.90 cm     LV e' medial:    8.81 cm/s LV PW:         1.40 cm     LV E/e' medial:  14.3 LV IVS:        1.70 cm     LV e' lateral:   7.83 cm/s LVOT diam:     2.50 cm     LV E/e' lateral: 16.1 LV SV:         81 LV SV Index:   45 LVOT Area:     4.91  cm  LV Volumes (MOD) LV vol d, MOD A4C: 79.2 ml LV vol s, MOD A4C: 37.6 ml LV SV MOD A4C:     79.2 ml LEFT ATRIUM           Index LA diam:      3.30 cm 1.84 cm/m LA Vol (A4C): 20.1 ml 11.20 ml/m  AORTIC VALVE LVOT Vmax:   108.00 cm/s LVOT Vmean:  72.500 cm/s LVOT VTI:    0.165 m  AORTA Ao Root diam: 3.80 cm Ao Asc diam:  3.90 cm MITRAL VALVE MV Area (PHT): 5.14 cm     SHUNTS MV Area VTI:   2.59 cm     Systemic VTI:  0.16 m MV Peak grad:  17.6 mmHg    Systemic Diam: 2.50 cm MV Mean grad:  7.5 mmHg MV Vmax:       2.09 m/s MV Vmean:      124.5 cm/s MV Decel Time: 148 msec MV E velocity: 126.00 cm/s MV A velocity: 199.00 cm/s MV E/A ratio:  0.63 Sunit Tolia Electronically signed by Madonna Large Signature Date/Time: 08/07/2024/11:20:43 AM    Final    Overnight EEG with video Result Date: 08/07/2024 Shelton Arlin KIDD, MD     08/07/2024  9:45 AM Patient Name: DELLA HOMAN MRN: [id number] Epilepsy Attending: Arlin KIDD Shelton Referring Physician/Provider: Voncile [place], MD Duration: 08/06/2024 1901 to 08/07/2024 0944  Patient history:  73 y.o. M who presents with right-sided weakness and difficulty speaking. EEG to evaluate for seizure  Level of alertness: awake  AEDs during EEG study: None  Technical aspects: This EEG study was done with scalp electrodes positioned according to the 10-20 International system of electrode placement. Electrical activity was reviewed with band pass filter of 1-70Hz , sensitivity of 7 uV/mm, display speed of 2mm/sec with  a 60Hz  notched filter applied as appropriate. EEG data were recorded continuously and digitally stored.  Video monitoring was available and reviewed as appropriate.  Description: The posterior dominant rhythm consists of 9 Hz activity of moderate voltage (25-35 uV) seen predominantly in posterior head regions, symmetric and reactive to eye opening and eye closing. EEG showed intermittent generalized 3 to 6 Hz theta-delta slowing. Hyperventilation and photic stimulation were  not performed.    ABNORMALITY - Intermittent slow, generalized  IMPRESSION: This study is suggestive of mild diffuse encephalopathy. No seizures or definite epileptiform discharges were seen throughout the recording.  Priyanka O Yadav    DG Fluoro Rm 1-60 Min - No Report Result Date: 08/06/2024 Fluoroscopy was utilized by the requesting physician.  No radiographic interpretation.     Signature  -   Lavada Stank M.D on 08/08/2024 at 8:27 AM   -  To page go to [email or web address]

## 2024-08-08 NOTE — Progress Notes (Signed)
 Clonidine  patch removed from pt's right arm

## 2024-08-08 NOTE — Progress Notes (Signed)
 Speech Language Pathology Treatment: Dysphagia  Patient Details Name: Bradley Hansen MRN: [id number] DOB: 1951/01/31 Today's Date: 08/08/2024 Time: 8891-8879 SLP Time Calculation (min) (ACUTE ONLY): 12 min  Assessment / Plan / Recommendation Clinical Impression  Pt presents with a functional oropharyngeal swallow per clinical swallow re-assessment completed today. Oral prep and transit prompt with complete oral clearance. Pharyngeal swallow initiation appeared timely with laryngeal elevation noted. No overt or subtle s/s of aspiration noted across consistencies.   Recommend advancement to a regular diet and thin liquids with PO meds as tolerated. No acute SLP needs identified at this time. SLP will sign off. Please re-consult if pt exhibits concerns for aspiration with PO intake.    HPI HPI: Pt is a 73 yo presenting with acute onset aphasia and R weakness. MRI limited but did not reveal an acute infarct. LP unremarkable. Initial CXR with possible PNA but repeat CXR 8/10 shows likely atelectasis. No pertinent PMHx      SLP Plan  Discharge SLP treatment due to (comment);All goals met          Recommendations  Diet recommendations: Regular;Thin liquid Liquids provided via: Straw;Cup Medication Administration: Whole meds with liquid Supervision: Patient able to self feed (set up assistance) Compensations: Slow rate;Small sips/bites Postural Changes and/or Swallow Maneuvers: Seated upright 90 degrees                  Oral care BID   None  (r/o dysphagia)     Discharge SLP treatment due to (comment);All goals met     Bradley Hansen  08/08/2024, 11:34 AM

## 2024-08-09 DIAGNOSIS — R4182 Altered mental status, unspecified: Secondary | ICD-10-CM | POA: Diagnosis not present

## 2024-08-09 DIAGNOSIS — R4701 Aphasia: Secondary | ICD-10-CM | POA: Diagnosis not present

## 2024-08-09 LAB — CBC WITH DIFFERENTIAL/PLATELET
Abs Immature Granulocytes: 0.01 K/uL (ref 0.00–0.07)
Basophils Absolute: 0.1 K/uL (ref 0.0–0.1)
Basophils Relative: 1 %
Eosinophils Absolute: 0.1 K/uL (ref 0.0–0.5)
Eosinophils Relative: 2 %
HCT: 38.2 % — ABNORMAL LOW (ref 39.0–52.0)
Hemoglobin: 13.1 g/dL (ref 13.0–17.0)
Immature Granulocytes: 0 %
Lymphocytes Relative: 30 %
Lymphs Abs: 1.3 K/uL (ref 0.7–4.0)
MCH: 30.4 pg (ref 26.0–34.0)
MCHC: 34.3 g/dL (ref 30.0–36.0)
MCV: 88.6 fL (ref 80.0–100.0)
Monocytes Absolute: 0.5 K/uL (ref 0.1–1.0)
Monocytes Relative: 12 %
Neutro Abs: 2.4 K/uL (ref 1.7–7.7)
Neutrophils Relative %: 55 %
Platelets: 248 K/uL (ref 150–400)
RBC: 4.31 MIL/uL (ref 4.22–5.81)
RDW: 11.1 % — ABNORMAL LOW (ref 11.5–15.5)
WBC: 4.3 K/uL (ref 4.0–10.5)
nRBC: 0 % (ref 0.0–0.2)

## 2024-08-09 LAB — CSF CULTURE W GRAM STAIN
Culture: NO GROWTH
Gram Stain: NONE SEEN

## 2024-08-09 LAB — COMPREHENSIVE METABOLIC PANEL WITH GFR
ALT: 37 U/L (ref 0–44)
AST: 27 U/L (ref 15–41)
Albumin: 3.2 g/dL — ABNORMAL LOW (ref 3.5–5.0)
Alkaline Phosphatase: 53 U/L (ref 38–126)
Anion gap: 8 (ref 5–15)
BUN: 9 mg/dL (ref 8–23)
CO2: 24 mmol/L (ref 22–32)
Calcium: 9.1 mg/dL (ref 8.9–10.3)
Chloride: 102 mmol/L (ref 98–111)
Creatinine, Ser: 0.87 mg/dL (ref 0.61–1.24)
GFR, Estimated: >60 mL/min (ref >60)
Glucose, Bld: 101 mg/dL — ABNORMAL HIGH (ref 70–99)
Potassium: 3.4 mmol/L — ABNORMAL LOW (ref 3.5–5.1)
Sodium: 134 mmol/L — ABNORMAL LOW (ref 135–145)
Total Bilirubin: 1.2 mg/dL (ref 0.0–1.2)
Total Protein: 6.8 g/dL (ref 6.5–8.1)

## 2024-08-09 LAB — PROCALCITONIN: Procalcitonin: <0.1 ng/mL

## 2024-08-09 LAB — C-REACTIVE PROTEIN: CRP: 0.6 mg/dL (ref <1.0)

## 2024-08-09 LAB — MAGNESIUM: Magnesium: 2.3 mg/dL (ref 1.7–2.4)

## 2024-08-09 LAB — RPR: RPR Ser Ql: NONREACTIVE

## 2024-08-09 MED ORDER — POTASSIUM CHLORIDE CRYS ER 20 MEQ PO TBCR
40.0000 meq | EXTENDED_RELEASE_TABLET | Freq: Once | ORAL | Status: AC
  Administered 2024-08-09 (×2): 40 meq via ORAL
  Filled 2024-08-09: qty 2

## 2024-08-09 NOTE — TOC Progression Note (Addendum)
 Transition of Care [facility]) - Progression Note    Patient Details  Name: [name] MRN: [id number] Date of Birth: 01/05/1951  [facility] CM/SW Contact  Waddell Barnie Rama, RN Phone Number: 08/09/2024, 4:09 PM  Clinical Narrative:    This NCM was informed by CSW that patient has declined SNF and would like HH.  NCM used [place] interpreter [phone] [place]) to speak with patient and wife in the room.  Niece says she does not know what is wrong with the patient and then the patient says he does not know what is wrong either.  I tried to explain to her about [facility] services, she states 30 to 40 minutes is not enough for him.  NCM explained SNF would be for 2 weeks short term.  She seemed very over whelmed.  I informed her would have doctor to come to speak with her.   Expected Discharge Plan: Home w Home Health Services Barriers to Discharge: Continued Medical Work up               Expected Discharge Plan and Services In-house Referral: Clinical Social Work   Post Acute Care Choice: Horticulturist, commercial, Home Health Living arrangements for the past 2 months: Single Family Home                                       Social Drivers of Health (SDOH) Interventions SDOH Screenings   Food Insecurity: No Food Insecurity (08/06/2024)  Housing: Low Risk  (08/06/2024)  Transportation Needs: No Transportation Needs (08/06/2024)  Utilities: Not At Risk (08/06/2024)  Social Connections: Unknown (08/06/2024)    Readmission Risk Interventions     No data to display

## 2024-08-09 NOTE — Plan of Care (Signed)
°  Problem: Health Behavior/Discharge Planning: Goal: Ability to manage health-related needs will improve Outcome: Progressing   Problem: Clinical Measurements: Goal: Ability to maintain clinical measurements within normal limits will improve Outcome: Progressing   Problem: Activity: Goal: Risk for activity intolerance will decrease Outcome: Progressing   Problem: Nutrition: Goal: Adequate nutrition will be maintained Outcome: Progressing   Problem: Elimination: Goal: Will not experience complications related to bowel motility Outcome: Progressing

## 2024-08-09 NOTE — Progress Notes (Signed)
 Occupational Therapy Treatment Patient Details Name: Bradley Hansen MRN: [id number] DOB: [birth year] Today's Date: 08/09/2024   History of present illness 73 y.o. male presents to [facility] 08/04/24 with expressive aphasia and R sided weakness. MRI brain pending. EEG suggestive of moderate diffuse encephalopathy. No pertinent PMHx   OT comments  Patient demonstrating good gains this treatment session. Patient able to get to EOB with mod assist and was CGA for sitting balance. Patient able to perform sit to stand with mod assist and step pivot transfer to chair to simulate toilet transfer with mod assist. Patient able to ambulate 4 feet to sink to perform grooming tasks in standing with mod assist to perform and for balance. Patient returned to EOB and mod assist to return to supine. OT recommends continued therapy but family have declined SNF and would like to take patient home. Patient would benefit from wheelchair at home due to limited mobility and HHOT to follow. Acute OT to continue to follow to address established goals to facilitate DC to next venue of care.        If plan is discharge home, recommend the following:  A lot of help with walking and/or transfers;A lot of help with bathing/dressing/bathroom;Assistance with cooking/housework;Direct supervision/assist for medications management;Direct supervision/assist for financial management;Assist for transportation;Help with stairs or ramp for entrance;Supervision due to cognitive status   Equipment Recommendations  BSC/3in1;Wheelchair (measurements OT);Wheelchair cushion (measurements OT)    Recommendations for Other Services      Precautions / Restrictions Precautions Precautions: Fall Recall of Precautions/Restrictions: Impaired Restrictions Weight Bearing Restrictions Per Provider Order: No       Mobility Bed Mobility Overal bed mobility: Needs Assistance Bed Mobility: Supine to Sit, Sit to Supine     Supine to sit: Mod assist Sit  to supine: Mod assist   General bed mobility comments: cues for technique and assistance to scoot forward on EOB and assistance with trunk to return to supine    Transfers Overall transfer level: Needs assistance Equipment used: Rolling walker (2 wheels) Transfers: Sit to/from Stand, Bed to chair/wheelchair/BSC Sit to Stand: Mod assist     Step pivot transfers: Mod assist     General transfer comment: cues for hand placement and mod assist to power up and for balance with mobility and transfers     Balance Overall balance assessment: Needs assistance Sitting-balance support: No upper extremity supported, Feet unsupported Sitting balance-Leahy Scale: Poor Sitting balance - Comments: CGA on EOB   Standing balance support: Single extremity supported, Bilateral upper extremity supported, During functional activity Standing balance-Leahy Scale: Poor Standing balance comment: reliant on external support when standing, required assistance for balance with one extremity support during grooming tasks                           ADL either performed or assessed with clinical judgement   ADL Overall ADL's : Needs assistance/impaired     Grooming: Wash/dry hands;Wash/dry face;Oral care;Moderate assistance;Standing;Cueing for sequencing Grooming Details (indicate cue type and reason): required assistance for standing balance, sequencing and to perform oral care         Upper Body Dressing : Moderate assistance;Sitting Upper Body Dressing Details (indicate cue type and reason): gown to cover back     Toilet Transfer: Moderate assistance;Ambulation;Rolling walker (2 wheels)             General ADL Comments: mod assist for sit to stands and transfer, cues for sequencing for all tasks  Extremity/Trunk Assessment              Occupational psychologist Communication: Impaired Factors Affecting Communication: Reduced  clarity of speech   Cognition Arousal: Alert Behavior During Therapy: Flat affect, Impulsive Cognition: Cognition impaired   Orientation impairments: Time, Situation Awareness: Intellectual awareness impaired, Online awareness impaired Memory impairment (select all impairments): Short-term memory, Working memory Attention impairment (select first level of impairment): Sustained attention Executive functioning impairment (select all impairments): Initiation, Sequencing OT - Cognition Comments: required cues for sequencing                 Following commands: Impaired Following commands impaired: Follows one step commands with increased time, Follows one step commands inconsistently      Cueing   Cueing Techniques: Verbal cues, Gestural cues, Tactile cues  Exercises      Shoulder Instructions       General Comments VSS on RA, family present during session and supportive    Pertinent Vitals/ Pain       Pain Assessment Pain Assessment: No/denies pain Pain Intervention(s): Monitored during session  Home Living                                          Prior Functioning/Environment              Frequency  Min 2X/week        Progress Toward Goals  OT Goals(current goals can now be found in the care plan section)  Progress towards OT goals: Progressing toward goals  Acute Rehab OT Goals Patient Stated Goal: to go home OT Goal Formulation: With patient Time For Goal Achievement: 08/19/24 Potential to Achieve Goals: Good ADL Goals Pt Will Perform Grooming: with mod assist Pt Will Perform Upper Body Dressing: with mod assist Additional ADL Goal #1: Pt will follow simple 1 step commands 50% of attempts Additional ADL Goal #2: Pt will maintain arousal with min cues  Plan      Co-evaluation                 AM-PAC OT 6 Clicks Daily Activity     Outcome Measure   Help from another person eating meals?: A Little Help from another  person taking care of personal grooming?: A Lot Help from another person toileting, which includes using toliet, bedpan, or urinal?: A Lot Help from another person bathing (including washing, rinsing, drying)?: A Lot Help from another person to put on and taking off regular upper body clothing?: A Lot Help from another person to put on and taking off regular lower body clothing?: A Lot 6 Click Score: 13    End of Session Equipment Utilized During Treatment: Gait belt;Rolling walker (2 wheels)  OT Visit Diagnosis: Unsteadiness on feet (R26.81);Other abnormalities of gait and mobility (R26.89);Muscle weakness (generalized) (M62.81)   Activity Tolerance Patient tolerated treatment well   Patient Left in bed;with call bell/phone within reach;with bed alarm set;with family/visitor present   Nurse Communication Mobility status        Time: 1220-1250 OT Time Calculation (min): 30 min  Charges: OT General Charges $OT Visit: 1 Visit OT Treatments $Self Care/Home Management : 23-37 mins  Dick Laine, OTA Acute Rehabilitation Services  Office [phone]   Jeb LITTIE Laine 08/09/2024, 1:50 PM

## 2024-08-09 NOTE — Progress Notes (Signed)
 PROGRESS NOTE                                                                                                                                                                                                             Patient Demographics:    Bradley Hansen, is a 73 y.o. male, DOB - 1951-08-06, FMW:[id number]  Outpatient Primary MD for the patient is Pcp, No    LOS - 4  Admit date - 08/04/2024    Chief Complaint  Patient presents with   Code Stroke       Brief Narrative (HPI from H&P)    73 y.o. male with no reported significant past medical history, per patient's wife, patient has no known significant past medical history, she reports that around noon today, the patient became acutely confused and aphasic, prompting the patient be brought to [facility] emergency department for further evaluation management [facility] did not note any tonic-clonic activity nor any tongue biting or loss of bowel/bladder function.   Subjective:   Patient in bed, appears comfortable, denies any headache, no fever, no chest pain or pressure, no shortness of breath , no abdominal pain. No focal weakness.   Assessment  & Plan :    Acute onset of metabolic encephalopathy and expressive aphasia.  No significant past medical history, no focal deficits, neurology on board, MRI head, CTA head and neck and EEG unremarkable,  LP was finally done successfully on 08/06/2024 by IR, CSF preliminary me does not appear to be consistent with meningitis, meningitis panel and respiratory viral panel negative, currently continues to be on antibiotics and acyclovir  have not requested neuro to consider discontinuing antibiotics and acyclovir .  Will defer it to them.  Long-term EEG also appears to be unremarkable.  Clinically definitely has slow but sustained improvement.  Talking to patient's wife this appears to be somewhat of a chronic problem which is gradually  progressive question if he has undiagnosed dementia, again will defer this to neurology.  Continue supportive care with IV fluids, PT-OT and speech follow-up.  Hopefully we will be able to advance his diet on 08/08/2024, able to follow basic commands and take oral medications for now.  Questionable pneumonia on chest x-ray at the time of admission, SLP to evaluate, gentle IV fluids, antibiotics for above will suffice.  Dyslipidemia.  On statin at home  will resume once acute conditions stabilize.  Hypertension.  Took some oral pills, blood pressure improved, discontinue Catapres  patch and monitor.  Hypokalemia and hypomagnesemia.  Replaced.    Nonspecific CTA findings in the vertebral arteries.  Secondary prevention per PCP, statin and aspirin  in the outpatient setting when able to.      Condition - Extremely Guarded  Family Communication  : Wife bedside on 08/06/2024, 08/07/2024 states that patient has been getting somewhat confused off and on for several months  Code Status : Full code  Consults  : Neurology, IR  PUD Prophylaxis :     Procedures  :     LP third attempt 08/06/2024 by IR.    MRI brain, EEG, CTA head and neck.  Nonacute.      Disposition Plan  :    Status is: Inpatient   DVT Prophylaxis  :    SCDs Start: 08/04/24 2243    Lab Results  Component Value Date   PLT 248 08/09/2024    Diet :  Diet Order             Diet regular Fluid consistency: Thin  Diet effective now                    Inpatient Medications  Scheduled Meds:  amLODipine   10 mg Oral Daily   aspirin  EC  81 mg Oral Daily   Or   aspirin   150 mg Rectal Daily   carvedilol   3.125 mg Oral BID WC   rosuvastatin   10 mg Oral Daily   sodium chloride  flush  3 mL Intravenous Once   Continuous Infusions:   PRN Meds:.acetaminophen  **OR** acetaminophen , haloperidol  lactate, hydrALAZINE , labetalol , ondansetron  (ZOFRAN ) IV  Antibiotics  :    Anti-infectives (From admission, onward)     Start     Dose/Rate Route Frequency Ordered Stop   08/06/24 0100  vancomycin  (VANCOCIN ) IVPB 1000 mg/200 mL premix  Status:  Discontinued        1,000 mg 200 mL/hr over 60 Minutes Intravenous Every 12 hours 08/05/24 1156 08/07/24 1051   08/05/24 1915  ampicillin  (OMNIPEN) 2 g in sodium chloride  0.9 % 100 mL IVPB  Status:  Discontinued        2 g 300 mL/hr over 20 Minutes Intravenous Every 4 hours 08/05/24 1533 08/07/24 1050   08/05/24 1200  ampicillin  (OMNIPEN) 2 g in sodium chloride  0.9 % 100 mL IVPB  Status:  Discontinued        2 g 300 mL/hr over 20 Minutes Intravenous Every 4 hours 08/05/24 1119 08/05/24 1533   08/05/24 1200  acyclovir  (ZOVIRAX ) 750 mg in dextrose  5 % 250 mL IVPB  Status:  Discontinued        10 mg/kg  75 kg 265 mL/hr over 60 Minutes Intravenous Every 8 hours 08/05/24 1154 08/07/24 1050   08/05/24 1130  cefTRIAXone  (ROCEPHIN ) 2 g in sodium chloride  0.9 % 100 mL IVPB  Status:  Discontinued        2 g 200 mL/hr over 30 Minutes Intravenous Every 12 hours 08/05/24 1119 08/07/24 1050   08/05/24 1130  vancomycin  (VANCOREADY) IVPB 1500 mg/300 mL        1,500 mg 150 mL/hr over 120 Minutes Intravenous  Once 08/05/24 1119 08/05/24 1754         Objective:   Vitals:   08/08/24 2200 08/09/24 0000 08/09/24 0327 08/09/24 0749  BP: (!) 164/110 (!) 154/85 (!) 141/78 104/72  Pulse:  88  91 81  Resp: (!) 25 (!) 23 19 11   Temp:  98.6 F (37 C) 98.2 F (36.8 C) 98.2 F (36.8 C)  TempSrc:  Oral Oral Oral  SpO2:  100% 99% 93%  Weight:   74.6 kg     Wt Readings from Last 3 Encounters:  08/09/24 74.6 kg     Intake/Output Summary (Last 24 hours) at 08/09/2024 0756 Last data filed at 08/09/2024 0751 Gross per 24 hour  Intake 240 ml  Output 970 ml  Net -730 ml     Physical Exam  Awake, confused but improved from before, no new F.N deficits, Normal affect [place].AT,PERRAL Supple Neck, No JVD,   Symmetrical Chest wall movement, Good air movement bilaterally,  CTAB RRR,No Gallops,Rubs or new Murmurs,  +ve B.Sounds, Abd Soft, No tenderness,   No Cyanosis, Clubbing or edema        Data Review:    Recent Labs  Lab 08/05/24 0613 08/05/24 0913 08/05/24 0915 08/06/24 0715 08/07/24 0558 08/08/24 0515 08/09/24 0429  WBC 8.4  --   --  6.1 6.2 5.6 4.3  HGB 13.9   < > 13.6 13.5 13.0 13.3 13.1  HCT 40.6   < > 40.0 39.8 37.4* 39.5 38.2*  PLT 222  --   --  214 236 246 248  MCV 89.4  --   --  88.6 88.2 89.2 88.6  MCH 30.6  --   --  30.1 30.7 30.0 30.4  MCHC 34.2  --   --  33.9 34.8 33.7 34.3  RDW 11.1*  --   --  11.3* 11.0* 11.1* 11.1*  LYMPHSABS 1.1  --   --  1.3 1.0 1.2 1.3  MONOABS 0.4  --   --  0.8 0.7 0.5 0.5  EOSABS 0.0  --   --  0.0 0.0 0.1 0.1  BASOSABS 0.0  --   --  0.0 0.0 0.1 0.1   < > = values in this interval not displayed.    Recent Labs  Lab 08/04/24 2042 08/04/24 2045 08/05/24 9386 08/05/24 9348 08/05/24 0746 08/05/24 0913 08/05/24 0915 08/06/24 0715 08/07/24 0558 08/08/24 0515 08/09/24 0429  NA 134*   < > 133*  --   --    < > 134* 136 134* 134* 134*  K 3.8   < > 3.8  --   --    < > 3.5 3.2* 3.3* 3.5 3.4*  CL 95*   < > 98  --   --   --   --  99 99 100 102  CO2 22  --  22  --   --   --   --  23 21* 23 24  ANIONGAP 17*  --  13  --   --   --   --  14 14 11 8   GLUCOSE 140*   < > 130*  --   --   --   --  110* 111* 100* 101*  BUN 6*   < > 7*  --   --   --   --  8 <5* 6* 9  CREATININE 1.02   < > 1.00  --   --   --   --  0.93 0.87 0.85 0.87  AST 35  --  39  --   --   --   --  48* 40 36 27  ALT 38  --  47*  --   --   --   --  63* 54* 48* 37  ALKPHOS 78  --  72  --   --   --   --  67 60 58 53  BILITOT 1.1  --  1.2  --   --   --   --  1.3* 1.3* 1.6* 1.2  ALBUMIN 4.2  --  4.0  --   --   --   --  3.6 3.6 3.4* 3.2*  CRP  --   --   --   --   --   --   --  1.4* 1.7* 0.9 0.6  PROCALCITON  --   --   --   --   --   --   --  <0.10 <0.10 <0.10 <0.10  INR 1.0  --   --   --   --   --   --   --   --   --   --   TSH  --   --   --   1.023  --   --   --   --   --   --   --   HGBA1C  --   --   --   --  4.9  --   --   --   --   --   --   AMMONIA  --   --   --  18  --   --   --  20  --   --   --   MG 1.7  --  1.8  --   --   --   --   --  1.6* 2.3 2.3  CALCIUM  9.8  --  9.9  --   --   --   --  9.5 9.1 9.2 9.1   < > = values in this interval not displayed.      Recent Labs  Lab 08/04/24 2042 08/05/24 9386 08/05/24 9348 08/05/24 0746 08/06/24 0715 08/07/24 0558 08/08/24 0515 08/09/24 0429  CRP  --   --   --   --  1.4* 1.7* 0.9 0.6  PROCALCITON  --   --   --   --  <0.10 <0.10 <0.10 <0.10  INR 1.0  --   --   --   --   --   --   --   TSH  --   --  1.023  --   --   --   --   --   HGBA1C  --   --   --  4.9  --   --   --   --   AMMONIA  --   --  18  --  20  --   --   --   MG 1.7 1.8  --   --   --  1.6* 2.3 2.3  CALCIUM  9.8 9.9  --   --  9.5 9.1 9.2 9.1    --------------------------------------------------------------------------------------------------------------- Lab Results  Component Value Date   CHOL 216 (H) 08/05/2024   HDL 52 08/05/2024   LDLCALC 155 (H) 08/05/2024   TRIG 45 08/05/2024   CHOLHDL 4.2 08/05/2024    Lab Results  Component Value Date   HGBA1C 4.9 08/05/2024   No results for input(s): TSH, T4TOTAL, FREET4, T3FREE, THYROIDAB in the last 72 hours.  No results for input(s): VITAMINB12, FOLATE, FERRITIN, TIBC, IRON, RETICCTPCT in the last 72 hours.  ------------------------------------------------------------------------------------------------------------------ Cardiac Enzymes No results for input(s): CKMB, TROPONINI, MYOGLOBIN in the last 168  hours.  Invalid input(s): CK  Micro Results Recent Results (from the past 240 hours)  Culture, blood (Routine X 2) w Reflex to ID Panel     Status: None (Preliminary result)   Collection Time: 08/05/24 11:10 AM   Specimen: BLOOD LEFT ARM  Result Value Ref Range Status   Specimen Description BLOOD LEFT ARM  Final    Special Requests   Final    BOTTLES DRAWN AEROBIC AND ANAEROBIC Blood Culture results may not be optimal due to an inadequate volume of blood received in culture bottles   Culture   Final    NO GROWTH 4 DAYS Performed at [facility], [address], [place], [place] [address]    Report Status PENDING  Incomplete  CSF culture w Gram Stain     Status: None (Preliminary result)   Collection Time: 08/05/24 12:28 PM   Specimen: PATH Cytology CSF; Cerebrospinal Fluid  Result Value Ref Range Status   Specimen Description CSF  Final   Special Requests NONE  Final   Gram Stain NO WBC SEEN NO ORGANISMS SEEN CYTOSPIN SMEAR   Final   Culture   Final    NO GROWTH 2 DAYS Performed at [facility], [address] [place], [place] [address]    Report Status PENDING  Incomplete  MRSA Next Gen by PCR, Nasal     Status: None   Collection Time: 08/05/24  9:11 PM   Specimen: Nasal Mucosa; Nasal Swab  Result Value Ref Range Status   MRSA by PCR Next Gen NOT DETECTED NOT DETECTED Final    Comment: (NOTE) The GeneXpert MRSA Assay (FDA approved for NASAL specimens only), is one component of a comprehensive MRSA colonization surveillance program. It is not intended to diagnose MRSA infection nor to guide or monitor treatment for MRSA infections. Test performance is not FDA approved in patients less than 75 years old. Performed at [facility], [address] [place], [place] [address]   Culture, blood (Routine X 2) w Reflex to ID Panel     Status: None (Preliminary result)   Collection Time: 08/06/24  7:15 AM   Specimen: BLOOD LEFT HAND  Result Value Ref Range Status   Specimen Description BLOOD LEFT HAND  Final   Special Requests   Final    BOTTLES DRAWN AEROBIC ONLY Blood Culture results may not be optimal due to an inadequate volume of blood received in culture bottles   Culture   Final    NO GROWTH 3 DAYS Performed at [facility], [address], [place],  [place] [address]    Report Status PENDING  Incomplete  Respiratory (~20 pathogens) panel by PCR     Status: None   Collection Time: 08/06/24  7:51 AM   Specimen: Nasopharyngeal Swab; Respiratory  Result Value Ref Range Status   Adenovirus NOT DETECTED NOT DETECTED Final   Coronavirus 229E NOT DETECTED NOT DETECTED Final    Comment: (NOTE) The Coronavirus on the Respiratory Panel, DOES NOT test for the novel  Coronavirus (2019 nCoV)    Coronavirus HKU1 NOT DETECTED NOT DETECTED Final   Coronavirus NL63 NOT DETECTED NOT DETECTED Final   Coronavirus OC43 NOT DETECTED NOT DETECTED Final   Metapneumovirus NOT DETECTED NOT DETECTED Final   Rhinovirus / Enterovirus NOT DETECTED NOT DETECTED Final   Influenza A NOT DETECTED NOT DETECTED Final   Influenza B NOT DETECTED NOT DETECTED Final   Parainfluenza Virus 1 NOT DETECTED  NOT DETECTED Final   Parainfluenza Virus 2 NOT DETECTED NOT DETECTED Final   Parainfluenza Virus 3 NOT DETECTED NOT DETECTED Final   Parainfluenza Virus 4 NOT DETECTED NOT DETECTED Final   Respiratory Syncytial Virus NOT DETECTED NOT DETECTED Final   Bordetella pertussis NOT DETECTED NOT DETECTED Final   Bordetella Parapertussis NOT DETECTED NOT DETECTED Final   Chlamydophila pneumoniae NOT DETECTED NOT DETECTED Final   Mycoplasma pneumoniae NOT DETECTED NOT DETECTED Final    Comment: Performed at [facility], [address], [place], [place] [address]    Radiology Report ECHOCARDIOGRAM COMPLETE Result Date: 08/07/2024    ECHOCARDIOGRAM REPORT   Patient Name:   Bradley Hansen Date of Exam: 08/07/2024 Medical Rec #:  [id number]    Height: Accession #:    [id number]   Weight:       167.5 lb Date of Birth:  1951-02-13    BSA:          1.794 m Patient Age:    73 years     BP:           137/81 mmHg Patient Gender: M            HR:           98 bpm. Exam Location:  Inpatient Procedure: 2D Echo, Cardiac Doppler and Color Doppler (Both Spectral and Color            Flow Doppler were  utilized during procedure). Indications:    Stroke  History:        Patient has no prior history of Echocardiogram examinations.                 Signs/Symptoms:Altered Mental Status.  Sonographer:    Ellouise Mose RDCS Referring Phys: [id number] [facility]  Sonographer Comments: No subcostal window. Image acquisition challenging due to uncooperative patient. Patient had been unable to stay still for exam on prevous day and was grabbing at tech. Attempted exam today and was interrupted by RN to give BP patch. Patient was startled and began grabbing at probe and pulled probe off chest. IMPRESSIONS  1. Technically difficult study -see sonographer's comments. No subcostal or suparsternal notch images.  2. Left ventricular ejection fraction, by estimation, is 55 to 60%. The left ventricle has normal function. The left ventricle has no regional wall motion abnormalities. There is moderate left ventricular hypertrophy. Indeterminate diastolic filling due  to E-A fusion.  3. Right ventricular systolic function is normal. The right ventricular size is normal. Tricuspid regurgitation signal is inadequate for assessing PA pressure.  4. The mitral valve is degenerative. Trivial mitral valve regurgitation. Trivial to mild mitral stenosis cannot be ruled out (MG 7.2mmHG, HR 104bpm) mitral stenosis. Severe mitral annular calcification.  5. The aortic valve is tricuspid. Aortic valve regurgitation is not visualized. Aortic valve sclerosis is present, with no evidence of aortic valve stenosis.  6. Aortic dilatation noted. There is dilatation of the ascending aorta, measuring 39 mm. Comparison(s): No prior Echocardiogram. Conclusion(s)/Recommendation(s): No intracardiac source of embolism detected on this transthoracic study. Consider a transesophageal echocardiogram to exclude cardiac source of embolism if clinically indicated. FINDINGS  Left Ventricle: Left ventricular ejection fraction, by estimation, is 55 to 60%. The left ventricle  has normal function. The left ventricle has no regional wall motion abnormalities. The left ventricular internal cavity size was normal in size. There is  moderate left ventricular hypertrophy. Indeterminate diastolic filling due to E-A fusion. Right Ventricle: The right ventricular  size is normal. Right vetricular wall thickness was not assessed. Right ventricular systolic function is normal. Tricuspid regurgitation signal is inadequate for assessing PA pressure. Left Atrium: Left atrial size was normal in size. Right Atrium: Right atrial size was normal in size. Pericardium: There is no evidence of pericardial effusion. Mitral Valve: The mitral valve is degenerative in appearance. There is moderate thickening of the mitral valve leaflet(s). There is mild calcification of the mitral valve leaflet(s). Mildly decreased mobility of the mitral valve leaflets. Severe mitral annular calcification. Trivial mitral valve regurgitation. Trivial to mild mitral stenosis cannot be ruled out (MG 7.37mmHG, HR 104bpm) mitral valve stenosis. MV peak gradient, 17.6 mmHg. The mean mitral valve gradient is 7.5 mmHg with average heart rate of 104 bpm. Tricuspid Valve: The tricuspid valve is grossly normal. Tricuspid valve regurgitation is not demonstrated. No evidence of tricuspid stenosis. Aortic Valve: The aortic valve is tricuspid. Aortic valve regurgitation is not visualized. Aortic valve sclerosis is present, with no evidence of aortic valve stenosis. Pulmonic Valve: The pulmonic valve was grossly normal. Pulmonic valve regurgitation is not visualized. No evidence of pulmonic stenosis. Aorta: The aortic root is normal in size and structure and aortic dilatation noted. There is dilatation of the ascending aorta, measuring 39 mm. Venous: The inferior vena cava was not well visualized. IAS/Shunts: The interatrial septum was not assessed.  LEFT VENTRICLE PLAX 2D LVIDd:         4.20 cm     Diastology LVIDs:         2.90 cm     LV e'  medial:    8.81 cm/s LV PW:         1.40 cm     LV E/e' medial:  14.3 LV IVS:        1.70 cm     LV e' lateral:   7.83 cm/s LVOT diam:     2.50 cm     LV E/e' lateral: 16.1 LV SV:         81 LV SV Index:   45 LVOT Area:     4.91 cm  LV Volumes (MOD) LV vol d, MOD A4C: 79.2 ml LV vol s, MOD A4C: 37.6 ml LV SV MOD A4C:     79.2 ml LEFT ATRIUM           Index LA diam:      3.30 cm 1.84 cm/m LA Vol (A4C): 20.1 ml 11.20 ml/m  AORTIC VALVE LVOT Vmax:   108.00 cm/s LVOT Vmean:  72.500 cm/s LVOT VTI:    0.165 m  AORTA Ao Root diam: 3.80 cm Ao Asc diam:  3.90 cm MITRAL VALVE MV Area (PHT): 5.14 cm     SHUNTS MV Area VTI:   2.59 cm     Systemic VTI:  0.16 m MV Peak grad:  17.6 mmHg    Systemic Diam: 2.50 cm MV Mean grad:  7.5 mmHg MV Vmax:       2.09 m/s MV Vmean:      124.5 cm/s MV Decel Time: 148 msec MV E velocity: 126.00 cm/s MV A velocity: 199.00 cm/s MV E/A ratio:  0.63 Sunit Tolia Electronically signed by Madonna Large Signature Date/Time: 08/07/2024/11:20:43 AM    Final    Overnight EEG with video Result Date: 08/07/2024 Shelton Arlin KIDD, MD     08/07/2024  9:45 AM Patient Name: Bradley Hansen MRN: [id number] Epilepsy Attending: Arlin KIDD Shelton Referring Physician/Provider: Voncile Isles, MD Duration: 08/06/2024 1901 to 08/07/2024  9055  Patient history:  73 y.o. M who presents with right-sided weakness and difficulty speaking. EEG to evaluate for seizure  Level of alertness: awake  AEDs during EEG study: None  Technical aspects: This EEG study was done with scalp electrodes positioned according to the 10-20 International system of electrode placement. Electrical activity was reviewed with band pass filter of 1-70Hz , sensitivity of 7 uV/mm, display speed of 72mm/sec with a 60Hz  notched filter applied as appropriate. EEG data were recorded continuously and digitally stored.  Video monitoring was available and reviewed as appropriate.  Description: The posterior dominant rhythm consists of 9 Hz activity of moderate  voltage (25-35 uV) seen predominantly in posterior head regions, symmetric and reactive to eye opening and eye closing. EEG showed intermittent generalized 3 to 6 Hz theta-delta slowing. Hyperventilation and photic stimulation were not performed.    ABNORMALITY - Intermittent slow, generalized  IMPRESSION: This study is suggestive of mild diffuse encephalopathy. No seizures or definite epileptiform discharges were seen throughout the recording.  Priyanka O Yadav      Signature  -   Lavada Stank M.D on 08/09/2024 at 7:56 AM   -  To page go to [email or web address]

## 2024-08-09 NOTE — NC FL2 (Addendum)
 [place]  MEDICAID FL2 LEVEL OF CARE FORM     IDENTIFICATION  Patient Name: Bradley Hansen Birthdate: 09/18/1951 Sex: male Admission Date (Current Location): 08/04/2024  [facility] and [place] Number:  Producer, television/film/video and Address:  [facility] [place], [address] [place], [place] [address]      Provider Number: [id number]  Attending Physician Name and Address:  Dennise Lavada POUR, MD  Relative Name and Phone Number:       Current Level of Care: Hospital Recommended Level of Care: Skilled Nursing Facility Prior Approval Number:    Date Approved/Denied:   PASRR Number: [id number]  Discharge Plan: SNF    Current Diagnoses: Patient Active Problem List   Diagnosis Date Noted   Acute encephalopathy 08/05/2024   Altered mental status 08/05/2024   Expressive aphasia 08/04/2024    Orientation RESPIRATION BLADDER Height & Weight     Self  Normal Incontinent Weight: 164 lb 7.4 oz (74.6 kg) Height:     BEHAVIORAL SYMPTOMS/MOOD NEUROLOGICAL BOWEL NUTRITION STATUS      Continent Diet (See dc summary)  AMBULATORY STATUS COMMUNICATION OF NEEDS Skin   Extensive Assist Verbally Normal                       Personal Care Assistance Level of Assistance  Bathing, Feeding, Dressing Bathing Assistance: Maximum assistance Feeding assistance: Limited assistance Dressing Assistance: Maximum assistance     Functional Limitations Info             SPECIAL CARE FACTORS FREQUENCY  PT (By licensed PT), OT (By licensed OT)     PT Frequency: 5x/week OT Frequency: 5x/week            Contractures Contractures Info: Not present    Additional Factors Info  Code Status, Allergies Code Status Info: Full Allergies Info: NKA           Current Medications (08/09/2024):  This is the current hospital active medication list Current Facility-Administered Medications  Medication Dose Route Frequency Provider Last Rate Last Admin   acetaminophen  (TYLENOL )  tablet 650 mg  650 mg Oral Q6H PRN Howerter, Justin B, DO       Or   acetaminophen  (TYLENOL ) suppository 650 mg  650 mg Rectal Q6H PRN Howerter, Justin B, DO   650 mg at 08/06/24 1418   amLODipine  (NORVASC ) tablet 10 mg  10 mg Oral Daily Singh, Prashant K, MD   10 mg at 08/09/24 0947   aspirin  EC tablet 81 mg  81 mg Oral Daily Michaela Aisha SQUIBB, MD   81 mg at 08/09/24 0947   carvedilol  (COREG ) tablet 3.125 mg  3.125 mg Oral BID WC Singh, Prashant K, MD   3.125 mg at 08/08/24 1602   haloperidol  lactate (HALDOL ) injection 2 mg  2 mg Intravenous Q6H PRN Singh, Prashant K, MD   2 mg at 08/09/24 0305   hydrALAZINE  (APRESOLINE ) injection 10 mg  10 mg Intravenous Q4H PRN Howerter, Justin B, DO       labetalol  (NORMODYNE ) injection 10 mg  10 mg Intravenous Q2H PRN Singh, Prashant K, MD   10 mg at 08/08/24 0008   ondansetron  (ZOFRAN ) injection 4 mg  4 mg Intravenous Q6H PRN Howerter, Justin B, DO       rosuvastatin  (CRESTOR ) tablet 10 mg  10 mg Oral Daily Singh, Prashant K, MD   10 mg at 08/09/24 0947   sodium chloride  flush (NS) 0.9 % injection 3 mL  3 mL Intravenous Once Trifan, Donnice PARAS, MD         Discharge Medications: Please see discharge summary for a list of discharge medications.  Relevant Imaging Results:  Relevant Lab Results:   Additional Information SSN: [id number]. [employer] [place]  Inocente GORMAN Kindle, LCSW

## 2024-08-09 NOTE — TOC Initial Note (Signed)
 Transition of Care [facility]) - Initial/Assessment Note    Patient Details  Name: Bradley Hansen MRN: [id number] Date of Birth: 1951-08-04  Transition of Care [facility]) CM/SW Contact:    Inocente GORMAN Kindle, LCSW Phone Number: 08/09/2024, 11:35 AM  Clinical Narrative:                 CSW received consult for possible SNF placement at time of discharge. CSW spoke with patient's spouse and niece at bedside. Patient was sleeping. CSW discussed SNF versus home health and they requested that they prefer to take patient home with home health rather than SNF. CSW described limitations of home health services. They reported being in agreement to receive a walker or wheelchair and BSC. Will consult with OT on DME needs but currently recommending a wheelchair. Niece requested CSW return when patient is awake to discuss with him. CSW did caution that patient is still disoriented and family would need to assist in his decision making.        Skilled Nursing Rehab Facilities-   [email or web address]   Ratings out of 5 stars (5 the highest)  Name Address  Phone # Quality Care Staffing Health Inspection Overall  [facility] [address] [phone] 2 1 2 1   [facility] [address], [place] [phone] 5 2 4 5   [facility] [address], [facility] [phone] 2 1 1 1   [facility] [address] [place] [phone] 4 3 4 4   [facility]  [address], [place] [phone] 4 3 5 5   [facility] [address], [facility] [phone] 5 3 2 3   [facility] [address], [place] [phone] 5 1 2 2   [facility] [address], [place] [phone] 3 4 4 4   [address] ([facility]) [address], [place] [phone] 1 3 3 2   [facility] [address] [place], [place] [phone] 4 2 2 2   [facility] ([place]) [address], [place] [phone] 2 1 1 1    Clotilda Pereyra [address] [place] [phone] 2 4 4 4   [facility] [address] [place] [phone] 3 1 3 2   [facility] ([facility]) [address], [place] [phone] 1 2 4 3           [facility] [address] [place] [phone] 3 1 5 4   [facility] [address], [place] [phone] 4 2 1 1   [facility]  [address] [place] [phone] 2 4 1 1   [facility] [address] [phone] 2 2 5 5   [facility] [address] [place] [address], [place] [phone] 2 2 3 3           [facility] [address], [place] [phone] 2 1 2 1   [facility] (No [facility]) [address], [place] [phone] 4 3 4 4   [facility] [address] [facility] [phone] 3  5 5   [facility] [address], [place] [phone] 4 2 1 1   Marionette Milian [address] [place] [phone] 3 1 2 1   [facility] [address] [place] [phone] 1 3 3 2   [facility] [address] [place] [phone] 2 2 3 3   [facility] [address] [place], [place] [phone] 2 1 4 3   [facility] [address], [place] [phone] 2 1 1 1   [facility] [phone] [place], [place] [phone] 2 1 2 1   [facility] [address] [place] [phone] 3 1 2 1           [facility] [address], [place] [phone] 4 1 3 2   [facility] [address] [place]  [phone] 2 4 4 4   [facility] (No [employer]) [address] [place], [place] [phone] 3 2 5 5   [facility]) [address], [place] [phone] 3 2 3 3   [facility] [address] [place] [phone] 5 3 5 5   [facility] [address], [place] [phone] 2 1 1 1   [facility] [address] [place], [place] [phone] 3 5 5 5           [facility] [address]  [place], [place] [phone] 5 4 5 5   [facility])  [address] [place] [phone] 1 1 2 1   [facility]) [address] [place] [phone]  2 4 4   [facility] [address] [place] [phone] 3 5 5 5   [address] [place], [place] [phone] 4 2 2 2   [facility]) [address] [place] [phone] 1 1 3 1   [facility] [address], [place] [phone] 2 2 2 2     Expected Discharge Plan: Home w Home Health Services Barriers to Discharge: Continued Medical Work up   Patient Goals and CMS Choice Patient states their goals for this hospitalization and ongoing recovery are:: Return home CMS Medicare.gov Compare Post Acute Care list provided to:: Patient Represenative (must comment) Choice offered to / list presented to : Spouse [place] ownership interest in [facility].provided to:: Spouse    Expected Discharge Plan and Services In-house Referral: Clinical Social Work   Post Acute Care Choice: Horticulturist, commercial, Home Health Living arrangements for the past 2 months: Single Family Home                                      Prior Living Arrangements/Services Living arrangements for the past 2 months: Single Family Home Lives with:: Spouse Patient language and need for interpreter reviewed:: Yes Do you feel safe going back to the place where you live?: Yes      Need for Family Participation in Patient Care: Yes (Comment) Care giver support system in place?: Yes (comment)   Criminal Activity/Legal Involvement Pertinent to Current Situation/Hospitalization: No - Comment as needed  Activities of Daily Living      Permission Sought/Granted Permission sought to share information with : Facility Medical sales representative, Family Supports Permission granted to share information with : Yes, Verbal Permission Granted  Share Information with NAME:  DERRILL STACIA Blades   [phone]     Permission granted to share info w Relationship: and spouse     Emotional Assessment Appearance:: Appears stated age Attitude/Demeanor/Rapport: Unable to Assess Affect (typically observed): Unable to Assess Orientation: : Oriented to Self Alcohol / Substance Use: Not Applicable Psych Involvement: No (comment)  Admission diagnosis:  Altered mental status [R41.82] Expressive aphasia [R47.01] Altered mental status, unspecified altered mental status type [R41.82] Patient Active Problem List   Diagnosis Date Noted   Acute encephalopathy 08/05/2024   Altered mental status 08/05/2024   Expressive aphasia 08/04/2024   PCP:  Pcp, No Pharmacy:   [facility] [address] [place] [address] Phone: [phone] Fax: [phone]     Social Drivers of Health (SDOH) Social History: SDOH Screenings   Food Insecurity: No Food Insecurity (08/06/2024)  Housing: Low Risk  (08/06/2024)  Transportation Needs: No Transportation Needs (08/06/2024)  Utilities: Not At Risk (08/06/2024)  Social Connections: Unknown (08/06/2024)   SDOH Interventions:     Readmission Risk Interventions     No data to display

## 2024-08-09 NOTE — Plan of Care (Signed)

## 2024-08-09 NOTE — Care Management Important Message (Signed)
 Important Message  Patient Details  Name: Bradley Hansen MRN: [id number] Date of Birth: 11-Jul-1951   Important Message Given:  Yes - Medicare IM     Claretta Deed 08/09/2024, 3:48 PM

## 2024-08-10 ENCOUNTER — Encounter: Payer: Self-pay | Admitting: Internal Medicine

## 2024-08-10 ENCOUNTER — Other Ambulatory Visit (HOSPITAL_COMMUNITY): Payer: Self-pay

## 2024-08-10 DIAGNOSIS — R4701 Aphasia: Secondary | ICD-10-CM | POA: Diagnosis not present

## 2024-08-10 LAB — COMPREHENSIVE METABOLIC PANEL WITH GFR
ALT: 34 U/L (ref 0–44)
AST: 24 U/L (ref 15–41)
Albumin: 3.5 g/dL (ref 3.5–5.0)
Alkaline Phosphatase: 58 U/L (ref 38–126)
Anion gap: 9 (ref 5–15)
BUN: 15 mg/dL (ref 8–23)
CO2: 25 mmol/L (ref 22–32)
Calcium: 9.4 mg/dL (ref 8.9–10.3)
Chloride: 102 mmol/L (ref 98–111)
Creatinine, Ser: 1.09 mg/dL (ref 0.61–1.24)
GFR, Estimated: >60 mL/min (ref >60)
Glucose, Bld: 93 mg/dL (ref 70–99)
Potassium: 4.4 mmol/L (ref 3.5–5.1)
Sodium: 136 mmol/L (ref 135–145)
Total Bilirubin: 1.6 mg/dL — ABNORMAL HIGH (ref 0.0–1.2)
Total Protein: 7.5 g/dL (ref 6.5–8.1)

## 2024-08-10 LAB — CULTURE, BLOOD (ROUTINE X 2): Culture: NO GROWTH

## 2024-08-10 LAB — MAGNESIUM: Magnesium: 2.1 mg/dL (ref 1.7–2.4)

## 2024-08-10 MED ORDER — ROSUVASTATIN CALCIUM 10 MG PO TABS
10.0000 mg | ORAL_TABLET | Freq: Every day | ORAL | 0 refills | Status: AC
  Filled 2024-08-10: qty 30, 30d supply, fill #0

## 2024-08-10 MED ORDER — AMLODIPINE BESYLATE 10 MG PO TABS
10.0000 mg | ORAL_TABLET | Freq: Every day | ORAL | 0 refills | Status: AC
  Filled 2024-08-10: qty 30, 30d supply, fill #0

## 2024-08-10 MED ORDER — ASPIRIN 81 MG PO TBEC
81.0000 mg | DELAYED_RELEASE_TABLET | Freq: Every day | ORAL | 12 refills | Status: AC
  Filled 2024-08-10: qty 30, 30d supply, fill #0

## 2024-08-10 MED ORDER — CARVEDILOL 3.125 MG PO TABS
3.1250 mg | ORAL_TABLET | Freq: Two times a day (BID) | ORAL | 0 refills | Status: AC
  Filled 2024-08-10: qty 60, 30d supply, fill #0

## 2024-08-10 NOTE — Discharge Summary (Signed)
 Bradley Hansen FMW:[id number] DOB: 1951/09/12 DOA: 08/04/2024  PCP: Pcp, No  Admit date: 08/04/2024  Discharge date: 08/10/2024  Admitted From: Home   Disposition:  Home   Recommendations for Outpatient Follow-up:   Follow up with PCP in 1-2 weeks  PCP Please obtain BMP/CBC, 2 view CXR in 1week,  (see Discharge instructions)   PCP Please follow up on the following pending results:    Home Health: PT, RN   Equipment/Devices: walker  Consultations: Neuro Discharge Condition: Stable    CODE STATUS: Full    Diet Recommendation: Heart Healthy     Chief Complaint  Patient presents with   Code Stroke     Brief history of present illness from the day of admission and additional interim summary    73 y.o. male with no reported significant past medical history, per patient's wife, patient has no known significant past medical history, she reports that around noon today, the patient became acutely confused and aphasic, prompting the patient be brought to [facility] emergency department for further evaluation management [facility] did not note any tonic-clonic activity nor any tongue biting or loss of bowel/bladder function.                                                                  Hospital Course   Acute onset of metabolic encephalopathy and expressive aphasia.  No significant past medical history, no focal deficits, neurology on board, MRI head, CTA head and neck and EEG unremarkable,  LP was finally done successfully on 08/06/2024 by IR, CSF preliminary me does not appear to be consistent with meningitis, meningitis panel and respiratory viral panel negative, case was discussed with neurology and ID no signs of meningitis, all antibiotics and antifungals were stopped. Long-term EEG also appears to be unremarkable.   Stable TSH, RPR and B12.  Negative HIV.   Talking to patient's wife this appears to be somewhat of a chronic problem which is gradually progressive question if he has undiagnosed dementia, he is close to his baseline, will be discharged home with home PT and RN with outpatient PCP and neurology follow-up.   Questionable pneumonia on chest x-ray at the time of admission, SLP to evaluate, gentle IV fluids, antibiotics for above will suffice.   Dyslipidemia.  On statin at home will resume once acute conditions stabilize.   Hypertension.  Blood pressure regimen adjusted further, PCP to monitor and adjust.  Gradually improving.   Hypokalemia and hypomagnesemia.  Replaced.     Nonspecific CTA findings in the vertebral arteries.  Secondary prevention per PCP, statin and aspirin  in the outpatient setting when able to.      Discharge diagnosis     Principal Problem:   Expressive aphasia Active Problems:   Acute encephalopathy   Altered mental status  Discharge instructions    Discharge Instructions     Diet - low sodium heart healthy   Complete by: As directed    Discharge instructions   Complete by: As directed    Follow with Primary MD  in 7 days   Get CBC, CMP, Magnesium , 2 view Chest X ray -  checked next visit with your primary MD   Activity: As tolerated with Full fall precautions use walker/cane & assistance as needed  Disposition Home    Diet: Heart Healthy    Special Instructions: If you have smoked or chewed Tobacco  in the last 2 yrs please stop smoking, stop any regular Alcohol  and or any Recreational drug use.  On your next visit with your primary care physician please Get Medicines reviewed and adjusted.  Please request your Prim.MD to go over all Hospital Tests and Procedure/Radiological results at the follow up, please get all Hospital records sent to your Prim MD by signing hospital release before you go home.  If you experience worsening of your  admission symptoms, develop shortness of breath, life threatening emergency, suicidal or homicidal thoughts you must seek medical attention immediately by calling 911 or calling your MD immediately  if symptoms less severe.  You Must read complete instructions/literature along with all the possible adverse reactions/side effects for all the Medicines you take and that have been prescribed to you. Take any new Medicines after you have completely understood and accpet all the possible adverse reactions/side effects.   Do not drive when taking Pain medications.  Do not take more than prescribed Pain, Sleep and Anxiety Medications  Wear Seat belts while driving.   Increase activity slowly   Complete by: As directed        Discharge Medications   Allergies as of 08/10/2024   No Known Allergies      Medication List     STOP taking these medications    Mavyret  100-40 MG Tabs Generic drug: Glecaprevir -Pibrentasvir        TAKE these medications    amLODipine  10 MG tablet Commonly known as: NORVASC  Take 1 tablet (10 mg total) by mouth daily.   aspirin  EC 81 MG tablet Take 1 tablet (81 mg total) by mouth daily. Swallow whole.   carvedilol  3.125 MG tablet Commonly known as: COREG  Take 1 tablet (3.125 mg total) by mouth 2 (two) times daily with a meal.   rosuvastatin  10 MG tablet Commonly known as: CRESTOR  Take 1 tablet (10 mg total) by mouth at bedtime.               Durable Medical Equipment  (From admission, onward)           Start     Ordered   08/10/24 0747  For home use only DME Walker rolling  Once       Comments: 5 wheel  Question Answer Comment  Walker: With 5 Inch Wheels   Patient needs a walker to treat with the following condition Weakness      08/10/24 0746             Follow-up Information     [facility]. Schedule an appointment as soon as possible for a visit in 1 week(s).   Contact information: [address] [place]  [address] [phone]        [facility]. Schedule an appointment as soon as possible for a visit in 1 week(s).  Contact information: [address]     [address] [place]   [address] [phone]                Major procedures and Radiology Reports - PLEASE review detailed and final reports thoroughly  -      DG Fluoro Rm 1-60 Min - No Report Result Date: 08/08/2024 CLINICAL DATA:  73 year old male with acute encephalopathy. IR was requested for lumbar puncture. EXAM: LUMBAR PUNCTURE UNDER FLUOROSCOPY PROCEDURE: An appropriate skin entry site was determined fluoroscopically. Operator donned sterile gloves and mask. Skin site was marked, then prepped with Betadine, draped in usual sterile fashion, and infiltrated locally with 1% lidocaine . A 20 gauge spinal needle advanced into the thecal sac at L5-S1 from a right interlaminar approach. Clear colorless CSF spontaneously returned, with opening pressure of 23 cm water. 12 ml CSF were collected and divided among 4 sterile vials for the requested laboratory studies. The needle was then removed. The patient tolerated the procedure well and there were no complications. FLUOROSCOPY: Radiation Exposure Index (as provided by the fluoroscopic device): 19.3 mGy Kerma Fluoroscopic image was not saved for this procedure. IMPRESSION: Technically successful lumbar puncture under fluoroscopy. This exam was performed by Carlin Griffon, PA-C, and was supervised and interpreted by Dr. Juliene Balder, MD. Electronically Signed   By: Juliene Balder M.D.   On: 08/08/2024 08:43   ECHOCARDIOGRAM COMPLETE Result Date: 08/07/2024    ECHOCARDIOGRAM REPORT   Patient Name:   Bradley Hansen Date of Exam: 08/07/2024 Medical Rec #:  [id number]    Height: Accession #:    [id number]   Weight:       167.5 lb Date of Birth:  03-25-1951    BSA:          1.794 m Patient Age:    73 years     BP:            137/81 mmHg Patient Gender: M            HR:           98 bpm. Exam Location:  Inpatient Procedure: 2D Echo, Cardiac Doppler and Color Doppler (Both Spectral and Color            Flow Doppler were utilized during procedure). Indications:    Stroke  History:        Patient has no prior history of Echocardiogram examinations.                 Signs/Symptoms:Altered Mental Status.  Sonographer:    Ellouise Mose RDCS Referring Phys: [id number] [facility]  Sonographer Comments: No subcostal window. Image acquisition challenging due to uncooperative patient. Patient had been unable to stay still for exam on prevous day and was grabbing at tech. Attempted exam today and was interrupted by RN to give BP patch. Patient was startled and began grabbing at probe and pulled probe off chest. IMPRESSIONS  1. Technically difficult study -see sonographer's comments. No subcostal or suparsternal notch images.  2. Left ventricular ejection fraction, by estimation, is 55 to 60%. The left ventricle has normal function. The left ventricle has no regional wall motion abnormalities. There is moderate left ventricular hypertrophy. Indeterminate diastolic filling due  to E-A fusion.  3. Right ventricular systolic function is normal. The right ventricular size is normal. Tricuspid regurgitation signal is inadequate for assessing PA pressure.  4. The mitral valve is degenerative. Trivial mitral valve regurgitation. Trivial to mild mitral stenosis cannot be ruled out (  MG 7.75mmHG, HR 104bpm) mitral stenosis. Severe mitral annular calcification.  5. The aortic valve is tricuspid. Aortic valve regurgitation is not visualized. Aortic valve sclerosis is present, with no evidence of aortic valve stenosis.  6. Aortic dilatation noted. There is dilatation of the ascending aorta, measuring 39 mm. Comparison(s): No prior Echocardiogram. Conclusion(s)/Recommendation(s): No intracardiac source of embolism detected on this transthoracic study. Consider a  transesophageal echocardiogram to exclude cardiac source of embolism if clinically indicated. FINDINGS  Left Ventricle: Left ventricular ejection fraction, by estimation, is 55 to 60%. The left ventricle has normal function. The left ventricle has no regional wall motion abnormalities. The left ventricular internal cavity size was normal in size. There is  moderate left ventricular hypertrophy. Indeterminate diastolic filling due to E-A fusion. Right Ventricle: The right ventricular size is normal. Right vetricular wall thickness was not assessed. Right ventricular systolic function is normal. Tricuspid regurgitation signal is inadequate for assessing PA pressure. Left Atrium: Left atrial size was normal in size. Right Atrium: Right atrial size was normal in size. Pericardium: There is no evidence of pericardial effusion. Mitral Valve: The mitral valve is degenerative in appearance. There is moderate thickening of the mitral valve leaflet(s). There is mild calcification of the mitral valve leaflet(s). Mildly decreased mobility of the mitral valve leaflets. Severe mitral annular calcification. Trivial mitral valve regurgitation. Trivial to mild mitral stenosis cannot be ruled out (MG 7.38mmHG, HR 104bpm) mitral valve stenosis. MV peak gradient, 17.6 mmHg. The mean mitral valve gradient is 7.5 mmHg with average heart rate of 104 bpm. Tricuspid Valve: The tricuspid valve is grossly normal. Tricuspid valve regurgitation is not demonstrated. No evidence of tricuspid stenosis. Aortic Valve: The aortic valve is tricuspid. Aortic valve regurgitation is not visualized. Aortic valve sclerosis is present, with no evidence of aortic valve stenosis. Pulmonic Valve: The pulmonic valve was grossly normal. Pulmonic valve regurgitation is not visualized. No evidence of pulmonic stenosis. Aorta: The aortic root is normal in size and structure and aortic dilatation noted. There is dilatation of the ascending aorta, measuring 39 mm.  Venous: The inferior vena cava was not well visualized. IAS/Shunts: The interatrial septum was not assessed.  LEFT VENTRICLE PLAX 2D LVIDd:         4.20 cm     Diastology LVIDs:         2.90 cm     LV e' medial:    8.81 cm/s LV PW:         1.40 cm     LV E/e' medial:  14.3 LV IVS:        1.70 cm     LV e' lateral:   7.83 cm/s LVOT diam:     2.50 cm     LV E/e' lateral: 16.1 LV SV:         81 LV SV Index:   45 LVOT Area:     4.91 cm  LV Volumes (MOD) LV vol d, MOD A4C: 79.2 ml LV vol s, MOD A4C: 37.6 ml LV SV MOD A4C:     79.2 ml LEFT ATRIUM           Index LA diam:      3.30 cm 1.84 cm/m LA Vol (A4C): 20.1 ml 11.20 ml/m  AORTIC VALVE LVOT Vmax:   108.00 cm/s LVOT Vmean:  72.500 cm/s LVOT VTI:    0.165 m  AORTA Ao Root diam: 3.80 cm Ao Asc diam:  3.90 cm MITRAL VALVE MV Area (PHT): 5.14 cm  SHUNTS MV Area VTI:   2.59 cm     Systemic VTI:  0.16 m MV Peak grad:  17.6 mmHg    Systemic Diam: 2.50 cm MV Mean grad:  7.5 mmHg MV Vmax:       2.09 m/s MV Vmean:      124.5 cm/s MV Decel Time: 148 msec MV E velocity: 126.00 cm/s MV A velocity: 199.00 cm/s MV E/A ratio:  0.63 Sunit Tolia Electronically signed by Madonna Large Signature Date/Time: 08/07/2024/11:20:43 AM    Final    Overnight EEG with video Result Date: 08/07/2024 Shelton Arlin KIDD, MD     08/07/2024  9:45 AM Patient Name: Bradley Hansen MRN: [id number] Epilepsy Attending: Arlin KIDD Shelton Referring Physician/Provider: Voncile Isles, MD Duration: 08/06/2024 1901 to 08/07/2024 0944  Patient history:  73 y.o. M who presents with right-sided weakness and difficulty speaking. EEG to evaluate for seizure  Level of alertness: awake  AEDs during EEG study: None  Technical aspects: This EEG study was done with scalp electrodes positioned according to the 10-20 International system of electrode placement. Electrical activity was reviewed with band pass filter of 1-70Hz , sensitivity of 7 uV/mm, display speed of 77mm/sec with a 60Hz  notched filter applied as appropriate.  EEG data were recorded continuously and digitally stored.  Video monitoring was available and reviewed as appropriate.  Description: The posterior dominant rhythm consists of 9 Hz activity of moderate voltage (25-35 uV) seen predominantly in posterior head regions, symmetric and reactive to eye opening and eye closing. EEG showed intermittent generalized 3 to 6 Hz theta-delta slowing. Hyperventilation and photic stimulation were not performed.    ABNORMALITY - Intermittent slow, generalized  IMPRESSION: This study is suggestive of mild diffuse encephalopathy. No seizures or definite epileptiform discharges were seen throughout the recording.  Arlin KIDD Shelton    DG Chest Port 1 View Result Date: 08/06/2024 EXAM: 1 VIEW XRAY OF THE CHEST 08/06/2024 07:22:00 AM COMPARISON: 08/05/2024 CLINICAL HISTORY: [id number] SOB (shortness of breath). Reason for exam: pt order states SOB (shortness of breath); Best obtainable images due to pt condition. FINDINGS: LUNGS AND PLEURA: Minimal airspace opacities at the lung bases are stable, likely atelectasis. No pleural effusion. No pneumothorax. HEART AND MEDIASTINUM: Mild cardiomegaly is stable. Atherosclerotic changes are present at the aortic arch. BONES AND SOFT TISSUES: No acute osseous abnormality. IMPRESSION: 1. Stable mild cardiomegaly, low lung volumes, and minimal airspace opacities at the lung bases, likely atelectasis. 2. Atherosclerotic changes at the aortic arch. Electronically signed by: Lonni Necessary MD 08/06/2024 07:28 AM EDT RP Workstation: [id number]   DG FL GUIDED LUMBAR PUNCTURE Result Date: 08/05/2024 CLINICAL DATA:  73 year old male with altered mental status, being evaluated for possible meningitis or normal pressure hydrocephalus. IR was requested for LP, with 30 cc CSF collected. EXAM: LUMBAR PUNCTURE UNDER FLUOROSCOPY PROCEDURE: An appropriate skin entry site was determined fluoroscopically. Operator donned sterile gloves and mask. Skin site was  marked, then prepped with Betadine, draped in usual sterile fashion, and infiltrated locally with 1% lidocaine . A 20 gauge spinal needle advanced into the thecal sac at L5-S1, L4-L5, L3-L4 from a left interlaminar approach. Unfortunately, patient remained agitated throughout the procedure, constantly moving. No CSF returned at all 3 levels attempted. Therefore, procedure was aborted. The needle was then removed. FLUOROSCOPY: Radiation Exposure Index (as provided by the fluoroscopic device): 18.9 mGy Kerma IMPRESSION: Unsuccessful lumbar puncture under fluoroscopy. This exam was performed by Carlin Griffon, PA-C, and was supervised and interpreted by Dr. Juliene Balder, MD.  Electronically Signed   By: Juliene Balder M.D.   On: 08/05/2024 20:20   DG CHEST PORT 1 VIEW Result Date: 08/05/2024 CLINICAL DATA:  Fever. EXAM: PORTABLE CHEST 1 VIEW COMPARISON:  08/05/2024 and CT chest 07/15/2023. FINDINGS: Trachea is midline. Heart is enlarged, stable. Mild left lobe interstitial prominence. No pleural fluid. Elevated right hemidiaphragm. IMPRESSION: Left lower lobe interstitial prominence may be due to a viral pneumonia or infectious bronchiolitis. Electronically Signed   By: Newell Eke M.D.   On: 08/05/2024 12:37   MR BRAIN WO CONTRAST Result Date: 08/05/2024 EXAM: MRI BRAIN WITHOUT CONTRAST 08/05/2024 08:03:00 AM TECHNIQUE: Multiplanar multisequence MRI of the head/brain was attempted without the administration of intravenous contrast. The patient was unable to tolerate the MRI scan despite sedation. Only axial diffusion weighted images were obtained. COMPARISON: None available. CLINICAL HISTORY: Mental status change, unknown cause. Pt was uncooperative. Second time trying per neuro request. Pt received ativan  the first time and more the second and still not a candidate for MRI. DWI sent and pt went back to room. FINDINGS: BRAIN AND VENTRICLES: No acute or subacute infarction demonstrated on diffusion-weighted images. No  focal susceptibility is present on these axial weighted images. IMPRESSION: 1. Limited study due to patient's inability to tolerate the exam. 2. No acute or subacute infarction on axial diffusion-weighted images. Electronically signed by: Lonni Necessary MD 08/05/2024 08:17 AM EDT RP Workstation: [id number]   EEG adult Result Date: 08/05/2024 Shelton Arlin KIDD, MD     08/05/2024  6:29 AM Patient Name: Bradley Hansen MRN: [id number] Epilepsy Attending: Arlin KIDD Shelton Referring Physician/Provider: Michaela Aisha SQUIBB, MD Date: 08/05/2024 Duration: 30.35 mins Patient history:  73 y.o. M who presents with right-sided weakness and difficulty speaking. EEG to evaluate for seizure Level of alertness: comatose/ lethargic AEDs during EEG study: None Technical aspects: This EEG study was done with scalp electrodes positioned according to the 10-20 International system of electrode placement. Electrical activity was reviewed with band pass filter of 1-70Hz , sensitivity of 7 uV/mm, display speed of 81mm/sec with a 60Hz  notched filter applied as appropriate. EEG data were recorded continuously and digitally stored.  Video monitoring was available and reviewed as appropriate. Description: EEG showed continuous generalized 3 to 6 Hz theta-delta slowing, at times with triphasic morphology. Hyperventilation and photic stimulation were not performed.   ABNORMALITY - Continuous slow, generalized IMPRESSION: This study is suggestive of moderate diffuse encephalopathy. No seizures or definite epileptiform discharges were seen throughout the recording. Arlin KIDD Shelton   DG Chest Port 1 View Result Date: 08/05/2024 CLINICAL DATA:  Shortness of breath EXAM: PORTABLE CHEST 1 VIEW COMPARISON:  07/15/2023 FINDINGS: Heart is mildly enlarged. Postsurgical changes are again seen. Lungs are well aerated without focal infiltrate or sizable effusion. No bony abnormality is seen. IMPRESSION: No acute abnormality noted. Electronically Signed    By: Oneil Devonshire M.D.   On: 08/05/2024 01:47   CT ANGIO HEAD NECK W WO CM W PERF (CODE STROKE) Result Date: 08/04/2024 CLINICAL DATA:  Initial evaluation for acute neuro deficit, stroke. EXAM: CT ANGIOGRAPHY HEAD AND NECK CT PERFUSION BRAIN TECHNIQUE: Multidetector CT imaging of the head and neck was performed using the standard protocol during bolus administration of intravenous contrast. Multiplanar CT image reconstructions and MIPs were obtained to evaluate the vascular anatomy. Carotid stenosis measurements (when applicable) are obtained utilizing NASCET criteria, using the distal internal carotid diameter as the denominator. Multiphase CT imaging of the brain was performed following IV bolus contrast injection. Subsequent  parametric perfusion maps were calculated using RAPID software. RADIATION DOSE REDUCTION: This exam was performed according to the departmental dose-optimization program which includes automated exposure control, adjustment of the mA and/or kV according to patient size and/or use of iterative reconstruction technique. CONTRAST:  OMNIPAQUE  IOHEXOL  350 MG/ML SOLN COMPARISON:  Head CT from earlier the same day. FINDINGS: CTA NECK FINDINGS Aortic arch: Visualized arch which within normal limits for caliber with standard branch pattern. Aortic atherosclerosis. No significant stenosis about the origin the great vessels. Right carotid system: Right common and internal carotid arteries are patent without dissection. Mild eccentric plaque about the right carotid bulb without hemodynamically significant stenosis. Left carotid system: Left common and internal carotid arteries are patent without dissection. Mild atheromatous change about the left carotid bulb without hemodynamically significant graded 50% stenosis. Vertebral arteries: Both vertebral arteries arise from subclavian arteries. Atheromatous plaque at the origins of both vertebral arteries with severe ostial stenoses. Vertebral arteries  patent distally without stenosis or dissection. Skeleton: No worrisome osseous lesions. Mild-to-moderate spondylosis at C5-6 and C6-7. Other neck: No other acute finding. Upper chest: No other acute finding. Review of the MIP images confirms the above findings CTA HEAD FINDINGS Anterior circulation: Both internal carotid arteries are patent to the termini without significant stenosis. A1 segments patent bilaterally. Normal anterior communicating artery complex. Anterior cerebral arteries patent without significant stenosis. No M1 stenosis or occlusion. Distal MCA branches perfused and symmetric. Posterior circulation: Both V4 segments patent without stenosis. Both PICA patent. Basilar patent without stenosis. Superior cerebellar arteries patent bilaterally. Right PCA supplied via the basilar as well as a small right posterior communicating artery. Fetal type origin left PCA. Both PCAs patent without significant stenosis. Venous sinuses: Grossly patent allowing for timing the contrast bolus. Anatomic variants: As above.  No aneurysm. Review of the MIP images confirms the above findings CT Brain Perfusion Findings: ASPECTS: 10 CBF (<30%) Volume: 0mL Perfusion (Tmax>6.0s) volume: 10mL Mismatch Volume: 10mL Infarction Location:Negative CT perfusion for acute core infarct. Apparent 10 mL area of delayed perfusion at the right parieto-occipital region, suspected be related to the fetal type left PCA. No other convincing perfusion abnormality. IMPRESSION: 1. Negative CTA for large vessel occlusion or other emergent finding. 2. Negative CT perfusion for acute ischemia or other perfusion abnormality. 3. Atheromatous plaque at the origins of both vertebral arteries with severe ostial stenoses. 4. Mild atheromatous change about the carotid bifurcations without hemodynamically significant stenosis. 5.  Aortic Atherosclerosis (ICD10-I70.0). These results were communicated to Dr. Michaela at 9:17 pm on 08/04/2024 by text page  via the [facility] messaging system. Electronically Signed   By: Morene Hoard M.D.   On: 08/04/2024 21:18   CT HEAD CODE STROKE WO CONTRAST Result Date: 08/04/2024 CLINICAL DATA:  Code stroke. A shin initial evaluation for acute neuro deficit, stroke. EXAM: CT HEAD WITHOUT CONTRAST TECHNIQUE: Contiguous axial images were obtained from the base of the skull through the vertex without intravenous contrast. RADIATION DOSE REDUCTION: This exam was performed according to the departmental dose-optimization program which includes automated exposure control, adjustment of the mA and/or kV according to patient size and/or use of iterative reconstruction technique. COMPARISON:  None Available. FINDINGS: Brain: Cerebral volume within normal limits. Patchy hypodensity involving the supratentorial cerebral white matter, most characteristic of chronic small vessel ischemic disease, moderate to advanced in nature. No acute intracranial hemorrhage. No visible acute large vessel territory infarct. No mass lesion or midline shift. Mild ventricular prominence without hydrocephalus. No extra-axial fluid collection. Vascular:  There is question of an asymmetric hyperdensity involving the distal left M1 segment/left MCA bifurcation (series 6, image 45). Skull: Scalp soft tissues within normal limits.  Calvarium intact. Sinuses/Orbits: Globes and orbital soft tissues demonstrate no acute finding. Mild chronic mucosal thickening present about the ethmoidal air cells and maxillary sinuses. Paranasal sinuses are otherwise largely clear. No significant mastoid effusion. Other: None. ASPECTS [facility] Stroke Program Early CT Score) - Ganglionic level infarction (caudate, lentiform nuclei, internal capsule, insula, M1-M3 cortex): 7 - Supraganglionic infarction (M4-M6 cortex): 3 Total score (0-10 with 10 being normal): 10 IMPRESSION: 1. Question asymmetric hyperdensity involving the distal left M1 segment/left MCA bifurcation, which could  reflect thrombus. Correlation with dedicated CTA recommended. No acute intracranial hemorrhage or visible acute large vessel territory infarct. 2. Aspects is 10. 3. Moderate to advanced chronic microvascular ischemic disease. These results were communicated to Dr. Michaela at 8:57 pm on 08/04/2024 by text page via the [facility] messaging system. Electronically Signed   By: Morene Hoard M.D.   On: 08/04/2024 21:00    Micro Results    Recent Results (from the past 240 hours)  Culture, blood (Routine X 2) w Reflex to ID Panel     Status: None   Collection Time: 08/05/24 11:10 AM   Specimen: BLOOD LEFT ARM  Result Value Ref Range Status   Specimen Description BLOOD LEFT ARM  Final   Special Requests   Final    BOTTLES DRAWN AEROBIC AND ANAEROBIC Blood Culture results may not be optimal due to an inadequate volume of blood received in culture bottles   Culture   Final    NO GROWTH 5 DAYS Performed at [facility], [address], [place], [place] [address]    Report Status 08/10/2024 FINAL  Final  CSF culture w Gram Stain     Status: None   Collection Time: 08/05/24 12:28 PM   Specimen: PATH Cytology CSF; Cerebrospinal Fluid  Result Value Ref Range Status   Specimen Description CSF  Final   Special Requests NONE  Final   Gram Stain NO WBC SEEN NO ORGANISMS SEEN CYTOSPIN SMEAR   Final   Culture   Final    NO GROWTH 3 DAYS Performed at [facility], [address], [place], [place] [address]    Report Status 08/09/2024 FINAL  Final  MRSA Next Gen by PCR, Nasal     Status: None   Collection Time: 08/05/24  9:11 PM   Specimen: Nasal Mucosa; Nasal Swab  Result Value Ref Range Status   MRSA by PCR Next Gen NOT DETECTED NOT DETECTED Final    Comment: (NOTE) The GeneXpert MRSA Assay (FDA approved for NASAL specimens only), is one component of a comprehensive MRSA colonization surveillance program. It is not intended to diagnose MRSA infection nor to guide or monitor  treatment for MRSA infections. Test performance is not FDA approved in patients less than 59 years old. Performed at [facility], [address], [place], [place] [address]   Culture, blood (Routine X 2) w Reflex to ID Panel     Status: None (Preliminary result)   Collection Time: 08/06/24  7:15 AM   Specimen: BLOOD LEFT HAND  Result Value Ref Range Status   Specimen Description BLOOD LEFT HAND  Final   Special Requests   Final    BOTTLES DRAWN AEROBIC ONLY Blood Culture results may not be optimal due to an inadequate volume of blood received in culture bottles  Culture   Final    NO GROWTH 4 DAYS Performed at [facility], [address], [place], [place] [address]    Report Status PENDING  Incomplete  Respiratory (~20 pathogens) panel by PCR     Status: None   Collection Time: 08/06/24  7:51 AM   Specimen: Nasopharyngeal Swab; Respiratory  Result Value Ref Range Status   Adenovirus NOT DETECTED NOT DETECTED Final   Coronavirus 229E NOT DETECTED NOT DETECTED Final    Comment: (NOTE) The Coronavirus on the Respiratory Panel, DOES NOT test for the novel  Coronavirus (2019 nCoV)    Coronavirus HKU1 NOT DETECTED NOT DETECTED Final   Coronavirus NL63 NOT DETECTED NOT DETECTED Final   Coronavirus OC43 NOT DETECTED NOT DETECTED Final   Metapneumovirus NOT DETECTED NOT DETECTED Final   Rhinovirus / Enterovirus NOT DETECTED NOT DETECTED Final   Influenza A NOT DETECTED NOT DETECTED Final   Influenza B NOT DETECTED NOT DETECTED Final   Parainfluenza Virus 1 NOT DETECTED NOT DETECTED Final   Parainfluenza Virus 2 NOT DETECTED NOT DETECTED Final   Parainfluenza Virus 3 NOT DETECTED NOT DETECTED Final   Parainfluenza Virus 4 NOT DETECTED NOT DETECTED Final   Respiratory Syncytial Virus NOT DETECTED NOT DETECTED Final   Bordetella pertussis NOT DETECTED NOT DETECTED Final   Bordetella Parapertussis NOT DETECTED NOT DETECTED Final   Chlamydophila pneumoniae NOT DETECTED NOT  DETECTED Final   Mycoplasma pneumoniae NOT DETECTED NOT DETECTED Final    Comment: Performed at [facility], [address] [place], [place] [address]    Today   Subjective    Bradley Hansen today has no headache,no chest abdominal pain,no new weakness tingling or numbness, feels much better wants to go home today.    Objective   Blood pressure (!) 149/91, pulse 93, temperature (!) 97.5 F (36.4 C), temperature source Axillary, resp. rate (!) 22, weight 74.7 kg, SpO2 96%.   Intake/Output Summary (Last 24 hours) at 08/10/2024 0748 Last data filed at 08/09/2024 2314 Gross per 24 hour  Intake --  Output 550 ml  Net -550 ml    Exam  Awake, oriented x 2, minimally confused, No new F.N deficits,    [place].AT,PERRAL Supple Neck,   Symmetrical Chest wall movement, Good air movement bilaterally, CTAB RRR,No Gallops,   +ve B.Sounds, Abd Soft, Non tender,  No Cyanosis, Clubbing or edema    Data Review   Recent Labs  Lab 08/05/24 0613 08/05/24 0913 08/05/24 0915 08/06/24 0715 08/07/24 0558 08/08/24 0515 08/09/24 0429  WBC 8.4  --   --  6.1 6.2 5.6 4.3  HGB 13.9   < > 13.6 13.5 13.0 13.3 13.1  HCT 40.6   < > 40.0 39.8 37.4* 39.5 38.2*  PLT 222  --   --  214 236 246 248  MCV 89.4  --   --  88.6 88.2 89.2 88.6  MCH 30.6  --   --  30.1 30.7 30.0 30.4  MCHC 34.2  --   --  33.9 34.8 33.7 34.3  RDW 11.1*  --   --  11.3* 11.0* 11.1* 11.1*  LYMPHSABS 1.1  --   --  1.3 1.0 1.2 1.3  MONOABS 0.4  --   --  0.8 0.7 0.5 0.5  EOSABS 0.0  --   --  0.0 0.0 0.1 0.1  BASOSABS 0.0  --   --  0.0 0.0 0.1 0.1   < > = values in this interval  not displayed.    Recent Labs  Lab 08/04/24 2042 08/04/24 2045 08/05/24 9386 08/05/24 9348 08/05/24 0746 08/05/24 0913 08/05/24 0915 08/06/24 0715 08/07/24 0558 08/08/24 0515 08/09/24 0429  NA 134*   < > 133*  --   --    < > 134* 136 134* 134* 134*  K 3.8   < > 3.8  --   --    < > 3.5 3.2* 3.3* 3.5 3.4*  CL 95*   < > 98  --   --   --   --   99 99 100 102  CO2 22  --  22  --   --   --   --  23 21* 23 24  ANIONGAP 17*  --  13  --   --   --   --  14 14 11 8   GLUCOSE 140*   < > 130*  --   --   --   --  110* 111* 100* 101*  BUN 6*   < > 7*  --   --   --   --  8 <5* 6* 9  CREATININE 1.02   < > 1.00  --   --   --   --  0.93 0.87 0.85 0.87  AST 35  --  39  --   --   --   --  48* 40 36 27  ALT 38  --  47*  --   --   --   --  63* 54* 48* 37  ALKPHOS 78  --  72  --   --   --   --  67 60 58 53  BILITOT 1.1  --  1.2  --   --   --   --  1.3* 1.3* 1.6* 1.2  ALBUMIN  4.2  --  4.0  --   --   --   --  3.6 3.6 3.4* 3.2*  CRP  --   --   --   --   --   --   --  1.4* 1.7* 0.9 0.6  PROCALCITON  --   --   --   --   --   --   --  <0.10 <0.10 <0.10 <0.10  INR 1.0  --   --   --   --   --   --   --   --   --   --   TSH  --   --   --  1.023  --   --   --   --   --   --   --   HGBA1C  --   --   --   --  4.9  --   --   --   --   --   --   AMMONIA  --   --   --  18  --   --   --  20  --   --   --   MG 1.7  --  1.8  --   --   --   --   --  1.6* 2.3 2.3  CALCIUM  9.8  --  9.9  --   --   --   --  9.5 9.1 9.2 9.1   < > = values in this interval not displayed.    Total Time in preparing paper work, data evaluation and todays exam - 35 minutes  Signature  -  Lavada Stank M.D on 08/10/2024 at 7:48 AM   -  To page go to [email or web address]

## 2024-08-10 NOTE — Progress Notes (Addendum)
 AVS completed and patient and wife.  Wife and patient refused interpreter for discharge.  States, I can understand you.  Interpreter offered.  Tele called. TOC ready for discharge.  PIV removed DME to be delivered to home per CW.

## 2024-08-10 NOTE — Progress Notes (Signed)
 Pt NIHSS 8, pt oriented to person and place, disoriented to time and situation.  Wife at bedside, pt follows commands, pt plan to discharge to home tomorrow.  C/o mild HA 4/10 and given Tylenol  and had relief, pt able to sleep.  Pt awake at 5 am trying to get up from bed, confused and redirected and reoriented, pt [place] was dislodged and the bed was wet with urine, pt cleaned up and a new gown placed, pt encouraged to stay in bed and not attempt to get up.  Will monitor.

## 2024-08-10 NOTE — Discharge Instructions (Signed)
 Follow with Primary MD  in 7 days   Get CBC, CMP, Magnesium , 2 view Chest X ray -  checked next visit with your primary MD   Activity: As tolerated with Full fall precautions use walker/cane & assistance as needed  Disposition Home    Diet: Heart Healthy    Special Instructions: If you have smoked or chewed Tobacco  in the last 2 yrs please stop smoking, stop any regular Alcohol  and or any Recreational drug use.  On your next visit with your primary care physician please Get Medicines reviewed and adjusted.  Please request your Prim.MD to go over all Hospital Tests and Procedure/Radiological results at the follow up, please get all Hospital records sent to your Prim MD by signing hospital release before you go home.  If you experience worsening of your admission symptoms, develop shortness of breath, life threatening emergency, suicidal or homicidal thoughts you must seek medical attention immediately by calling 911 or calling your MD immediately  if symptoms less severe.  You Must read complete instructions/literature along with all the possible adverse reactions/side effects for all the Medicines you take and that have been prescribed to you. Take any new Medicines after you have completely understood and accpet all the possible adverse reactions/side effects.   Do not drive when taking Pain medications.  Do not take more than prescribed Pain, Sleep and Anxiety Medications  Wear Seat belts while driving.

## 2024-08-11 LAB — CULTURE, BLOOD (ROUTINE X 2): Culture: NO GROWTH

## 2024-08-24 ENCOUNTER — Ambulatory Visit: Admitting: Pharmacist

## 2024-09-08 NOTE — Progress Notes (Signed)
 HPI: Bradley Hansen is a 73 y.o. male who presents to the [facility] pharmacy clinic for Hepatitis C follow-up.  Medication: Mavyret  x 8 weeks  Start Date: 06/19/2024  Hepatitis C Genotype: 2  Fibrosis Score: F3  Hepatitis C RNA: 98,300 (03/03/2023)  Patient Active Problem List   Diagnosis Date Noted   Acute encephalopathy 08/05/2024   Altered mental status 08/05/2024   Expressive aphasia 08/04/2024   Essential hypertension 09/16/2023   Educated about COVID-19 virus infection 09/08/2020   Nonischemic cardiomyopathy (HCC) 09/08/2020   Atrial flutter, post operative 06/04/2014   S/P minimally invasive mitral valve repair 04/26/2014   Mitral valve prolapse 03/26/2014   Mitral regurgitation    ATHEROSCLEROSIS OF AORTA 12/01/2010   Backache 12/11/2009   Mitral valve disease 02/24/2007   GASTROESOPHAGEAL REFLUX, NO ESOPHAGITIS 02/24/2007    Patient's Medications  New Prescriptions   No medications on file  Previous Medications   AMLODIPINE  (NORVASC ) 10 MG TABLET    Take 1 tablet (10 mg total) by mouth daily.   ASPIRIN  EC 81 MG TABLET    Take 81 mg by mouth once.   ASPIRIN  EC 81 MG TABLET    Take 1 tablet (81 mg total) by mouth daily. Swallow whole.   CARVEDILOL  (COREG ) 3.125 MG TABLET    Take 1 tablet (3.125 mg total) by mouth 2 (two) times daily with a meal.   GLECAPREVIR -PIBRENTASVIR  (MAVYRET ) 100-40 MG TABS    Take 3 tablets by mouth daily with breakfast.   LISINOPRIL (ZESTRIL) 2.5 MG TABLET    Take 2.5 mg by mouth daily.   MECLIZINE (ANTIVERT) 12.5 MG TABLET    Take 12.5 mg by mouth 3 (three) times daily.   ROSUVASTATIN  (CRESTOR ) 10 MG TABLET    Take 1 tablet (10 mg total) by mouth daily.   ROSUVASTATIN  (CRESTOR ) 10 MG TABLET    Take 1 tablet (10 mg total) by mouth at bedtime.   TIZANIDINE (ZANAFLEX) 4 MG TABLET    Take 4 mg by mouth 3 (three) times daily.  Modified Medications   No medications on file  Discontinued Medications   No medications on file    Allergies: No Known  Allergies  Past Medical History: Past Medical History:  Diagnosis Date   Atrial flutter, post operative 06/04/2014   BACK PAIN, CHRONIC 12/11/2009   Qualifier: Diagnosis of  By: Sharlet MD, James     Fibromyalgia    GASTROESOPHAGEAL REFLUX, NO ESOPHAGITIS 02/24/2007   Qualifier: Diagnosis of  By: Sharron Railing     HEPATITIS C 02/24/2007   Qualifier: Diagnosis of  By: Sharron Railing     Hx of echocardiogram    Echo (04/2014):  EF 45-50%, diff HK, mild AI, MV repair ok (mean 5 mmHg), mod LAE, mild RAE, mild late TV systolic prolapse   Hypercholesteremia    Leukopenia    Mitral regurgitation    Mitral valve prolapse    TEE  08/2010 severe MR, bileaflet MVP, EF 60%   Prostadynia    Prostatitis    Rhinitis 2011   chronic    S/P minimally invasive mitral valve repair 04/26/2014   Complex valvuloplasty including artificial Gore-tex neocord placement x8, bovine pericardial patch augmentation of P3 portion of posterior leaflet and 36 mm Sorin Memo 3D ring annuloplasty via right mini thoracotomy   Synovitis of hand 2011   right hand    Social History: Social History   Socioeconomic History   Marital status: Married    Spouse name: Not  on file   Number of children: Not on file   Years of education: Not on file   Highest education level: Not on file  Occupational History   Not on file  Tobacco Use   Smoking status: Never   Smokeless tobacco: Never  Substance and Sexual Activity   Alcohol use: Not Currently    Comment: week   Drug use: Never   Sexual activity: Not on file  Other Topics Concern   Not on file  Social History Narrative   ** Merged History Encounter **       Social Drivers of Health   Financial Resource Strain: Not on file  Food Insecurity: No Food Insecurity (08/06/2024)   Hunger Vital Sign    Worried About Running Out of Food in the Last Year: Never true    Ran Out of Food in the Last Year: Never true  Transportation Needs: No Transportation Needs  (08/06/2024)   PRAPARE - Administrator, Civil Service (Medical): No    Lack of Transportation (Non-Medical): No  Physical Activity: Not on file  Stress: Not on file  Social Connections: Unknown (08/06/2024)   Social Connection and Isolation Panel    Frequency of Communication with Friends and Family: More than three times a week    Frequency of Social Gatherings with Friends and Family: More than three times a week    Attends Religious Services: Not on file    Active Member of Clubs or Organizations: Yes    Attends Banker Meetings: 1 to 4 times per year    Marital Status: Married    Labs: Hepatitis C Lab Results  Component Value Date   HCVGENOTYPE 2 02/07/2024   HCVRNAPCRQN <15 NOT DETECTED 07/19/2024   Hepatitis B Lab Results  Component Value Date   HEPBSAG NON-REACTIVE 02/07/2024   HEPBCAB REACTIVE (A) 02/07/2024   Hepatitis A No results found for: HAV HIV Lab Results  Component Value Date   HIV Non Reactive 08/08/2024   Lab Results  Component Value Date   CREATININE 1.09 08/10/2024   CREATININE 0.87 08/09/2024   CREATININE 0.85 08/08/2024   CREATININE 0.87 08/07/2024   CREATININE 0.93 08/06/2024   Lab Results  Component Value Date   AST 24 08/10/2024   AST 27 08/09/2024   AST 36 08/08/2024   ALT 34 08/10/2024   ALT 37 08/09/2024   ALT 48 (H) 08/08/2024   INR 1.0 08/04/2024   INR 2 08/26/2015   INR 2.8 07/29/2015    Assessment: Bradley Hansen presents to clinic today for HCV EOT follow-up. He has completed his 8 week course of Mavyret . He has missed doses of Mayvret due to being hospitalized on 8/8. Reports that he had a stroke on 8/8 and has not taken his medication since. His estimated last day of HepC treatment was 08/14/24, therefore he missed approximately 10 doses. He does have the medication at home and is willing to figure out the exact number of doses he missed. Will call patient later this afternoon to follow-up. He did tolerate  the medication well while on treatment. Last HCV RNA on 07/19/24, undetectable. Will check HCV RNA today and follow-up in 3 months for confirmatory testing with Dr. Overton.    Eligible for influenza (HD) and shingles vaccines. The clinic is currently out of stock of HD influenza vaccines, highly recommended that he gets it at a community pharmacy. Will provide shingles vaccine today.    Plan: Administered Shingrix  vaccine Check HCV  RNA today Will follow-up with patient this afternoon to verify number of doses missed Follow up with Dr. Overton on 12/04/24   Woodie Jock, PharmD PGY1 Pharmacy Resident  09/11/2024

## 2024-09-11 ENCOUNTER — Ambulatory Visit (INDEPENDENT_AMBULATORY_CARE_PROVIDER_SITE_OTHER): Admitting: Pharmacist

## 2024-09-11 ENCOUNTER — Other Ambulatory Visit: Payer: Self-pay | Admitting: Pharmacist

## 2024-09-11 ENCOUNTER — Other Ambulatory Visit: Payer: Self-pay

## 2024-09-11 ENCOUNTER — Telehealth: Payer: Self-pay

## 2024-09-11 DIAGNOSIS — Z23 Encounter for immunization: Secondary | ICD-10-CM | POA: Diagnosis not present

## 2024-09-11 DIAGNOSIS — B182 Chronic viral hepatitis C: Secondary | ICD-10-CM | POA: Diagnosis not present

## 2024-09-11 NOTE — Progress Notes (Signed)
 Patient was hospitalized in August and did not finish his Mavyret  course. He missed the last 12 days. Advised him to not take the remaining (he hasn't taken a dose in over a month) and that we will hope for the best regarding cure. Saw us  in clinic today, and we checked a HCV RNA. Will follow up in December with Dr. Overton for cure labs and assessment.  Lauralye Kinn L. Collie Wernick, PharmD, BCIDP, AAHIVP, CPP Infectious Diseases Clinical Pharmacist Practitioner Clinical Pharmacist Lead, Specialty Pharmacy [facility]

## 2024-09-11 NOTE — Telephone Encounter (Signed)
 Followed-up with Bradley Hansen over the phone regarding his previous missed doses of Mavyret . He states that he has 2 big packets remaining with one being unopened and the second one having four small packets. Based off of his account, he missed approximately 12 days of therapy. Reminded him to not take any further doses and completely stop the medication. Ensured him that we will follow-up in December with Dr. Earney Woodie Jock, PharmD PGY1 Pharmacy Resident  09/11/2024

## 2024-09-14 ENCOUNTER — Other Ambulatory Visit (HOSPITAL_COMMUNITY): Payer: Self-pay

## 2024-09-15 LAB — HEPATITIS C RNA QUANTITATIVE
HCV Quantitative Log: <1.18 {Log_IU}/mL
HCV RNA, PCR, QN: <15 [IU]/mL

## 2024-09-19 ENCOUNTER — Other Ambulatory Visit: Payer: Self-pay

## 2024-09-19 NOTE — Telephone Encounter (Signed)
 Sorry it is taking me so long to reply -- it was over a month since he took a dose

## 2024-09-19 NOTE — Telephone Encounter (Signed)
 Ok yeah lets just stop and check viral load in 3 month

## 2024-09-19 NOTE — Telephone Encounter (Signed)
 Sounds good. VL on 9/15 was undetectable which is good, but he sees you on 12/8 to recheck!

## 2024-10-03 ENCOUNTER — Other Ambulatory Visit: Payer: Self-pay

## 2024-12-04 ENCOUNTER — Ambulatory Visit: Admitting: Internal Medicine

## 2024-12-06 ENCOUNTER — Ambulatory Visit (INDEPENDENT_AMBULATORY_CARE_PROVIDER_SITE_OTHER): Admitting: Internal Medicine

## 2024-12-06 ENCOUNTER — Other Ambulatory Visit: Payer: Self-pay

## 2024-12-06 ENCOUNTER — Encounter: Payer: Self-pay | Admitting: Internal Medicine

## 2024-12-06 VITALS — BP 164/92 | HR 78 | Temp 98.5°F | Wt 167.0 lb

## 2024-12-06 DIAGNOSIS — I159 Secondary hypertension, unspecified: Secondary | ICD-10-CM

## 2024-12-06 DIAGNOSIS — R519 Headache, unspecified: Secondary | ICD-10-CM

## 2024-12-06 DIAGNOSIS — B182 Chronic viral hepatitis C: Secondary | ICD-10-CM | POA: Diagnosis not present

## 2024-12-06 MED ORDER — ACETAMINOPHEN 500 MG PO TABS: 1000.0000 mg | ORAL_TABLET | Freq: Three times a day (TID) | ORAL | 0 refills | Status: AC | PRN

## 2024-12-06 MED ORDER — IBUPROFEN 800 MG PO TABS: 800.0000 mg | ORAL_TABLET | Freq: Three times a day (TID) | ORAL | 0 refills | Status: DC | PRN

## 2024-12-06 MED ORDER — ACETAMINOPHEN 500 MG PO TABS: 1000.0000 mg | ORAL_TABLET | Freq: Three times a day (TID) | ORAL | 0 refills | Status: DC | PRN

## 2024-12-06 MED ORDER — IBUPROFEN 800 MG PO TABS: 800.0000 mg | ORAL_TABLET | Freq: Three times a day (TID) | ORAL | 0 refills | Status: AC | PRN

## 2024-12-06 NOTE — Progress Notes (Signed)
 [facility]  Reason for Consult:chronic hep c Referring Provider: Daphne Lesches, NP [facility])    Patient Active Problem List   Diagnosis Date Noted   Acute encephalopathy 08/05/2024   Altered mental status 08/05/2024   Expressive aphasia 08/04/2024   Essential hypertension 09/16/2023   Educated about COVID-19 virus infection 09/08/2020   Nonischemic cardiomyopathy (HCC) 09/08/2020   Atrial flutter, post operative 06/04/2014   S/P minimally invasive mitral valve repair 04/26/2014   Mitral valve prolapse 03/26/2014   Mitral regurgitation    ATHEROSCLEROSIS OF AORTA 12/01/2010   Backache 12/11/2009   Mitral valve disease 02/24/2007   GASTROESOPHAGEAL REFLUX, NO ESOPHAGITIS 02/24/2007      HPI: Bradley Hansen is a 73 y.o. male with GERD, nonischemic cardiomyopathy (02/2021 echo 50% ef, mild-mod concentric lvh), hx mvp and MR s/p minimally invasive mitral valve repair 2015, referred here by his pcp for chronic hep c management   I reviewed outside record and discussed with patient: Patient originally from [place]; immig[place] to [place]  [place] years ago 01/14/24 pcp visit Patient denied previous treatment  01/14/24 labs: Cr 0.84; ast 18; alt 21; alkphos 61; tbil 1.0 Cbc 3/13.7/197 Hep b sAg nonreactive Hcv rna 77.5k; hcv ab positive  Per epic review, patient was dxed at least since 2004: Hep c gt2a dx'ed 2004; seen by GI once in 2006 but lost to follow up  He was also seen at [facility] on 02/2023 for hep c treatment evaluation -- at that time no ascites, jaundice, encephaltopathy, hematemesis, melena/hematochezia. Colonoscopy 06/22/2022 diverticulosis and a subcentimeter tubular adenoma. He lost to follow up then too     Risk: No hx ivdu, indu, medical field job, kidney/dialysis issue, blood transfusion, tatoos. He doesn't know of anyone who had hep c and that he grew up with  He lives with his wife. He  doesn't know if his wife is positive   Cirrhosis finding: no hx n/v/hematemesis, bloody/black stool, abd distension, fluid withdrawal from abd/lungs, hx confusion, fatigue, edema, weight loss, poor apetite  Extra gi manifestation: no abnormal skin rash, fatigue, diffuse joint pain/swelling, hx kidney disease   Minimal etoh use   We reviewed: Natural history of hep c and its complications available treatment options for hepatitis C Other factors potentially worsening liver disease, including alcohol use; obesity; diabetes mellitus, and viral coinfection We discussed potential medications that can contribute to liver inflammation like acetaminophen , and to avoiding excessive amount (more than 3 gram daily use) acetaminophen     Potential for ddi/med list review No acid blocker He does take crestor  - he never had heart attack/stroke previously   No personal plan for any surgery  ------------ 03/21/24 id clinic f/u Patient had elastography done a week ago but not read yet I have spoken with our pharmacy team, plan to do mavyret  8 weeks for noncirrhotic patient No complaint today    12/06/24 id clinic f/u Today he has a headache. Onset today. 9/10. No sign of uri otherwise. No recent travel. No sick contact. No rash Patient does have chronic recurrent headache. He is not on any suppressive/preventative medication for headache Once every 2 months he would have headache. It last about a day or so Headache all around the head. No n/v/light-noise sensititivy He drinks tea every day 1 pack -- black tea No daily tylenol  or ibuprofen   No numbness/tingling/weakness  Headache not worse when he lays down  With regard  to hep c. Today is a 3 month post antiviral check  He is not on any phosphodiesterase inhibitor or nitroglycerin     Review of Systems: ROS All other ros negative      Past Medical History:  Diagnosis Date   Atrial flutter, post operative 06/04/2014   BACK  PAIN, CHRONIC 12/11/2009   Qualifier: Diagnosis of  By: Sharlet MD, James     Fibromyalgia    GASTROESOPHAGEAL REFLUX, NO ESOPHAGITIS 02/24/2007   Qualifier: Diagnosis of  By: Sharron Railing     HEPATITIS C 02/24/2007   Qualifier: Diagnosis of  By: Sharron Railing     Hx of echocardiogram    Echo (04/2014):  EF 45-50%, diff HK, mild AI, MV repair ok (mean 5 mmHg), mod LAE, mild RAE, mild late TV systolic prolapse   Hypercholesteremia    Leukopenia    Mitral regurgitation    Mitral valve prolapse    TEE  08/2010 severe MR, bileaflet MVP, EF 60%   Prostadynia    Prostatitis    Rhinitis 2011   chronic    S/P minimally invasive mitral valve repair 04/26/2014   Complex valvuloplasty including artificial Gore-tex neocord placement x8, bovine pericardial patch augmentation of P3 portion of posterior leaflet and 36 mm Sorin Memo 3D ring annuloplasty via right mini thoracotomy   Synovitis of hand 2011   right hand    Social History   Tobacco Use   Smoking status: Never   Smokeless tobacco: Never  Substance Use Topics   Alcohol use: Not Currently    Comment: week   Drug use: Never    Family History  Problem Relation Age of Onset   Heart attack Neg Hx    Cancer Neg Hx    Stroke Neg Hx     No Known Allergies  OBJECTIVE: Vitals:   12/06/24 1029  BP: (!) 164/92  Pulse: 78  Temp: 98.5 F (36.9 C)  TempSrc: Oral  SpO2: 96%  Weight: 167 lb (75.8 kg)   Body mass index is 28.67 kg/m.   Physical Exam General/constitutional: no distress, pleasant HEENT: Normocephalic, PER, Conj Clear, EOMI, Oropharynx clear Neck supple CV: rrr no mrg Lungs: clear to auscultation, normal respiratory effort Abd: Soft, Nontender Ext: no edema Skin: No Rash Neuro: nonfocal MSK: no peripheral joint swelling/tenderness/warmth; back spines nontender   Lab: Lab Results  Component Value Date   WBC 4.3 08/09/2024   HGB 13.1 08/09/2024   HCT 38.2 (L) 08/09/2024   MCV 88.6 08/09/2024   PLT  248 08/09/2024   Last metabolic panel Lab Results  Component Value Date   GLUCOSE 93 08/10/2024   NA 136 08/10/2024   K 4.4 08/10/2024   CL 102 08/10/2024   CO2 25 08/10/2024   BUN 15 08/10/2024   CREATININE 1.09 08/10/2024   GFRNONAA >60 08/10/2024   CALCIUM  9.4 08/10/2024   PROT 7.5 08/10/2024   ALBUMIN  3.5 08/10/2024   BILITOT 1.6 (H) 08/10/2024   ALKPHOS 58 08/10/2024   AST 24 08/10/2024   ALT 34 08/10/2024   ANIONGAP 9 08/10/2024    FIB 4 was just 1.46 (above 1.45 cut off for lack of advance fibrosis)  Microbiology:  Serology:  Imaging: Reviewed  03/13/24 elastography Pending read    09/2019 us  elastography liver only IMPRESSION: ULTRASOUND LIVER:   Unremarkable.   ULTRASOUND HEPATIC ELASTOGRAPHY:   Median kPa:  3.9   Diagnostic category:  High probability of being normal.   The use of hepatic elastography is applicable  to patients with viral hepatitis and non-alcoholic fatty liver disease. At this time, there is insufficient data for the referenced cut-off values and use in other causes of liver disease, including alcoholic liver disease. Patients, however, may be assessed by elastography and serve as their own reference standard/baseline.   In patients with non-alcoholic liver disease, the values suggesting compensated advanced chronic liver disease (cACLD) may be lower, and patients may need additional testing with elasticity results of 7-9 kPa.  Assessment/plan: Problem List Items Addressed This Visit   None       #chronic hep c Dx'ed 2004  Prior treatment: none GT: 2 Evidence of cirrhosis: not by exam/fib4 (1.46); awaiting elastography but likely no advance fibrosis (2020 elastography doesn't suggest advance fibrosis Interested in treatment yes Potential DDI: crestor  (we can hold or adjust dose as needed) --> will need to make sure crestor  dosing is 10 mg max daily Hepatitis b 01/2024 testing showed prior hep b infection,  functionally cured   -Imaging: elastography result pending -follow up: pharmacy will start [place] authorization and start mavyret ; I'll see no sooner than 3 months for hep c treatment virologic cure testing -Meds planned: 8 weeks mavyret  planned reduced crestor  dose to 5-10 mg daily while on treatment. He is not on any acid blocker or other medication with significant potential ddi    -discussed natural progression of hep c, transmission (avoid sharing personal hygiene equipment) -discussed avoid toxin like etoh and excessive acetamaminphen (no more than 2 gram a day) -discussed healthy life style and good glucose control -discussed avoiding eating raw sea food -discussed we can treat hep c but can be reinfected -discussed hepatitis coinfection and vaccination   -------------------------- 12/06/24 id clinic assessment #headache No red flag Sbp 160s doesn't appear to be hypertensive urgency Hx headache in past; no dx migraine and doesn't appear migraine No other medications that could cause headache  -abortive measure described --> rest, tylenol  (can be up to 1000 mg up to three times a day and alternate with ibuprofen  800 mg up to three times a day) -if numbness/tingling, nausea, worsening headache, confusion go to ER -f/u primary care doc in 1 week   #hep c Finished 8 weeks of mavyret  back in September 2025 3 months post medication SVR test today  Discussed with patient that with the cure his viral load will be negative, but the antibody is always positive. In future if anyone test it should be viral load      Follow-up: No follow-ups on file.  Bradley ONEIDA Passer, MD [facility] 12/06/2024, 10:41 AM

## 2024-12-06 NOTE — Patient Instructions (Signed)
-  abortive measure described --> rest, tylenol  (can be up to 1000 mg up to three times a day and alternate with ibuprofen  800 mg up to three times a day) -if numbness/tingling, nausea, worsening headache, confusion go to ER -f/u primary care doc in 1 week     For hep c Check viral load today -- if negative then you are cured Never check antibody again (it is always positive; if concern for reinfection, a viral load needs to be tested)    No need to make follow up with me (I'll call you if the viral load is positive still)

## 2024-12-06 NOTE — Addendum Note (Signed)
 Addended by: GRETEL TULLY HERO on: 12/06/2024 11:02 AM   Modules accepted: Orders

## 2024-12-08 LAB — HEPATITIS C RNA QUANTITATIVE
HCV Quantitative Log: <1.18 {Log_IU}/mL
HCV RNA, PCR, QN: <15 [IU]/mL

## 2024-12-13 ENCOUNTER — Ambulatory Visit: Payer: Self-pay | Admitting: Internal Medicine

## 2025-01-22 ENCOUNTER — Ambulatory Visit: Admitting: Neurology
